# Patient Record
Sex: Female | Born: 1955 | Race: White | Hispanic: No | Marital: Married | Smoking: Former smoker
Health system: Southern US, Community
[De-identification: ages and names within clinical notes are randomized; demographics above are authoritative.]

## PROBLEM LIST (undated history)

## (undated) DIAGNOSIS — J189 Pneumonia, unspecified organism: Secondary | ICD-10-CM

## (undated) DIAGNOSIS — Z9221 Personal history of antineoplastic chemotherapy: Secondary | ICD-10-CM

## (undated) DIAGNOSIS — M1990 Unspecified osteoarthritis, unspecified site: Secondary | ICD-10-CM

## (undated) DIAGNOSIS — Z923 Personal history of irradiation: Secondary | ICD-10-CM

## (undated) DIAGNOSIS — J00 Acute nasopharyngitis [common cold]: Secondary | ICD-10-CM

## (undated) DIAGNOSIS — Z87442 Personal history of urinary calculi: Secondary | ICD-10-CM

## (undated) DIAGNOSIS — C801 Malignant (primary) neoplasm, unspecified: Secondary | ICD-10-CM

## (undated) DIAGNOSIS — D649 Anemia, unspecified: Secondary | ICD-10-CM

## (undated) DIAGNOSIS — E039 Hypothyroidism, unspecified: Secondary | ICD-10-CM

## (undated) DIAGNOSIS — E079 Disorder of thyroid, unspecified: Secondary | ICD-10-CM

## (undated) DIAGNOSIS — K219 Gastro-esophageal reflux disease without esophagitis: Secondary | ICD-10-CM

## (undated) DIAGNOSIS — R51 Headache: Secondary | ICD-10-CM

## (undated) DIAGNOSIS — I10 Essential (primary) hypertension: Secondary | ICD-10-CM

## (undated) DIAGNOSIS — M810 Age-related osteoporosis without current pathological fracture: Secondary | ICD-10-CM

## (undated) DIAGNOSIS — F329 Major depressive disorder, single episode, unspecified: Secondary | ICD-10-CM

## (undated) DIAGNOSIS — N189 Chronic kidney disease, unspecified: Secondary | ICD-10-CM

## (undated) DIAGNOSIS — C50919 Malignant neoplasm of unspecified site of unspecified female breast: Secondary | ICD-10-CM

## (undated) DIAGNOSIS — F32A Depression, unspecified: Secondary | ICD-10-CM

## (undated) DIAGNOSIS — E785 Hyperlipidemia, unspecified: Secondary | ICD-10-CM

## (undated) HISTORY — DX: Malignant (primary) neoplasm, unspecified: C80.1

## (undated) HISTORY — PX: TONSILLECTOMY: SUR1361

## (undated) HISTORY — DX: Disorder of thyroid, unspecified: E07.9

## (undated) HISTORY — DX: Essential (primary) hypertension: I10

## (undated) HISTORY — DX: Age-related osteoporosis without current pathological fracture: M81.0

## (undated) HISTORY — DX: Hyperlipidemia, unspecified: E78.5

## (undated) HISTORY — DX: Anemia, unspecified: D64.9

## (undated) HISTORY — DX: Personal history of irradiation: Z92.3

---

## 2000-01-15 ENCOUNTER — Encounter: Admission: RE | Admit: 2000-01-15 | Discharge: 2000-01-15 | Payer: Self-pay | Admitting: *Deleted

## 2000-01-15 ENCOUNTER — Encounter: Payer: Self-pay | Admitting: *Deleted

## 2003-09-14 ENCOUNTER — Encounter (INDEPENDENT_AMBULATORY_CARE_PROVIDER_SITE_OTHER): Payer: Self-pay | Admitting: Specialist

## 2003-09-14 ENCOUNTER — Ambulatory Visit (HOSPITAL_COMMUNITY): Admission: RE | Admit: 2003-09-14 | Discharge: 2003-09-14 | Payer: Self-pay | Admitting: *Deleted

## 2005-10-10 ENCOUNTER — Ambulatory Visit (HOSPITAL_COMMUNITY): Admission: RE | Admit: 2005-10-10 | Discharge: 2005-10-10 | Payer: Self-pay | Admitting: *Deleted

## 2005-10-10 ENCOUNTER — Encounter (INDEPENDENT_AMBULATORY_CARE_PROVIDER_SITE_OTHER): Payer: Self-pay | Admitting: Specialist

## 2006-03-17 HISTORY — PX: OTHER SURGICAL HISTORY: SHX169

## 2007-05-06 ENCOUNTER — Encounter: Payer: Self-pay | Admitting: Family Medicine

## 2007-06-15 ENCOUNTER — Ambulatory Visit: Payer: Self-pay | Admitting: Family Medicine

## 2007-06-15 ENCOUNTER — Telehealth: Payer: Self-pay | Admitting: Family Medicine

## 2007-06-15 DIAGNOSIS — K219 Gastro-esophageal reflux disease without esophagitis: Secondary | ICD-10-CM | POA: Insufficient documentation

## 2007-06-15 DIAGNOSIS — N200 Calculus of kidney: Secondary | ICD-10-CM | POA: Insufficient documentation

## 2007-06-15 DIAGNOSIS — M79609 Pain in unspecified limb: Secondary | ICD-10-CM | POA: Insufficient documentation

## 2007-06-15 DIAGNOSIS — Q828 Other specified congenital malformations of skin: Secondary | ICD-10-CM | POA: Insufficient documentation

## 2007-06-15 DIAGNOSIS — J309 Allergic rhinitis, unspecified: Secondary | ICD-10-CM | POA: Insufficient documentation

## 2007-06-15 DIAGNOSIS — B351 Tinea unguium: Secondary | ICD-10-CM | POA: Insufficient documentation

## 2007-06-15 DIAGNOSIS — J45909 Unspecified asthma, uncomplicated: Secondary | ICD-10-CM | POA: Insufficient documentation

## 2007-06-15 DIAGNOSIS — K279 Peptic ulcer, site unspecified, unspecified as acute or chronic, without hemorrhage or perforation: Secondary | ICD-10-CM | POA: Insufficient documentation

## 2007-06-15 DIAGNOSIS — I10 Essential (primary) hypertension: Secondary | ICD-10-CM | POA: Insufficient documentation

## 2007-06-15 DIAGNOSIS — M1990 Unspecified osteoarthritis, unspecified site: Secondary | ICD-10-CM | POA: Insufficient documentation

## 2007-06-29 ENCOUNTER — Ambulatory Visit: Payer: Self-pay | Admitting: Family Medicine

## 2007-07-02 ENCOUNTER — Telehealth: Payer: Self-pay | Admitting: Family Medicine

## 2007-07-12 ENCOUNTER — Ambulatory Visit: Payer: Self-pay | Admitting: Family Medicine

## 2007-07-12 LAB — CONVERTED CEMR LAB
BUN: 15 mg/dL (ref 6–23)
Chloride: 109 meq/L (ref 96–112)
GFR calc non Af Amer: 70 mL/min
Potassium: 4.9 meq/L (ref 3.5–5.1)

## 2007-08-16 ENCOUNTER — Ambulatory Visit: Payer: Self-pay | Admitting: Family Medicine

## 2007-08-16 DIAGNOSIS — R5383 Other fatigue: Secondary | ICD-10-CM

## 2007-08-16 DIAGNOSIS — R5381 Other malaise: Secondary | ICD-10-CM | POA: Insufficient documentation

## 2007-08-18 LAB — CONVERTED CEMR LAB
Alkaline Phosphatase: 64 units/L (ref 39–117)
Basophils Absolute: 0 10*3/uL (ref 0.0–0.1)
Basophils Relative: 0.3 % (ref 0.0–1.0)
Bilirubin, Direct: 0.1 mg/dL (ref 0.0–0.3)
Calcium: 9.7 mg/dL (ref 8.4–10.5)
FSH: 70.4 milliintl units/mL
Folate: 14.8 ng/mL
GFR calc Af Amer: 84 mL/min
GFR calc non Af Amer: 69 mL/min
HDL: 41.1 mg/dL (ref >39.0)
LH: 40.3 milliintl units/mL
Lymphocytes Relative: 19.6 % (ref 12.0–46.0)
MCHC: 35.7 g/dL (ref 30.0–36.0)
Neutrophils Relative %: 68.9 % (ref 43.0–77.0)
Platelets: 224 10*3/uL (ref 150–400)
Potassium: 4.5 meq/L (ref 3.5–5.1)
RBC: 4.72 M/uL (ref 3.87–5.11)
RDW: 12.8 % (ref 11.5–14.6)
Sed Rate: 12 mm/hr (ref 0–22)
Sodium: 141 meq/L (ref 135–145)
Total Bilirubin: 1 mg/dL (ref 0.3–1.2)
Triglycerides: 238 mg/dL (ref 0–149)
VLDL: 48 mg/dL — ABNORMAL HIGH (ref 0–40)
Vitamin B-12: 277 pg/mL (ref 211–911)

## 2007-08-23 ENCOUNTER — Ambulatory Visit: Payer: Self-pay | Admitting: Family Medicine

## 2007-08-26 DIAGNOSIS — R946 Abnormal results of thyroid function studies: Secondary | ICD-10-CM | POA: Insufficient documentation

## 2007-08-26 LAB — CONVERTED CEMR LAB
Free T4: 0.7 ng/dL (ref 0.6–1.6)
T3, Free: 3.3 pg/mL (ref 2.3–4.2)

## 2007-09-10 ENCOUNTER — Encounter: Payer: Self-pay | Admitting: Family Medicine

## 2007-09-15 ENCOUNTER — Encounter: Admission: RE | Admit: 2007-09-15 | Discharge: 2007-09-15 | Payer: Self-pay | Admitting: Endocrinology

## 2007-09-20 ENCOUNTER — Telehealth: Payer: Self-pay | Admitting: Family Medicine

## 2008-04-06 ENCOUNTER — Ambulatory Visit: Payer: Self-pay | Admitting: Family Medicine

## 2008-04-06 ENCOUNTER — Telehealth: Payer: Self-pay | Admitting: Family Medicine

## 2008-04-06 DIAGNOSIS — N63 Unspecified lump in unspecified breast: Secondary | ICD-10-CM | POA: Insufficient documentation

## 2008-04-10 ENCOUNTER — Encounter: Admission: RE | Admit: 2008-04-10 | Discharge: 2008-04-10 | Payer: Self-pay | Admitting: Family Medicine

## 2008-04-11 ENCOUNTER — Encounter (INDEPENDENT_AMBULATORY_CARE_PROVIDER_SITE_OTHER): Payer: Self-pay | Admitting: *Deleted

## 2008-05-02 ENCOUNTER — Ambulatory Visit: Payer: Self-pay | Admitting: Family Medicine

## 2008-05-02 DIAGNOSIS — R0789 Other chest pain: Secondary | ICD-10-CM | POA: Insufficient documentation

## 2008-05-11 ENCOUNTER — Encounter (INDEPENDENT_AMBULATORY_CARE_PROVIDER_SITE_OTHER): Payer: Self-pay | Admitting: *Deleted

## 2008-05-19 ENCOUNTER — Encounter: Payer: Self-pay | Admitting: Family Medicine

## 2008-07-05 ENCOUNTER — Ambulatory Visit: Payer: Self-pay | Admitting: Family Medicine

## 2008-07-06 ENCOUNTER — Telehealth: Payer: Self-pay | Admitting: Family Medicine

## 2008-07-07 ENCOUNTER — Ambulatory Visit: Payer: Self-pay | Admitting: Family Medicine

## 2008-07-17 ENCOUNTER — Telehealth: Payer: Self-pay | Admitting: Family Medicine

## 2008-07-31 ENCOUNTER — Ambulatory Visit: Payer: Self-pay | Admitting: Family Medicine

## 2008-08-22 ENCOUNTER — Telehealth: Payer: Self-pay | Admitting: Family Medicine

## 2008-08-25 ENCOUNTER — Telehealth: Payer: Self-pay | Admitting: Family Medicine

## 2008-09-05 ENCOUNTER — Telehealth: Payer: Self-pay | Admitting: Family Medicine

## 2008-09-06 ENCOUNTER — Ambulatory Visit: Payer: Self-pay | Admitting: Family Medicine

## 2008-09-25 ENCOUNTER — Telehealth: Payer: Self-pay | Admitting: Family Medicine

## 2008-10-09 ENCOUNTER — Telehealth: Payer: Self-pay | Admitting: Family Medicine

## 2008-11-06 ENCOUNTER — Telehealth: Payer: Self-pay | Admitting: Family Medicine

## 2008-11-13 ENCOUNTER — Telehealth: Payer: Self-pay | Admitting: Family Medicine

## 2009-01-08 ENCOUNTER — Telehealth: Payer: Self-pay | Admitting: Family Medicine

## 2009-01-09 ENCOUNTER — Ambulatory Visit: Payer: Self-pay | Admitting: Family Medicine

## 2009-02-12 ENCOUNTER — Telehealth: Payer: Self-pay | Admitting: Family Medicine

## 2009-02-22 ENCOUNTER — Telehealth: Payer: Self-pay | Admitting: Family Medicine

## 2009-06-25 ENCOUNTER — Encounter: Admission: RE | Admit: 2009-06-25 | Discharge: 2009-06-25 | Payer: Self-pay | Admitting: Family Medicine

## 2009-09-24 ENCOUNTER — Ambulatory Visit: Payer: Self-pay | Admitting: Family Medicine

## 2009-09-24 DIAGNOSIS — M1710 Unilateral primary osteoarthritis, unspecified knee: Secondary | ICD-10-CM

## 2009-09-24 DIAGNOSIS — IMO0002 Reserved for concepts with insufficient information to code with codable children: Secondary | ICD-10-CM | POA: Insufficient documentation

## 2009-10-19 ENCOUNTER — Telehealth: Payer: Self-pay | Admitting: Family Medicine

## 2009-11-09 ENCOUNTER — Telehealth: Payer: Self-pay | Admitting: Family Medicine

## 2010-01-10 ENCOUNTER — Ambulatory Visit: Payer: Self-pay | Admitting: Family Medicine

## 2010-01-14 ENCOUNTER — Ambulatory Visit: Payer: Self-pay | Admitting: Family Medicine

## 2010-01-17 ENCOUNTER — Ambulatory Visit: Payer: Self-pay | Admitting: Family Medicine

## 2010-01-21 ENCOUNTER — Ambulatory Visit: Payer: Self-pay | Admitting: Family Medicine

## 2010-01-24 ENCOUNTER — Ambulatory Visit: Payer: Self-pay | Admitting: Family Medicine

## 2010-01-28 ENCOUNTER — Ambulatory Visit: Payer: Self-pay | Admitting: Family Medicine

## 2010-04-07 ENCOUNTER — Encounter: Payer: Self-pay | Admitting: Family Medicine

## 2010-04-08 ENCOUNTER — Encounter: Payer: Self-pay | Admitting: Endocrinology

## 2010-04-15 ENCOUNTER — Telehealth: Payer: Self-pay | Admitting: Family Medicine

## 2010-04-15 ENCOUNTER — Encounter: Payer: Self-pay | Admitting: Family Medicine

## 2010-04-16 NOTE — Miscellaneous (Signed)
Summary: Waiver of Liability for Injection  Waiver of Liability for Injection   Imported By: Maryln Gottron 01/16/2010 15:59:45  _____________________________________________________________________  External Attachment:    Type:   Image     Comment:   External Document

## 2010-04-16 NOTE — Assessment & Plan Note (Signed)
Summary: SYNVISC INJECTION CYD   Vital Signs:  Patient profile:   55 year old female Height:      62.5 inches Weight:      208.0 pounds BMI:     37.57 Temp:     98.6 degrees F oral Pulse rate:   80 / minute Pulse rhythm:   regular BP sitting:   110 / 78  (left arm) Cuff size:   large  Vitals Entered By: Benny Lennert CMA Duncan Dull) (January 24, 2010 9:07 AM)  History of Present Illness: Chief complaint synvisc Injection  Left knee    Allergies: 1)  ! Codeine   Impression & Recommendations:  Problem # 1:  OSTEOARTHRITIS, KNEE, LEFT (ICD-715.96)  Knee Injection: Synvisc-3, LEFT Patient verbally consented to procedure. Risks, benefits, and alternatives explained. Sterilely prepped with betadine. Ethyl cholride used for anesthesia, then 5 cc of Lidocaine 1% used for anesthesia in the anterolateral position. Reprepped with Betadine.  Anterolateral approach without difficulty, injected with Synvisc-3, 2 mL (16 mg).  No complications with procedure and tolerated well.   Her updated medication list for this problem includes:    Tramadol Hcl 200 Mg Xr24h-tab (Tramadol hcl) .Marland Kitchen... Take one tablet every 6 hours as needed    Aspir-low 81 Mg Tbec (Aspirin) ..... One tablet daily  Orders: Joint Aspirate / Injection, Large (16109) Synvisc-Three (16 units) (U0454)  Complete Medication List: 1)  Lisinopril 20 Mg Tabs (Lisinopril) .... Take 1 tablet by mouth every 12 hours 2)  Glucosamine-chondroitin Caps (Glucosamine-chondroit-vit c-mn) .... Takes 2 tablets by mouth daily 3)  Synthroid 100 Mcg Tabs (Levothyroxine sodium) .... One tablet daily 4)  Pravastatin Sodium 80 Mg Tabs (Pravastatin sodium) .... Take 1 tablet by mouth once a day 5)  Cymbalta 60 Mg Cpep (Duloxetine hcl) .... Take 2 tablet by mouth once a day 6)  Voltaren 1 % Gel (Diclofenac sodium) .... Apply to knee 4 times daily as needed 7)  Tramadol Hcl 200 Mg Xr24h-tab (Tramadol hcl) .... Take one tablet every 6 hours as  needed 8)  Aspir-low 81 Mg Tbec (Aspirin) .... One tablet daily 9)  Vesicare 5 Mg Tabs (Solifenacin succinate) .... Take 1 tablet by mouth once a day 10)  Muscle Relaxer Pt Not Know Name    Orders Added: 1)  Joint Aspirate / Injection, Large [20610] 2)  Synvisc-Three (16 units) [J7325]    Current Allergies (reviewed today): ! CODEINE

## 2010-04-16 NOTE — Miscellaneous (Signed)
Summary: Waiver of Liability for Injection  Waiver of Liability for Injection   Imported By: Maryln Gottron 01/21/2010 14:13:07  _____________________________________________________________________  External Attachment:    Type:   Image     Comment:   External Document

## 2010-04-16 NOTE — Assessment & Plan Note (Signed)
Summary: SHOT/JRR   Vital Signs:  Patient profile:   55 year old female Height:      62.5 inches Weight:      208.0 pounds BMI:     37.57 Temp:     98.6 degrees F oral BP sitting:   110 / 80  (left arm) Cuff size:   large  Vitals Entered By: Benny Lennert CMA Duncan Dull) (January 28, 2010 3:45 PM)  History of Present Illness: Chief complaint synvisc injection  Allergies: 1)  ! Codeine   Impression & Recommendations:  Problem # 1:  OSTEOARTHRITIS, KNEE, RIGHT (ICD-715.96) Knee Injection: Synvisc-3< RIGHT Patient verbally consented to procedure. Risks, benefits, and alternatives explained. Sterilely prepped with betadine. Ethyl cholride used for anesthesia, then 5 cc of Lidocaine 1% used for anesthesia in the anterolateral position. Reprepped with Betadine.  Anterolateral approach without difficulty, injected with Synvisc-3, 2 mL. No complications with procedure and tolerated well.   Her updated medication list for this problem includes:    Tramadol Hcl 200 Mg Xr24h-tab (Tramadol hcl) .Marland Kitchen... Take one tablet every 6 hours as needed    Aspir-low 81 Mg Tbec (Aspirin) ..... One tablet daily  Complete Medication List: 1)  Lisinopril 20 Mg Tabs (Lisinopril) .... Take 1 tablet by mouth every 12 hours 2)  Glucosamine-chondroitin Caps (Glucosamine-chondroit-vit c-mn) .... Takes 2 tablets by mouth daily 3)  Synthroid 100 Mcg Tabs (Levothyroxine sodium) .... One tablet daily 4)  Pravastatin Sodium 80 Mg Tabs (Pravastatin sodium) .... Take 1 tablet by mouth once a day 5)  Cymbalta 60 Mg Cpep (Duloxetine hcl) .... Take 2 tablet by mouth once a day 6)  Voltaren 1 % Gel (Diclofenac sodium) .... Apply to knee 4 times daily as needed 7)  Tramadol Hcl 200 Mg Xr24h-tab (Tramadol hcl) .... Take one tablet every 6 hours as needed 8)  Aspir-low 81 Mg Tbec (Aspirin) .... One tablet daily 9)  Vesicare 5 Mg Tabs (Solifenacin succinate) .... Take 1 tablet by mouth once a day 10)  Muscle Relaxer Pt Not  Know Name   Other Orders: Joint Aspirate / Injection, Large (20610) Synvisc-Three (16 units) (E4540)   Orders Added: 1)  Joint Aspirate / Injection, Large [20610] 2)  Synvisc-Three (16 units) [J7325]    Current Allergies (reviewed today): ! CODEINE

## 2010-04-16 NOTE — Assessment & Plan Note (Signed)
Summary: CORTIZON INJECTION/DLO   Vital Signs:  Patient profile:   55 year old female Height:      62.5 inches Weight:      217.0 pounds BMI:     39.20 Temp:     98.8 degrees F oral Pulse rate:   80 / minute Pulse rhythm:   regular BP sitting:   110 / 72  (left arm) Cuff size:   large  Vitals Entered By: Benny Lennert CMA Duncan Dull) (September 24, 2009 3:29 PM)  History of Present Illness: Chief complaint cortison injection in both knees  next is very friendly and 55 year old female with advanced arthritis in both knees with the left knee worse than the right..  She actually saw rheumatology about 6 weeks ago, at that point he did do feel that her knees,, and now have him to review, just elevated at least the right knee will have severe degenerative joint changes.  At this point she does have significant left-sided knee pain it is flared up, and some additional right-sided knee pain.  B knee inj  REVIEW OF SYSTEMS  GEN: No systemic complaints, no fevers, chills, sweats, or other acute illnesses MSK: Detailed in the HPI GI: tolerating PO intake without difficulty Neuro: No numbness, parasthesias, or tingling associated. Otherwise the pertinent positives of the ROS are noted above.     Allergies: 1)  ! Codeine  Physical Exam  Additional Exam:  GEN: Well-developed,well-nourished,in no acute distress; alert,appropriate and cooperative throughout examination HEENT: Normocephalic and atraumatic without obvious abnormalities. No apparent alopecia or balding. Ears, externally no deformities PULM: Breathing comfortably in no respiratory distress EXT: No clubbing, cyanosis, or edema PSYCH: Normally interactive. Cooperative during the interview. Pleasant. Friendly and conversant. Not anxious or depressed appearing. Normal, full affect.   bilaterally exam: Full extension bilaterally, left knee limited on  flexion to approximately 105 and on the right to approximately 115. Stable MCL and  LCL and examination. Negative Lockman examination. Negative anterior and posterior drawer test. The Murray's test is negative. Flexion pinched test is positive on both sides.   Both sides there is actually well preserved patellar mobility with some pain on patellar facet compression.   Impression & Recommendations:  Problem # 1:  OSTEOARTHRITIS, KNEE, LEFT (ICD-715.96) Assessment Deteriorated  left greater than right deteriorated osteoarthritic  changes in  both knees.  Patient is on acceptable  nonoperative  interventions including Mobic and Ultram ER  considerable pain currently. This is providing some functional limitation.  Patient would be a good candidate for hyaluronic acid injections, which could be considered in the future as well.  No knee inj in 1 year - reasonable to inject her knees. She understands that all of these are temporary solutions, but not at TKA level.  Knee Injection, L Patient verbally consented to procedure. Risks, benefits, and alternatives explained. Sterilely prepped with betadine. Ethyl cholride used for anesthesia. 9 cc 0.5% Marcaine mixed with 1 cc of Kenalog 40 mg injected using the anterolateral approach without difficulty. No complications with procedure and tolerated well. Patient had decreased pain post-injection.   Her updated medication list for this problem includes:    Meloxicam 7.5 Mg Tabs (Meloxicam) .Marland Kitchen... 1 tab by mouth two times a day    Tramadol Hcl 200 Mg Xr24h-tab (Tramadol hcl) .Marland Kitchen... Take one tablet daily    Aspir-low 81 Mg Tbec (Aspirin) ..... One tablet daily  Orders: Joint Aspirate / Injection, Large (20610) Kenalog 10mg  (4units) (J3301) Joint Aspirate / Injection, Large (20610) Kenalog  10mg  (4units) (J3301)  Problem # 2:  OSTEOARTHRITIS, KNEE, RIGHT (ICD-715.96) Assessment: Deteriorated Knee Injection, R Patient verbally consented to procedure. Risks, benefits, and alternatives explained. Sterilely prepped with betadine. Ethyl  cholride used for anesthesia. 9 cc 0.5% Marcaine mixed with 1 cc of Kenalog 40 mg injected using the anterolateral approach without difficulty. No complications with procedure and tolerated well. Patient had decreased pain post-injection.   Her updated medication list for this problem includes:    Meloxicam 7.5 Mg Tabs (Meloxicam) .Marland Kitchen... 1 tab by mouth two times a day    Tramadol Hcl 200 Mg Xr24h-tab (Tramadol hcl) .Marland Kitchen... Take one tablet daily    Aspir-low 81 Mg Tbec (Aspirin) ..... One tablet daily  Complete Medication List: 1)  Lisinopril 20 Mg Tabs (Lisinopril) .... Take 1 tablet by mouth every 12 hours 2)  Glucosamine-chondroitin Caps (Glucosamine-chondroit-vit c-mn) .... Takes 2 tablets by mouth daily 3)  Meloxicam 7.5 Mg Tabs (Meloxicam) .Marland Kitchen.. 1 tab by mouth two times a day 4)  Synthroid 100 Mcg Tabs (Levothyroxine sodium) .... One tablet daily 5)  Pravastatin Sodium 80 Mg Tabs (Pravastatin sodium) .... Take 1 tablet by mouth once a day 6)  Cymbalta 60 Mg Cpep (Duloxetine hcl) .... Take 2 tablet by mouth once a day 7)  Voltaren 1 % Gel (Diclofenac sodium) .... Apply to knee 4 times daily as needed 8)  Tramadol Hcl 200 Mg Xr24h-tab (Tramadol hcl) .... Take one tablet daily 9)  Aspir-low 81 Mg Tbec (Aspirin) .... One tablet daily  Current Allergies (reviewed today): ! CODEINE

## 2010-04-16 NOTE — Progress Notes (Signed)
Summary: refill request for zolpidem  Phone Note Refill Request Message from:  Fax from Pharmacy  Refills Requested: Medication #1:  zolpidem tartrate 10 mg   Last Refilled: 08/22/2008 Faxed request from [facility] [address], this is no longer on med list. [phone].  Initial call taken by: Lowella Petties CMA,  October 19, 2009 9:54 AM  Follow-up for Phone Call        OVer due for CPX.Marland Kitchenneeds to be seen  for CPXprior to refills.   LAbs prior Dx 401.1 CMET, lipids, TSH Follow-up by: Kerby Nora MD,  October 19, 2009 9:58 AM  Additional Follow-up for Phone Call Additional follow up Details #1::        Patient and pharmacy advised patient needs appt before furthur refills.Consuello Masse CMA   Additional Follow-up by: Benny Lennert CMA Duncan Dull),  October 19, 2009 10:58 AM

## 2010-04-16 NOTE — Miscellaneous (Signed)
Summary: Waiver of Liability Form,Synvisc Injection  Waiver of Liability Form,Synvisc Injection   Imported By: Beau Fanny 01/28/2010 16:38:00  _____________________________________________________________________  External Attachment:    Type:   Image     Comment:   External Document

## 2010-04-16 NOTE — Assessment & Plan Note (Signed)
Summary: SYNVISC INJECTION  CYD   Vital Signs:  Patient profile:   55 year old female Height:      62.5 inches Weight:      217.0 pounds BMI:     39.20 Temp:     98.9 degrees F oral Pulse rate:   80 / minute Pulse rhythm:   regular BP sitting:   110 / 80  (left arm) Cuff size:   large  Vitals Entered By: Benny Lennert CMA Duncan Dull) (January 14, 2010 9:09 AM)  History of Present Illness: Chief complaint synvisc Injection  Allergies: 1)  ! Codeine   Impression & Recommendations:  Problem # 1:  OSTEOARTHRITIS, KNEE, RIGHT (ICD-715.96) Knee Injection: Synvisc-3, RIGHT KNEE Patient verbally consented to procedure. Risks, benefits, and alternatives explained. Sterilely prepped with betadine. Ethyl cholride used for anesthesia, then 5 cc of Lidocaine 1% used for anesthesia in the anterolateral position. Reprepped with Betadine.  Anterolateral approach without difficulty, injected with Synvisc-3, 2 mL. No complications with procedure and tolerated well.   Her updated medication list for this problem includes:    Tramadol Hcl 200 Mg Xr24h-tab (Tramadol hcl) .Marland Kitchen... Take one tablet every 6 hours as needed    Aspir-low 81 Mg Tbec (Aspirin) ..... One tablet daily  Orders: Joint Aspirate / Injection, Large (16109) Synvisc-Three (16 units) (U0454)  Complete Medication List: 1)  Lisinopril 20 Mg Tabs (Lisinopril) .... Take 1 tablet by mouth every 12 hours 2)  Glucosamine-chondroitin Caps (Glucosamine-chondroit-vit c-mn) .... Takes 2 tablets by mouth daily 3)  Synthroid 100 Mcg Tabs (Levothyroxine sodium) .... One tablet daily 4)  Pravastatin Sodium 80 Mg Tabs (Pravastatin sodium) .... Take 1 tablet by mouth once a day 5)  Cymbalta 60 Mg Cpep (Duloxetine hcl) .... Take 2 tablet by mouth once a day 6)  Voltaren 1 % Gel (Diclofenac sodium) .... Apply to knee 4 times daily as needed 7)  Tramadol Hcl 200 Mg Xr24h-tab (Tramadol hcl) .... Take one tablet every 6 hours as needed 8)  Aspir-low 81  Mg Tbec (Aspirin) .... One tablet daily 9)  Vesicare 5 Mg Tabs (Solifenacin succinate) .... Take 1 tablet by mouth once a day 10)  Muscle Relaxer Pt Not Know Name   Patient Instructions: 1)  f/u thursday with me.   Orders Added: 1)  Joint Aspirate / Injection, Large [20610] 2)  Synvisc-Three (16 units) [J7325]    Current Allergies (reviewed today): ! CODEINE

## 2010-04-16 NOTE — Miscellaneous (Signed)
Summary: Waiver of Liability Form  Waiver of Liability Form   Imported By: Beau Fanny 01/10/2010 09:36:43  _____________________________________________________________________  External Attachment:    Type:   Image     Comment:   External Document

## 2010-04-16 NOTE — Progress Notes (Signed)
Summary: wants silicone injections  Phone Note Call from Patient Call back at Work Phone [phone]   Caller: Patient Call For: Dr. Patsy Lager Summary of Call: Pt states she is ready to procede with silicone injections in both knees and she is asking that we call her insurance company to see if this is covered.  She has [facility], phone is [phone], ID # [id number]. Initial call taken by: Lowella Petties CMA,  November 09, 2009 8:37 AM  Follow-up for Phone Call        Aram Beecham, can we check her coverage for Synvisc-3 injection series. (Bilateral 3 shot Synvisc series, [employer] should cover, but we need to check before ordering 2000 worth of medicine) Follow-up by: Hannah Beat MD,  November 09, 2009 8:46 AM

## 2010-04-16 NOTE — Assessment & Plan Note (Signed)
Summary: SYNVISC INJECTION CYD   Vital Signs:  Patient profile:   55 year old female Height:      62.5 inches Weight:      212.0 pounds BMI:     38.30 Temp:     98.8 degrees F oral Pulse rate:   80 / minute Pulse rhythm:   regular BP sitting:   110 / 80  (left arm) Cuff size:   large  Vitals Entered By: Benny Lennert CMA Duncan Dull) (January 21, 2010 9:26 AM)  History of Present Illness: Chief complaint synvisc Injection  Right knee:   Allergies: 1)  ! Codeine   Impression & Recommendations:  Problem # 1:  OSTEOARTHRITIS, KNEE, RIGHT (ICD-715.96) Knee Injection: Synvisc-3, RIGHT Patient verbally consented to procedure. Risks, benefits, and alternatives explained. Sterilely prepped with betadine. Ethyl cholride used for anesthesia, then 5 cc of Lidocaine 1% used for anesthesia in the anterolateral position. Reprepped with Betadine.  Anterolateral approach without difficulty, injected with Synvisc-3, 2 mL. No complications with procedure and tolerated well.   Her updated medication list for this problem includes:    Tramadol Hcl 200 Mg Xr24h-tab (Tramadol hcl) .Marland Kitchen... Take one tablet every 6 hours as needed    Aspir-low 81 Mg Tbec (Aspirin) ..... One tablet daily  Complete Medication List: 1)  Lisinopril 20 Mg Tabs (Lisinopril) .... Take 1 tablet by mouth every 12 hours 2)  Glucosamine-chondroitin Caps (Glucosamine-chondroit-vit c-mn) .... Takes 2 tablets by mouth daily 3)  Synthroid 100 Mcg Tabs (Levothyroxine sodium) .... One tablet daily 4)  Pravastatin Sodium 80 Mg Tabs (Pravastatin sodium) .... Take 1 tablet by mouth once a day 5)  Cymbalta 60 Mg Cpep (Duloxetine hcl) .... Take 2 tablet by mouth once a day 6)  Voltaren 1 % Gel (Diclofenac sodium) .... Apply to knee 4 times daily as needed 7)  Tramadol Hcl 200 Mg Xr24h-tab (Tramadol hcl) .... Take one tablet every 6 hours as needed 8)  Aspir-low 81 Mg Tbec (Aspirin) .... One tablet daily 9)  Vesicare 5 Mg Tabs (Solifenacin  succinate) .... Take 1 tablet by mouth once a day 10)  Muscle Relaxer Pt Not Know Name   Other Orders: Joint Aspirate / Injection, Large (20610) Synvisc-Three (16 units) (Z6109)   Orders Added: 1)  Joint Aspirate / Injection, Large [20610] 2)  Synvisc-Three (16 units) [J7325]    Current Allergies (reviewed today): ! CODEINE

## 2010-04-16 NOTE — Assessment & Plan Note (Signed)
Summary: INJECTION OF 1 KNEE CYD   Vital Signs:  Patient profile:   55 year old female Height:      62.5 inches Weight:      216.50 pounds BMI:     39.11 Temp:     98.5 degrees F oral Pulse rate:   80 / minute Pulse rhythm:   regular BP sitting:   122 / 76  (left arm) Cuff size:   large  Vitals Entered By: Lewanda Rife LPN (January 10, 2010 9:21 AM) CC: knee injection   Allergies: 1)  ! Codeine   Impression & Recommendations:  Problem # 1:  OSTEOARTHRITIS, KNEE, LEFT (ICD-715.96)  Knee Injection: Synvisc-3, L Patient verbally consented to procedure. Risks, benefits, and alternatives explained. Sterilely prepped with betadine. Ethyl cholride used for anesthesia, then 5 cc of Lidocaine 1% used for anesthesia in the anterolateral position. Reprepped with Betadine.  Anterolateral approach without difficulty, injected with Synvisc-3, 2 mL. No complications with procedure and tolerated well.   The following medications were removed from the medication list:    Meloxicam 7.5 Mg Tabs (Meloxicam) .Marland Kitchen... 1 tab by mouth two times a day Her updated medication list for this problem includes:    Tramadol Hcl 200 Mg Xr24h-tab (Tramadol hcl) .Marland Kitchen... Take one tablet every 6 hours as needed    Aspir-low 81 Mg Tbec (Aspirin) ..... One tablet daily  Orders: Joint Aspirate / Injection, Large (79892) Synvisc-Three (16 units) (J1941)  Complete Medication List: 1)  Lisinopril 20 Mg Tabs (Lisinopril) .... Take 1 tablet by mouth every 12 hours 2)  Glucosamine-chondroitin Caps (Glucosamine-chondroit-vit c-mn) .... Takes 2 tablets by mouth daily 3)  Synthroid 100 Mcg Tabs (Levothyroxine sodium) .... One tablet daily 4)  Pravastatin Sodium 80 Mg Tabs (Pravastatin sodium) .... Take 1 tablet by mouth once a day 5)  Cymbalta 60 Mg Cpep (Duloxetine hcl) .... Take 2 tablet by mouth once a day 6)  Voltaren 1 % Gel (Diclofenac sodium) .... Apply to knee 4 times daily as needed 7)  Tramadol Hcl 200 Mg Xr24h-tab  (Tramadol hcl) .... Take one tablet every 6 hours as needed 8)  Aspir-low 81 Mg Tbec (Aspirin) .... One tablet daily 9)  Vesicare 5 Mg Tabs (Solifenacin succinate) .... Take 1 tablet by mouth once a day 10)  Muscle Relaxer Pt Not Know Name    Orders Added: 1)  Joint Aspirate / Injection, Large [20610] 2)  Synvisc-Three (16 units) [J7325]   Immunization History:  Influenza Immunization History:    Influenza:  historical (12/08/2009)   Immunization History:  Influenza Immunization History:    Influenza:  Historical (12/08/2009)  Current Allergies (reviewed today): ! CODEINE

## 2010-04-16 NOTE — Assessment & Plan Note (Signed)
Summary: ROA FOR FOLLOW-UP/JRR   Vital Signs:  Patient profile:   55 year old female Height:      62.5 inches Weight:      214.0 pounds BMI:     38.66 Temp:     98.6 degrees F oral Pulse rate:   80 / minute Pulse rhythm:   regular BP sitting:   110 / 78  (left arm) Cuff size:   large  Vitals Entered By: Benny Lennert CMA Duncan Dull) (January 17, 2010 9:38 AM)  History of Present Illness: Chief complaint follow up injection in left  knee  Allergies: 1)  ! Codeine   Impression & Recommendations:  Problem # 1:  OSTEOARTHRITIS, KNEE, LEFT (ICD-715.96)  Knee Injection: Synvisc-3, LEFT Patient verbally consented to procedure. Risks, benefits, and alternatives explained. Sterilely prepped with betadine. Ethyl cholride used for anesthesia, then 5 cc of Lidocaine 1% used for anesthesia in the anterolateral position. Reprepped with Betadine.  Anterolateral approach without difficulty, injected with Synvisc-3, 2 mL. No complications with procedure and tolerated well.   Her updated medication list for this problem includes:    Tramadol Hcl 200 Mg Xr24h-tab (Tramadol hcl) .Marland Kitchen... Take one tablet every 6 hours as needed    Aspir-low 81 Mg Tbec (Aspirin) ..... One tablet daily  Orders: Joint Aspirate / Injection, Large (04540) Synvisc-Three (16 units) (J8119)  Complete Medication List: 1)  Lisinopril 20 Mg Tabs (Lisinopril) .... Take 1 tablet by mouth every 12 hours 2)  Glucosamine-chondroitin Caps (Glucosamine-chondroit-vit c-mn) .... Takes 2 tablets by mouth daily 3)  Synthroid 100 Mcg Tabs (Levothyroxine sodium) .... One tablet daily 4)  Pravastatin Sodium 80 Mg Tabs (Pravastatin sodium) .... Take 1 tablet by mouth once a day 5)  Cymbalta 60 Mg Cpep (Duloxetine hcl) .... Take 2 tablet by mouth once a day 6)  Voltaren 1 % Gel (Diclofenac sodium) .... Apply to knee 4 times daily as needed 7)  Tramadol Hcl 200 Mg Xr24h-tab (Tramadol hcl) .... Take one tablet every 6 hours as needed 8)   Aspir-low 81 Mg Tbec (Aspirin) .... One tablet daily 9)  Vesicare 5 Mg Tabs (Solifenacin succinate) .... Take 1 tablet by mouth once a day 10)  Muscle Relaxer Pt Not Know Name    Orders Added: 1)  Joint Aspirate / Injection, Large [20610] 2)  Synvisc-Three (16 units) [J7325]    Current Allergies (reviewed today): ! CODEINE

## 2010-04-19 NOTE — Miscellaneous (Signed)
Summary: Waiver of Liability for Injection  Waiver of Liability for Injection   Imported By: Maryln Gottron 01/31/2010 12:15:37  _____________________________________________________________________  External Attachment:    Type:   Image     Comment:   External Document

## 2010-04-24 NOTE — Progress Notes (Signed)
Summary: [facility]  Phone Note Other Incoming   Details for Reason: [facility] Summary of Call: Notify pt she is overdue for CPX and fasting labs prior if not done elsewhere. Please schedule. Initial call taken by: Kerby Nora MD,  April 15, 2010 10:22 AM  Follow-up for Phone Call        Left message on machine for patient to call and schedule this appt at her earliest convience.Consuello Masse CMA   Follow-up by: Benny Lennert CMA Duncan Dull),  April 15, 2010 10:31 AM

## 2010-06-03 ENCOUNTER — Ambulatory Visit (INDEPENDENT_AMBULATORY_CARE_PROVIDER_SITE_OTHER): Payer: 59 | Admitting: Family Medicine

## 2010-06-03 ENCOUNTER — Encounter: Payer: Self-pay | Admitting: Family Medicine

## 2010-06-03 DIAGNOSIS — R21 Rash and other nonspecific skin eruption: Secondary | ICD-10-CM | POA: Insufficient documentation

## 2010-06-13 NOTE — Assessment & Plan Note (Signed)
Summary: ?SHINGLES/CLE [facility]   Vital Signs:  Patient profile:   55 year old female Weight:      218 pounds Temp:     98.3 degrees F oral Pulse rate:   76 / minute Pulse rhythm:   regular BP sitting:   124 / 78  (left arm) Cuff size:   large  Vitals Entered By: Selena Batten Dance CMA Duncan Dull) (June 03, 2010 9:09 AM) CC: ? Shingles   History of Present Illness: CC: rash  monday of last week, started having R sided headache.  Pain achey, then and stabbing.  no vomiting, mild queasiness.  No vision changes.  No fevers/chills.  + photophobia.  tylenol didn't help HA.  no joint pains outside norm or spots inside mouth.    Wednesday morning had rash forehead.  over next few days started having rash come up over forehead and R scalp.  pain burning  Never had anything like this before.  no one else around her had similar rash.  no recent trip outside of country. +h/o chicken pox. +h/o vertigo.  not taking vesicare or cymbalta.  did recently start phentermine 2 wks ago per Dr. Juleen China (stopped this medicine last few days to see if causing, no improvement in rash)  Current Medications (verified): 1)  Lisinopril 20 Mg  Tabs (Lisinopril) .... Take 1 Tablet By Mouth Every 12 Hours 2)  Glucosamine-Chondroitin   Caps (Glucosamine-Chondroit-Vit C-Mn) .... Takes 2 Tablets By Mouth Daily 3)  Synthroid 100 Mcg Tabs (Levothyroxine Sodium) .... One Tablet Daily 4)  Pravastatin Sodium 80 Mg Tabs (Pravastatin Sodium) .... Take 1 Tablet By Mouth Once A Day 5)  Voltaren 1 %  Gel (Diclofenac Sodium) .... Apply To Knee 4 Times Daily As Needed 6)  Tramadol Hcl 200 Mg Xr24h-Tab (Tramadol Hcl) .... Take One Nightly For Oa 7)  Aspir-Low 81 Mg Tbec (Aspirin) .... One Tablet Daily 8)  Flexeril 5 Mg Tabs (Cyclobenzaprine Hcl) .Marland Kitchen.. 1 By Mouth Three Times A Day As Needed 9)  Phentermine Hcl 30 Mg Caps (Phentermine Hcl) .... Per Dr. Juleen China  Allergies: 1)  ! Codeine  Past History:  Past Medical History: Last updated:  06/15/2007 Osteoarthritis Hypertension GERD Asthma, mild intermittant Allergic rhinitis Peptic ulcer disease  Family History: Last updated: 06/15/2007 father: kidney stones, MI age 33 mother: lupus only child PGM: mouth cancer, CAD MGM: Connective tissue do MGF: CAD  Social History: Last updated: 06/15/2007 Occupation: clean houses  Married 2 kids Never Smoked Alcohol use-no Drug use-no Regular exercise-yes, walks 45 min daily Diet: loves carbs, chicken, tuna, loves diet soda  Review of Systems       per HPI  Physical Exam  General:  Well-developed,well-nourished,in no acute distress; alert,appropriate and cooperative throughout examination Head:  NCAT Eyes:  No corneal or conjunctival inflammation noted. EOMI. Perrla.  cornea clear.  limited funduscopic exam, no exudate, papilledema. Ears:  TMs clear bilaterally Nose:  nares clear bilaterally Mouth:  MMM, no pharyngeal erythema/exudates Neck:  no LAD Lungs:  Normal respiratory effort, chest expands symmetrically. Lungs are clear to auscultation, no crackles or wheezes. Heart:  Normal rate and regular rhythm. S1 and S2 normal without gallop, murmur, click, rub or other extra sounds. Skin:  R forehead with inflammatory papules with surrounding erythema, tender to palpation.  One over R brow, one R temple, one R hairline, a few R upper parietal scalp no vesicles.   Impression & Recommendations:  Problem # 1:  SKIN RASH (ICD-782.1) R forehead, prodrome of HA. ?  atypical shingles (not vesicular) treat as such with acyclovir, esp as along V1 distribution.  discussed ophtho referral, pt opts to wait.  will let us know immediately if any vision changes for urgent ophtho referral.  Complete Medication List: 1)  Lisinopril 20 Mg Tabs (Lisinopril) .... Take 1 tablet by mouth every 12 hours 2)  Glucosamine-chondroitin Caps (Glucosamine-chondroit-vit c-mn) .... Takes 2 tablets by mouth daily 3)  Synthroid 100 Mcg Tabs  (Levothyroxine sodium) .... One tablet daily 4)  Pravastatin Sodium 80 Mg Tabs (Pravastatin sodium) .... Take 1 tablet by mouth once a day 5)  Voltaren 1 % Gel (Diclofenac sodium) .... Apply to knee 4 times daily as needed 6)  Tramadol Hcl 200 Mg Xr24h-tab (Tramadol hcl) .... Take one nightly for oa 7)  Aspir-low 81 Mg Tbec (Aspirin) .... One tablet daily 8)  Flexeril 5 Mg Tabs (Cyclobenzaprine hcl) .Marland Kitchen.. 1 by mouth three times a day as needed 9)  Phentermine Hcl 30 Mg Caps (Phentermine hcl) .... Per dr. Juleen China 10)  Acyclovir 800 Mg Tabs (Acyclovir) .... Take one by mouth 5 times a day for 7 days  Patient Instructions: 1)  We think this may be shingles.  2)  treat with anti viral. 3)  let us know if any worsening rash or pain.  If any vision changes, you need to let us know to get you in to see eye doctor right away. Prescriptions: ACYCLOVIR 800 MG TABS (ACYCLOVIR) take one by mouth 5 times a day for 7 days  #35 x 0   Entered and Authorized by:   Eustaquio Boyden  MD   Signed by:   Eustaquio Boyden  MD on 06/03/2010   Method used:   Electronically to        [facility]  [address] (retail)       [address]       [place], [place]  [address]       Ph: [phone] or [phone]       Fax: [phone]   RxID:   [id number]    Orders Added: 1)  Est. Patient Level III [32355]    Current Allergies (reviewed today): ! CODEINE

## 2010-08-02 NOTE — Op Note (Signed)
NAME:  Sydney Ford, Sydney Ford                         ACCOUNT NO.:  [id number]   MEDICAL RECORD NO.:  [id number]                   PATIENT TYPE:  AMB   LOCATION:  SDC                                  [facility]ILITY:  [facility]   PHYSICIAN:  [place] B. Earlene Plater, M.D.               DATE OF BIRTH:  10/14/1955   DATE OF PROCEDURE:  09/14/2003  DATE OF DISCHARGE:                                 OPERATIVE REPORT   PREOPERATIVE DIAGNOSES:  Abnormal uterine bleeding, prolapse submucosal  fibroid.   POSTOPERATIVE DIAGNOSES:  Abnormal uterine bleeding, prolapse submucosal  fibroid.   PROCEDURE:  Exam under anesthesia, removal of submucosal fibroid,  hysteroscopy D&C with polyp removal.   SURGEON:  [place] B. Earlene Plater, M.D.   ANESTHESIA:  LMA   FINDINGS:  A 5 cm prolapsed submucous fibroid, shaggy endometrium on  hysteroscopy and 2 or 3 areas that appear consistent with endometrial  polyps.   FLUID DEFICIT:  25 mL sorbitol.   ESTIMATED BLOOD LOSS:  50 mL.   COMPLICATIONS:  None.   INDICATIONS FOR PROCEDURE:  The patient presented from Dr. Stephannie Peters  office with a 2 or 3 month history of heavy vaginal bleeding with associated  fist size clots and was noted to be anemic in the 7 range on recent  hemoglobin.  Was seen in the office yesterday and on pelvic exam was noted  to have a prolapsed submucosal fibroid. Given her severe anemia and the  obvious clinical findings, she presents for operative removal and further  diagnosis and treatment.   The patient was advised of the risks of surgery including infection,  bleeding, uterine perforation, damage to surrounding organs.   DESCRIPTION OF PROCEDURE:  The patient was taken to the operating room and  LMA general anesthesia obtained.  She was prepped and draped in a standard  fashion, bladder emptied with a red rubber catheter.  Examination under  anesthesia showed findings as outlined above. A weighted speculum inserted  and the fibroid grasped with a tooth  tenaculum.  It was fairly mobile with a  broad stalk approximately 2 cm in width at the stalk.  Prolene endoloops  were passed around the fibroid and cinched down for hemostasis. The fibroid  was injected with diluted Pitressin.  Despite good retraction, the base  could not be well visualized to safely resect the specimen in one piece. I  therefore morcellated the fibroid with a scalpel down to the base.  The  sutures came off along with the base.  It was inspected and was hemostatic.   The cervix was probed with a sound and the uterus was found to be anteverted  and normal size.  A paracervical block was placed with 20 mL of 1%  Nesacaine.  The cervix was gently dilated to a #21 to allow passage of the  diagnostic hysteroscope. This was inserted after being flushed with sorbitol  and the cavity inspected  with good uterine distention obtained.   The endometrium overall appeared shaggy and there were 2 or 3 areas that  looked like polyps.  These were removed with the Randall stone forceps. The  endometrium was then gently curetted and green texture noted throughout.  There was no active bleeding within the endometrium. The site of the fibroid  was again inspected and was hemostatic.   As there was no other active bleeding and no other pathology identified, the  procedure was terminated.   The patient tolerated the procedure well and there were no complications.  She was taken to the recovery room awake, alert in stable condition. All  counts were correct per the operating room staff.                                               Gerri Spore B. Earlene Plater, M.D.    WBD/MEDQ  D:  09/14/2003  T:  09/15/2003  Job:  [id number]   cc:   Talmadge Coventry, M.D.  [address]  [place]  [place] [address]  Fax: [phone]

## 2010-08-02 NOTE — Op Note (Signed)
NAMENOELIA, Sydney Ford               ACCOUNT NO.:  [id number]   MEDICAL RECORD NO.:  [id number]          PATIENT TYPE:  AMB   LOCATION:  SDC                           [facility]   PHYSICIAN:  [place] B. Earlene Plater, M.D.  DATE OF BIRTH:  10/04/1955   DATE OF PROCEDURE:  10/10/2005  DATE OF DISCHARGE:                                 OPERATIVE REPORT   PREOPERATIVE DIAGNOSIS:  Menorrhagia.   POSTOPERATIVE DIAGNOSIS:  Menorrhagia.   PROCEDURE:  Hysteroscopy and dilatation and curettage, NovaSure endometrial  ablation.   SURGEON:  Dr. Marina Gravel.   ASSISTANT:  None.   ANESTHESIA:  LMA general and 20 mL of 1% Nesacaine paracervical block.   SPECIMENS:  Endometrial curettings submitted to Pathology.   BLOOD LOSS:  Minimal.   FLUID DEFICIT:  85 mL lactated Ringer's.   COMPLICATIONS:  None.   INDICATIONS:  The patient with a history of heavy menstrual bleeding  requiring continuous Aygestin therapy.  History of previous prolapsed myoma.  Recent sonohystogram in the office showed no focal mass and preop  endometrial biopsy was benign.  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  Options discussed including hysterectomy versus NovaSure or Mirena  IUD.  The patient ultimately decided to proceed with NovaSure to minimize  recovery time and minimize invasiveness but proceed with a definitive  procedure.  The patient was advised of the risks of surgery including  infection, bleeding, uterine perforation, injury to tissue and surrounding  organs, fluid overload and approximately 15% chance of unsatisfactory  results.   PROCEDURE:  The patient was taken to the operating room and LMA general  anesthesia obtained.  She was prepped and draped in the standard fashion.  Bladder emptied with in and out cath.  Uterus was sounded to 10 cm.  The  cervical length was estimated at 4.5 cm and cervix dilated to #15.  The  diagnostic hysteroscope was inserted after being flushed with Ringer's.  Good uterine distention obtained.  Cavity showed no focal mass but an  overall proliferative appearance.  The endometrium was gently curetted and  specimen submitted to Pathology.   The cervix was dilated up to a #23 and the NovaSure device inserted to the  fundus and the Array deployed in standard fashion.  The cavity width was 5  cm.   The CO2 test was performed and passed.  The endometrium was ablated for 1  minute and 50 seconds.   The Array was retracted, device removed and hysteroscope re-inserted and  cavity inspected.  Good coverage obtained.  No active bleeding.   The instruments were removed, cervix hemostatic. The patient tolerated the  procedure without complications.  She was taken to recovery room awake,  alert in stable condition.      Gerri Spore B. Earlene Plater, M.D.  Electronically Signed     WBD/MEDQ  D:  10/10/2005  T:  10/10/2005  Job:  [id number]

## 2010-12-02 ENCOUNTER — Other Ambulatory Visit: Payer: Self-pay | Admitting: *Deleted

## 2010-12-02 MED ORDER — DICLOFENAC SODIUM 1 % TD GEL: 1.0000 "application " | Freq: Four times a day (QID) | TRANSDERMAL | Status: DC

## 2012-03-21 DIAGNOSIS — J00 Acute nasopharyngitis [common cold]: Secondary | ICD-10-CM

## 2012-03-21 HISTORY — DX: Acute nasopharyngitis (common cold): J00

## 2013-01-26 ENCOUNTER — Other Ambulatory Visit: Payer: Self-pay

## 2013-01-26 DIAGNOSIS — Z1231 Encounter for screening mammogram for malignant neoplasm of breast: Secondary | ICD-10-CM

## 2013-02-08 ENCOUNTER — Ambulatory Visit
Admission: RE | Admit: 2013-02-08 | Discharge: 2013-02-08 | Disposition: A | Discharge status: Home / Self Care | Payer: BC Managed Care – PPO | Source: Ambulatory Visit

## 2013-02-08 DIAGNOSIS — Z1231 Encounter for screening mammogram for malignant neoplasm of breast: Secondary | ICD-10-CM

## 2013-02-11 ENCOUNTER — Other Ambulatory Visit: Payer: Self-pay | Admitting: Endocrinology

## 2013-02-11 DIAGNOSIS — R928 Other abnormal and inconclusive findings on diagnostic imaging of breast: Secondary | ICD-10-CM

## 2013-02-28 ENCOUNTER — Other Ambulatory Visit: Payer: Self-pay | Admitting: Endocrinology

## 2013-02-28 ENCOUNTER — Ambulatory Visit
Admission: RE | Admit: 2013-02-28 | Discharge: 2013-02-28 | Disposition: A | Discharge status: Home / Self Care | Payer: BC Managed Care – PPO | Source: Ambulatory Visit | Attending: Endocrinology | Admitting: Endocrinology

## 2013-02-28 DIAGNOSIS — R928 Other abnormal and inconclusive findings on diagnostic imaging of breast: Secondary | ICD-10-CM

## 2013-02-28 DIAGNOSIS — N6310 Unspecified lump in the right breast, unspecified quadrant: Secondary | ICD-10-CM

## 2013-02-28 DIAGNOSIS — C50919 Malignant neoplasm of unspecified site of unspecified female breast: Secondary | ICD-10-CM

## 2013-02-28 HISTORY — DX: Malignant neoplasm of unspecified site of unspecified female breast: C50.919

## 2013-03-01 ENCOUNTER — Other Ambulatory Visit: Payer: Self-pay | Admitting: Endocrinology

## 2013-03-01 DIAGNOSIS — C50919 Malignant neoplasm of unspecified site of unspecified female breast: Secondary | ICD-10-CM

## 2013-03-07 ENCOUNTER — Telehealth (INDEPENDENT_AMBULATORY_CARE_PROVIDER_SITE_OTHER): Payer: Self-pay

## 2013-03-07 ENCOUNTER — Telehealth: Payer: Self-pay | Admitting: *Deleted

## 2013-03-07 NOTE — Telephone Encounter (Signed)
Called and spoke with patient.  Contact information given.  No questions or concerns at this time.

## 2013-03-07 NOTE — Telephone Encounter (Signed)
Returned pt's call. The pt wanted to know if Dr Dwain Sarna would still see her before she get's her breast MRI b/c the pt is really sick with a cold now. The pt doesn't know if she is going to be able to sit still for . In the mri machine so she is going to prob. Get the mri r/s to another date if ok with Dr Dwain Sarna. I checked with Dr Dwain Sarna and he said he would still see pt without the MRI being done first. The pt understands.

## 2013-03-08 ENCOUNTER — Ambulatory Visit
Admission: RE | Admit: 2013-03-08 | Discharge: 2013-03-08 | Disposition: A | Discharge status: Home / Self Care | Payer: BC Managed Care – PPO | Source: Ambulatory Visit | Attending: Endocrinology | Admitting: Endocrinology

## 2013-03-08 DIAGNOSIS — C50919 Malignant neoplasm of unspecified site of unspecified female breast: Secondary | ICD-10-CM

## 2013-03-08 MED ORDER — GADOBENATE DIMEGLUMINE 529 MG/ML IV SOLN
20.0000 mL | Freq: Once | INTRAVENOUS | Status: AC | PRN
  Administered 2013-03-08: 20 mL via INTRAVENOUS

## 2013-03-14 ENCOUNTER — Other Ambulatory Visit: Payer: Self-pay | Admitting: *Deleted

## 2013-03-14 ENCOUNTER — Ambulatory Visit (INDEPENDENT_AMBULATORY_CARE_PROVIDER_SITE_OTHER): Payer: BC Managed Care – PPO | Admitting: General Surgery

## 2013-03-14 ENCOUNTER — Encounter (INDEPENDENT_AMBULATORY_CARE_PROVIDER_SITE_OTHER): Payer: Self-pay | Admitting: General Surgery

## 2013-03-14 ENCOUNTER — Encounter: Payer: Self-pay | Admitting: *Deleted

## 2013-03-14 VITALS — BP 134/82 | HR 70 | Temp 97.8°F | Resp 16 | Ht 62.0 in | Wt 231.4 lb

## 2013-03-14 DIAGNOSIS — C50411 Malignant neoplasm of upper-outer quadrant of right female breast: Secondary | ICD-10-CM

## 2013-03-14 DIAGNOSIS — C50419 Malignant neoplasm of upper-outer quadrant of unspecified female breast: Secondary | ICD-10-CM

## 2013-03-14 DIAGNOSIS — Z171 Estrogen receptor negative status [ER-]: Secondary | ICD-10-CM | POA: Insufficient documentation

## 2013-03-14 NOTE — Progress Notes (Signed)
Patient ID: Sydney Ford, female   DOB: 03/24/1955, 57 y.o.   MRN: [id number]  Chief Complaint  Patient presents with  . Other    breast cancer    HPI Sydney Ford is a 57 y.o. female.  Referred by Dr Elizabeth Eagle HPI This is a 57-year-old female who noticed a right breast mass as well as some tugging several months ago. She then eventually underwent a right breast mammogram that showed a 2.2 x 2.4 x 1.4 cm mass. The left side is negative. Her nodes are negative by ultrasound. She has no real prior history of any breast complaints except for some bleeding from her right nipple in 1984 that was deemed to be negative. She has no family history of breast ovarian cancer. She then underwent an MRI which showed a 3x2 x 1.7 irregular enhancing mass in the upper outer right breast consistent with a biopsy-proven cancer. The left breast is normal. There are no abnormal appearing lymph nodes. She is a diffusely enlarged thyroid gland that is consistent with her diagnosis of Hashimoto's. She underwent a biopsy of the right sided breast lesion which shows a triple negative breast cancer with a proliferation marker of 99%. 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 She comes in today to discuss her options. Past Medical History  Diagnosis Date  . Anemia   . Cancer   . Hypertension   . Hyperlipidemia   . Thyroid disease   . Osteoporosis     Past Surgical History  Procedure Laterality Date  . Cesarean section  1987  . Tonsillectomy      Family History  Problem Relation Age of Onset  . Heart failure Mother   . Heart attack Father     Social History History  Substance Use Topics  . Smoking status: Former Smoker    Quit date: 03/17/1985  . Smokeless tobacco: Not on file  . Alcohol Use: Not on file    Allergies  Allergen Reactions  . Codeine     REACTION: Nausea    Current Outpatient Prescriptions  Medication Sig Dispense Refill  . ARMOUR THYROID 60 MG tablet       .  atorvastatin (LIPITOR) 20 MG tablet       . chlorpheniramine-HYDROcodone (TUSSIONEX) 10-8 MG/5ML LQCR       . diclofenac sodium (VOLTAREN) 1 % GEL Apply 1 application topically 4 (four) times daily.  500 g  11  . lisinopril (PRINIVIL,ZESTRIL) 20 MG tablet       . LORazepam (ATIVAN) 0.5 MG tablet       . meloxicam (MOBIC) 15 MG tablet       . sertraline (ZOLOFT) 50 MG tablet       . traMADol (ULTRAM) 50 MG tablet        No current facility-administered medications for this visit.    Review of Systems Review of Systems  Constitutional: Negative for fever, chills and unexpected weight change.  HENT: Positive for voice change. Negative for congestion, hearing loss, sore throat and trouble swallowing.   Eyes: Negative for visual disturbance.  Respiratory: Positive for cough. Negative for wheezing.   Cardiovascular: Negative for chest pain, palpitations and leg swelling.  Gastrointestinal: Negative for nausea, vomiting, abdominal pain, diarrhea, constipation, blood in stool, abdominal distention and anal bleeding.  Genitourinary: Negative for hematuria, vaginal bleeding and difficulty urinating.  Musculoskeletal: Positive for arthralgias.  Skin: Negative for rash and wound.  Neurological: Negative for seizures, syncope and headaches.  Hematological:   Negative for adenopathy. Does not bruise/bleed easily.  Psychiatric/Behavioral: Negative for confusion.    Blood pressure 134/82, pulse 70, temperature 97.8 F (36.6 C), resp. rate 16, height 5' 2" (1.575 m), weight 231 lb 6.4 oz (104.962 kg).  Physical Exam Physical Exam  Vitals reviewed. Constitutional: She appears well-developed and well-nourished.  Cardiovascular: Normal rate, regular rhythm and normal heart sounds.   Pulmonary/Chest: Effort normal and breath sounds normal. She has no wheezes. She has no rales. Right breast exhibits mass. Right breast exhibits no inverted nipple, no nipple discharge, no skin change and no tenderness.  Left breast exhibits no inverted nipple, no mass, no nipple discharge, no skin change and no tenderness.    Lymphadenopathy:    She has no cervical adenopathy.    She has no axillary adenopathy.       Right: No supraclavicular adenopathy present.       Left: No supraclavicular adenopathy present.    Data Reviewed BILATERAL BREAST MRI WITH AND WITHOUT CONTRAST  LABS: BUN and creatinine were obtained on site at [facility] at  [address]  Results: BUN 15 mg/dL, Creatinine 0.9 mg/dL.  TECHNIQUE:  Multiplanar, multisequence MR images of both breasts were obtained  prior to and following the intravenous administration of 20ml of  MultiHance.  THREE-DIMENSIONAL MR IMAGE RENDERING ON INDEPENDENT WORKSTATION:  Three-dimensional MR images were rendered by post-processing of the  original MR data on an independent workstation. The  three-dimensional MR images were interpreted, and findings are  reported in the following complete MRI report for this study. Three  dimensional images were evaluated at the independent DynaCad  workstation  COMPARISON: Previous exams  FINDINGS:  Breast composition: c: Heterogeneous fibroglandular tissue  Background parenchymal enhancement: Mild  Right breast: A 3 x 2 x 1.7 cm irregular enhancing mass is  identified in the upper-outer right breast, consistent with the  biopsy-proven invasive mammary carcinoma. The mass is associated  with mixed kinetics, which are predominantly washout and plateau.  Susceptibility artifact is identified within the mass, consistent  with the biopsy site marker. No additional areas of abnormal  enhancement.  Left breast: No mass or abnormal enhancement.  Lymph nodes: No abnormal appearing lymph nodes.  Ancillary findings: A diffusely enlarged thyroid gland is  identified. This was noted on a previous thyroid ultrasound in 2009.  Additionally, there is a T2 hyperintense lesion in close  approximation to the  thyroid isthmus. This measures 1.6 x 1.4 x 0.6  cm.  IMPRESSION:  1. A 3 x 2 x 1.7 cm irregularly enhancing mass in the upper-outer  right breast, consistent with the biopsy-proven invasive mammary  carcinoma.  2. T2 hyperintense lesion in close approximation to the thyroid  isthmus. This measures up to 1.6 cm. The thyroid is diffusely  enlarged, as seen on a prior thyroid ultrasound.  RECOMMENDATION:  Treatment planning for biopsy-proven right breast carcinoma. Thyroid  ultrasound is recommended for T2 hyperintense lesion in close  approximation to the thyroid isthmus.   Assessment    Clinical stage II right breast cancer, triple negative     Plan    I think that the best plan for her would be primary systemic chemotherapy we discussed the indications for that would be decreased size and make this more amenable to a lumpectomy, assess response of her tumor in situ, and maybe even better therapy for her triple negative for tumor to begin with. I told her that we would decide what surgery we   would need to do at the completion of her chemotherapy which would entail another MRI. We discussed the staging and pathophysiology of breast cancer. We discussed all of the different options for treatment for breast cancer including surgery, chemotherapy, radiation therapy, Herceptin, and antiestrogen therapy. She is not a candidate for last two.  I provided pathology copy.  We discussed a sentinel lymph node biopsy as she does not appear to having lymph node involvement right now. We discussed the performance of that with injection of radioactive tracer and blue dye.  We discussed that there is probably about a 20% chance of having a positive node with a sentinel lymph node biopsy. I think right now a mastectomy might be better option although could probably due a lumpectomy with decent result.  I think this could be made better with primary chemotherapy with no disadvantage in terms of cancer  treatment. She will see Dr Magrinat tomorrow.          Judyann Casasola 03/14/2013, 3:12 PM    

## 2013-03-14 NOTE — Addendum Note (Signed)
Addended byIgnacia Marvel on: 03/14/2013 03:45 PM   Modules accepted: Orders

## 2013-03-14 NOTE — Progress Notes (Signed)
Sydney Ford enter labs.  I completed chart and placed in Dr. Darrall Dears box.

## 2013-03-14 NOTE — Progress Notes (Signed)
Received request from [facility] to add pt to see Dr. Darnelle Catalan tomorrow 12/30 at 9/9:30.  Unable to make the appt for Dr. Darnelle Catalan so I emailed Tiffany M. To enter the appt.  Unable to mail before appt letter - Dr. Dwain Sarna to give pt the information.  Unable to mail the Intake form - placed a note in EPIC for one to be given at time of check in.  Made chart.

## 2013-03-15 ENCOUNTER — Telehealth: Payer: Self-pay | Admitting: Oncology

## 2013-03-15 ENCOUNTER — Encounter: Payer: Self-pay | Admitting: *Deleted

## 2013-03-15 ENCOUNTER — Telehealth: Payer: Self-pay | Admitting: *Deleted

## 2013-03-15 ENCOUNTER — Ambulatory Visit: Payer: BC Managed Care – PPO

## 2013-03-15 ENCOUNTER — Ambulatory Visit (HOSPITAL_BASED_OUTPATIENT_CLINIC_OR_DEPARTMENT_OTHER): Payer: BC Managed Care – PPO | Admitting: Oncology

## 2013-03-15 ENCOUNTER — Other Ambulatory Visit (HOSPITAL_BASED_OUTPATIENT_CLINIC_OR_DEPARTMENT_OTHER): Payer: BC Managed Care – PPO

## 2013-03-15 ENCOUNTER — Encounter (INDEPENDENT_AMBULATORY_CARE_PROVIDER_SITE_OTHER): Payer: Self-pay

## 2013-03-15 VITALS — BP 154/94 | HR 83 | Temp 98.9°F | Resp 18 | Ht 62.0 in | Wt 229.3 lb

## 2013-03-15 DIAGNOSIS — Z171 Estrogen receptor negative status [ER-]: Secondary | ICD-10-CM

## 2013-03-15 DIAGNOSIS — C50411 Malignant neoplasm of upper-outer quadrant of right female breast: Secondary | ICD-10-CM

## 2013-03-15 DIAGNOSIS — C50419 Malignant neoplasm of upper-outer quadrant of unspecified female breast: Secondary | ICD-10-CM

## 2013-03-15 LAB — CBC WITH DIFFERENTIAL/PLATELET
BASO%: 0.7 % (ref 0.0–2.0)
HGB: 15.6 g/dL (ref 11.6–15.9)
LYMPH%: 22.7 % (ref 14.0–49.7)
MCHC: 34.7 g/dL (ref 31.5–36.0)
MCV: 88.2 fL (ref 79.5–101.0)
MONO#: 0.5 10*3/uL (ref 0.1–0.9)
MONO%: 7.8 % (ref 0.0–14.0)
NEUT#: 4.2 10*3/uL (ref 1.5–6.5)
NEUT%: 66.3 % (ref 38.4–76.8)
Platelets: 205 10*3/uL (ref 145–400)
RBC: 5.09 10*6/uL (ref 3.70–5.45)
RDW: 13.2 % (ref 11.2–14.5)
WBC: 6.3 10*3/uL (ref 3.9–10.3)

## 2013-03-15 LAB — COMPREHENSIVE METABOLIC PANEL (CC13)
ALT: 29 U/L (ref 0–55)
AST: 19 U/L (ref 5–34)
Albumin: 4 g/dL (ref 3.5–5.0)
Alkaline Phosphatase: 85 U/L (ref 40–150)
Anion Gap: 14 mEq/L — ABNORMAL HIGH (ref 3–11)
CO2: 24 mEq/L (ref 22–29)
Potassium: 4.3 mEq/L (ref 3.5–5.1)
Sodium: 141 mEq/L (ref 136–145)
Total Bilirubin: 0.86 mg/dL (ref 0.20–1.20)
Total Protein: 7.4 g/dL (ref 6.4–8.3)

## 2013-03-15 MED ORDER — PROCHLORPERAZINE MALEATE 10 MG PO TABS: 10.0000 mg | ORAL_TABLET | Freq: Four times a day (QID) | ORAL | Status: DC | PRN

## 2013-03-15 MED ORDER — DEXAMETHASONE 4 MG PO TABS: ORAL_TABLET | ORAL | Status: DC

## 2013-03-15 MED ORDER — LIDOCAINE-PRILOCAINE 2.5-2.5 % EX CREA: 1.0000 "application " | TOPICAL_CREAM | CUTANEOUS | Status: DC | PRN

## 2013-03-15 MED ORDER — ONDANSETRON HCL 8 MG PO TABS: 8.0000 mg | ORAL_TABLET | Freq: Two times a day (BID) | ORAL | Status: DC | PRN

## 2013-03-15 MED ORDER — LORAZEPAM 0.5 MG PO TABS: 0.5000 mg | ORAL_TABLET | Freq: Four times a day (QID) | ORAL | Status: DC | PRN

## 2013-03-15 NOTE — Telephone Encounter (Signed)
, °

## 2013-03-15 NOTE — Telephone Encounter (Signed)
Per staff message and POF I have scheduled appts.  JMW  

## 2013-03-15 NOTE — Progress Notes (Signed)
[name]  [phone] [phone]     ID: Sydney Ford OB: March 09, 1956  MR#: [id number]  JYN#:[id number]  PCP: Michiel Sites, MD GYN:   SU: Emelia Loron OTHER MD: Cain Saupe  CHIEF COMPLAINT: "I have breast cancer"  HISTORY OF PRESENT ILLNESS: Sydney Ford (pronounced "line 'em") herself noted a change in her right breast sometime October 2014 but "my breasts are like bean bags" and she initially ignored it. After while she began to feel a pole when she lay on her right side at night so she scheduled herself for screening mammography at the breast Center 02/08/2013 (prior mammogram had been April 2011), and this did show a possible mass in the right breast. Unilateral right diagnostic mammography and ultrasonography 02/28/2013 confirmed an irregular mass in the right breast upper outer quadrant measuring 2.4 cm. This was palpable at the 11:00 position. Ultrasound showed an irregular hypoechoic mass measuring 1.5 cm with no abnormal adenopathy in the right axilla.  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  The patient met with Dr. Dwain Sarna 03/14/2013 and he recommended primary systemic chemotherapy to decrease the size of the tumor and increase the chance of lumpectomy. The patient's subsequent history is as detailed below  INTERVAL HISTORY: Pen was evaluated in the breast clinic 03/15/2013 accompanied by her husband Sydney Ford.  REVIEW OF SYSTEMS: Aside from the new right breast lump, her review of systems is significant for mild hoarseness and a recent upper respiratory infection. She tells me she usually sleeps on 1-1/2 pillows. She has chronic low back pain which doesn't keep her from working 5 days a week. She also has "arthritis  everywhere" including her hands and wrists. She has a history of depression, but denies anxiety, phobia as, or suicidal thoughts. She is moderate GERD symptoms. She tells me she has asthma which he controls "with no metastases". 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 She denies chest pain or pressure. There have been no palpitations or murmurs. Abdomen no unusual headaches, visual changes, dizziness, gait imbalance, or nausea. A detailed review of systems today was otherwise noncontributory  PAST MEDICAL HISTORY: Past Medical History  Diagnosis Date  . Anemia   . Cancer   . Hypertension   . Hyperlipidemia   . Thyroid disease   . Osteoporosis     PAST SURGICAL HISTORY: Past Surgical History  Procedure Laterality Date  . Cesarean section  1987  . Tonsillectomy      FAMILY HISTORY Family History  Problem Relation Age of Onset  . Heart failure Mother   . Heart attack Father    the patient's father died at the age of 40 from a myocardial infarction. The patient's mother died at the age of 6 with congestive heart failure. She also had a history of lupus. The patient had no brothers or sisters. There is no history of breast or ovarian cancer in the family to her knowledge.  GYNECOLOGIC HISTORY:  Menarche age 87, first live birth age 48. She is GX P2. She underwent endometrial ablation in 2009 and has not had a period since that time. She never took hormone replacement.  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 She does not work for a company. Her husband Sydney Ford "Sydney Ford" Warner Mccreedy is semiretired. He works for a company that [employer] data on various goods. Son Sydney Ford is in [place] and works in Holiday representative. Son Sydney Ford lives  at home and is disabled with a diagnosis of paranoid schizophrenia. The patient has no grandchildren. She is not a church attender    ADVANCED DIRECTIVES: Not in place   HEALTH MAINTENANCE: History  Substance Use Topics  . Smoking status: Former Smoker    Quit date: 03/17/1985  . Smokeless tobacco: Not on file  . Alcohol Use: Not on file     Colonoscopy: Never  PAP: Remote  Bone density: November 2014, showed osteoporosis according to the patient's report  Lipid panel:  Allergies  Allergen Reactions  . Codeine     REACTION: Nausea    Current Outpatient Prescriptions  Medication Sig Dispense Refill  . ARMOUR THYROID 60 MG tablet       . atorvastatin (LIPITOR) 20 MG tablet       . lisinopril (PRINIVIL,ZESTRIL) 20 MG tablet       . meloxicam (MOBIC) 15 MG tablet       . sertraline (ZOLOFT) 50 MG tablet       . dexamethasone (DECADRON) 4 MG tablet Take 2 tablets by mouth once a day on the day after chemotherapy and then take 2 tablets two times a day for 2 days. Take with food.  30 tablet  1  . diclofenac sodium (VOLTAREN) 1 % GEL Apply 1 application topically 4 (four) times daily.  500 g  11  . LORazepam (ATIVAN) 0.5 MG tablet       . LORazepam (ATIVAN) 0.5 MG tablet Take 1 tablet (0.5 mg total) by mouth every 6 (six) hours as needed (Nausea or vomiting).  30 tablet  0  . ondansetron (ZOFRAN) 8 MG tablet Take 1 tablet (8 mg total) by mouth 2 (two) times daily as needed. Take two times a day as needed for nausea or vomiting starting on the third day after chemotherapy.  30 tablet  1  . prochlorperazine (COMPAZINE) 10 MG tablet Take 1 tablet (10 mg total) by mouth every 6 (six) hours as needed (Nausea or vomiting).  30 tablet  1   No current facility-administered medications for this visit.    OBJECTIVE: Middle-aged white woman in no acute distress Filed Vitals:   03/15/13 0930  BP: 154/94  Pulse: 83  Temp: 98.9 F (37.2 C)  Resp: 18     Body mass index is 41.93 kg/(m^2).     ECOG FS:0 - Asymptomatic  Ocular: Sclerae unicteric, pupils equal, round and reactive to light Ear-nose-throat: Oropharynx clear, no thrush or other lesions Lymphatic: No cervical or supraclavicular adenopathy Lungs no rales or rhonchi, good excursion bilaterally Heart regular rate and rhythm, no murmur appreciated Abd soft, nontender, positive bowel sounds MSK no focal spinal tenderness, no joint edema Neuro: non-focal, well-oriented, appropriate affect Breasts: The right breast is status post recent biopsy and there is a minimal ecchymosis in the upper outer quadrant. The mass in the right upper quadrant is palpable, movable, and nontender. The size is difficult to estimate by palpation, about 2 cm. There are no skin or nipple changes of concern. The right axilla is entirely benign. The left breast is unremarkable   LAB  RESULTS:  CMP     Component Value Date/Time   NA 141 03/15/2013 0909   NA 141 08/16/2007 0833   K 4.3 03/15/2013 0909   K 4.5 08/16/2007 0833   CL 105 08/16/2007 0833   CO2 24 03/15/2013 0909   CO2 27 08/16/2007 0833   GLUCOSE 102 03/15/2013 0909   GLUCOSE 105* 08/16/2007 0833   BUN 13.4 03/15/2013 0909   BUN 14 08/16/2007 0833   CREATININE 0.9 03/15/2013 0909   CREATININE 0.9 08/16/2007 0833   CALCIUM 9.8 03/15/2013 0909   CALCIUM 9.7 08/16/2007 0833   PROT 7.4 03/15/2013 0909   PROT 7.5 08/16/2007 0833   ALBUMIN 4.0 03/15/2013 0909   ALBUMIN 4.1 08/16/2007 0833   AST 19 03/15/2013 0909   AST 25 08/16/2007 0833   ALT 29 03/15/2013 0909   ALT 30 08/16/2007 0833   ALKPHOS 85 03/15/2013 0909   ALKPHOS 64 08/16/2007 0833   BILITOT 0.86 03/15/2013 0909   BILITOT 1.0 08/16/2007 0833   GFRNONAA 69 08/16/2007 0833   GFRAA 84 08/16/2007 0833    I No results found for this basename: SPEP,  UPEP,   kappa and lambda light chains    Lab Results  Component Value Date   WBC 6.3 03/15/2013   NEUTROABS 4.2 03/15/2013   HGB 15.6 03/15/2013   HCT 44.9 03/15/2013   MCV 88.2 03/15/2013    PLT 205 03/15/2013      Chemistry      Component Value Date/Time   NA 141 03/15/2013 0909   NA 141 08/16/2007 0833   K 4.3 03/15/2013 0909   K 4.5 08/16/2007 0833   CL 105 08/16/2007 0833   CO2 24 03/15/2013 0909   CO2 27 08/16/2007 0833   BUN 13.4 03/15/2013 0909   BUN 14 08/16/2007 0833   CREATININE 0.9 03/15/2013 0909   CREATININE 0.9 08/16/2007 0833      Component Value Date/Time   CALCIUM 9.8 03/15/2013 0909   CALCIUM 9.7 08/16/2007 0833   ALKPHOS 85 03/15/2013 0909   ALKPHOS 64 08/16/2007 0833   AST 19 03/15/2013 0909   AST 25 08/16/2007 0833   ALT 29 03/15/2013 0909   ALT 30 08/16/2007 0833   BILITOT 0.86 03/15/2013 0909   BILITOT 1.0 08/16/2007 0833       No results found for this basename: LABCA2    No components found with this basename: LABCA125    No results found for this basename: INR,  in the last 168 hours  Urinalysis No results found for this basename: colorurine,  appearanceur,  labspec,  phurine,  glucoseu,  hgbur,  bilirubinur,  ketonesur,  proteinur,  urobilinogen,  nitrite,  leukocytesur    STUDIES: US Breast Right  02/28/2013   CLINICAL DATA:  Patient returns for evaluation of a possible mass in the right upper outer quadrant noted on recent screening study dated 02/08/2013.  EXAM: DIGITAL DIAGNOSTIC  RIGHT MAMMOGRAM  ULTRASOUND RIGHT BREAST  COMPARISON:  06/25/2009, 04/10/2008  ACR Breast Density Category b: There are scattered areas of fibroglandular density.  FINDINGS: Additional views confirm the presence of an irregular mass in the right mid upper outer quadrant. This measures approximately 2.2 x 2.4 x 1.4 cm mammographically.  On physical exam I palpate a 1.5 cm mass at 11 o'clock 5 cm from the right nipple.  Ultrasound is performed, showing an irregular hypoechoic mass at 11 o'clock 5 cm from the right nipple measuring 1.5 cm. No abnormal lymph nodes are noted  in the right axilla.  The appearance is highly suspicious for invasive mammary carcinoma. Biopsy is  suggested. Ultrasound-guided core needle biopsy was discussed with the patient and she agreed with this plan.  IMPRESSION: Highly suspicious irregular mass at 11 o'clock 5 cm from the right nipple.  RECOMMENDATION: Ultrasound-guided core needle biopsy is suggested. This will be performed and reported separately.  I have discussed the findings and recommendations with the patient. Results were also provided in writing at the conclusion of the visit. If applicable, a reminder letter will be sent to the patient regarding the next appointment.  Report was telephoned to [facility], Dr. Marylen Ponto nurse.  BI-RADS CATEGORY  5: Highly suggestive of malignancy - appropriate action should be taken.   Electronically Signed   By: Cain Saupe M.D.   On: 02/28/2013 11:52   Mr Breast Bilateral W Wo Contrast  03/08/2013   CLINICAL DATA:  Recent diagnosis of invasive mammary carcinoma following ultrasound-guided biopsy of a right breast mass at 11 o'clock.  EXAM: BILATERAL BREAST MRI WITH AND WITHOUT CONTRAST  LABS:  BUN and creatinine were obtained on site at [facility] at  [address]  Results:  BUN 15 mg/dL,  Creatinine 0.9 mg/dL.  TECHNIQUE: Multiplanar, multisequence MR images of both breasts were obtained prior to and following the intravenous administration of 20ml of MultiHance.  THREE-DIMENSIONAL MR IMAGE RENDERING ON INDEPENDENT WORKSTATION:  Three-dimensional MR images were rendered by post-processing of the original MR data on an independent workstation. The three-dimensional MR images were interpreted, and findings are reported in the following complete MRI report for this study. Three dimensional images were evaluated at the independent DynaCad workstation  COMPARISON:  Previous exams  FINDINGS: Breast composition: c:  Heterogeneous fibroglandular tissue  Background parenchymal enhancement: Mild  Right breast: A 3 x 2 x 1.7 cm irregular enhancing mass is identified in the upper-outer right breast,  consistent with the biopsy-proven invasive mammary carcinoma. The mass is associated with mixed kinetics, which are predominantly washout and plateau. Susceptibility artifact is identified within the mass, consistent with the biopsy site marker. No additional areas of abnormal enhancement.  Left breast: No mass or abnormal enhancement.  Lymph nodes: No abnormal appearing lymph nodes.  Ancillary findings: A diffusely enlarged thyroid gland is identified. This was noted on a previous thyroid ultrasound in 2009. Additionally, there is a T2 hyperintense lesion in close approximation to the thyroid isthmus. This measures 1.6 x 1.4 x 0.6 cm.  IMPRESSION: 1. A 3 x 2 x 1.7 cm irregularly enhancing mass in the upper-outer right breast, consistent with the biopsy-proven invasive mammary carcinoma. 2. T2 hyperintense lesion in close approximation to the thyroid isthmus. This measures up to 1.6 cm. The thyroid is diffusely enlarged, as seen on a prior thyroid ultrasound.  RECOMMENDATION: Treatment planning for biopsy-proven right breast carcinoma. Thyroid ultrasound is recommended for T2 hyperintense lesion in close approximation to the thyroid isthmus.  BI-RADS CATEGORY  6: Known biopsy-proven malignancy - appropriate action should be taken.   Electronically Signed   By: Jerene Dilling M.D.   On: 03/08/2013 15:29   Mm Digital Diagnostic Unilat R  02/28/2013   CLINICAL DATA:  Ultrasound-guided core needle biopsy of a mass at 11 o'clock 5 cm from the right nipple with clip placement.  EXAM: POST-BIOPSY CLIP PLACEMENT RIGHT DIAGNOSTIC MAMMOGRAM  COMPARISON:  Previous exams.  FINDINGS: Films are performed following ultrasound guided biopsy of a mass at 11 o'clock 5 cm from the right nipple. The  ribbon clip is appropriately positioned within the mass.  IMPRESSION: Appropriate clip placement following ultrasound-guided core needle biopsy of a mass at 11 o'clock 5 cm from the right nipple.  Final Assessment: Post Procedure  Mammograms for Marker Placement   Electronically Signed   By: Cain Saupe M.D.   On: 02/28/2013 11:56   Mm Digital Diagnostic Unilat R  02/28/2013   CLINICAL DATA:  Patient returns for evaluation of a possible mass in the right upper outer quadrant noted on recent screening study dated 02/08/2013.  EXAM: DIGITAL DIAGNOSTIC  RIGHT MAMMOGRAM  ULTRASOUND RIGHT BREAST  COMPARISON:  06/25/2009, 04/10/2008  ACR Breast Density Category b: There are scattered areas of fibroglandular density.  FINDINGS: Additional views confirm the presence of an irregular mass in the right mid upper outer quadrant. This measures approximately 2.2 x 2.4 x 1.4 cm mammographically.  On physical exam I palpate a 1.5 cm mass at 11 o'clock 5 cm from the right nipple.  Ultrasound is performed, showing an irregular hypoechoic mass at 11 o'clock 5 cm from the right nipple measuring 1.5 cm. No abnormal lymph nodes are noted in the right axilla.  The appearance is highly suspicious for invasive mammary carcinoma. Biopsy is suggested. Ultrasound-guided core needle biopsy was discussed with the patient and she agreed with this plan.  IMPRESSION: Highly suspicious irregular mass at 11 o'clock 5 cm from the right nipple.  RECOMMENDATION: Ultrasound-guided core needle biopsy is suggested. This will be performed and reported separately.  I have discussed the findings and recommendations with the patient. Results were also provided in writing at the conclusion of the visit. If applicable, a reminder letter will be sent to the patient regarding the next appointment.  Report was telephoned to [facility], Dr. Marylen Ponto nurse.  BI-RADS CATEGORY  5: Highly suggestive of malignancy - appropriate action should be taken.   Electronically Signed   By: Cain Saupe M.D.   On: 02/28/2013 11:52   [place] Rt Breast Bx W Loc Dev 1st Lesion Img Bx Spec US Guide  03/01/2013   ADDENDUM REPORT: 03/01/2013 13:51  ADDENDUM: Histologic evaluation demonstrates invasive  mammary carcinoma. E-cadherin stains are pending. This is concordant with the imaging findings. Results were discussed with the patient by telephone at her request. Surgical consultation has been scheduled with Dr. Dwain Sarna for 03/14/2013 at 2 p.m. Breast MRI will be scheduled as soon as possible. The patient reports no complications from the procedure. Her questions were answered. Informational materials were offered.   Electronically Signed   By: Cain Saupe M.D.   On: 03/01/2013 13:51   03/01/2013   CLINICAL DATA:  Suspicious irregular mass at 11 o'clock 5 cm from the right nipple  EXAM: ULTRASOUND GUIDED RIGHT BREAST CORE NEEDLE BIOPSY WITH VACUUM ASSIST  COMPARISON:  Previous exams.  PROCEDURE: I met with the patient and we discussed the procedure of ultrasound-guided biopsy, including benefits and alternatives. We discussed the high likelihood of a successful procedure. We discussed the risks of the procedure including infection, bleeding, tissue injury, clip migration, and inadequate sampling. Informed written consent was given. The usual time-out protocol was performed immediately prior to the procedure.  Using sterile technique, 2% Lidocaine as local anesthetic, and direct ultrasound visualization, a 12 gauge vacuum-assisteddevice was used to perform biopsy of the mass at 11 o'clock 5 cm from the right nipple using a caudocranial approach. At the conclusion of the procedure, a ribbon tissue marker clip was deployed into the biopsy cavity. Follow-up 2-view mammogram was  performed and dictated separately.  IMPRESSION: Ultrasound-guided biopsy of a mass at 11 o'clock 5 cm from the right nipple. No apparent complications.  Electronically Signed: By: Cain Saupe M.D. On: 02/28/2013 11:55    ASSESSMENT: 57 y.o. 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  (1) dose  dense doxorubicin and cyclophosphamide to start 03/31/2012, repeated every 2 weeks x4 with Neulasta support; to be followed by weekly carboplatin and paclitaxel x12  PLAN: We spent the better part of today's hour-long appointment discussing the biology of breast cancer in general, and the specifics of the patient's tumor in particular. [name] understands that there is no survival difference comparing mastectomy to lumpectomy plus radiation. She would by far prefer to keep her breast if at all possible, and therefore she is very motivated to start with neoadjuvant chemotherapy. Because the plan is going to be for lumpectomy, I do not feel we need to do sentinel lymph node sampling at the time of port placement. That of course would be a consideration if we had a strong feeling we were heading towards mastectomy.  The plan for [name] will be standard doxorubicin and cyclophosphamide in dose dense fashion x4 followed by carboplatin and paclitaxel weekly x12. The entire treatment will take 20 weeks. She will need a port and an echocardiogram before starting. She will also come to chemotherapy school. Hopefully we can get all that organized for the next week or so and our target starting date is January 15.  Today we did discuss the possible toxicities, side effects and complications of chemotherapy. I also placed her antinausea medicines for her to pick up from her pharmacy. [name] is hoping to be able to continue to work through her treatment and I think if she gets treated on a Thursday she probably will be able to resume work on a Monday.   [name] has a good understanding of the overall plan, and agrees with it. She knows the goal of treatment in her case is cure. She will call with any problems or questions that she may have before her next visit here.  Lowella Dell, MD   03/15/2013 11:09 AM

## 2013-03-15 NOTE — Progress Notes (Signed)
Faxed Care Plan to Leigh at [facility] & to PCP.  Took Care Plan to Med Rec to scan. 

## 2013-03-17 HISTORY — PX: BREAST LUMPECTOMY: SHX2

## 2013-03-18 ENCOUNTER — Other Ambulatory Visit: Payer: Self-pay | Admitting: *Deleted

## 2013-03-18 ENCOUNTER — Other Ambulatory Visit (INDEPENDENT_AMBULATORY_CARE_PROVIDER_SITE_OTHER): Payer: Self-pay | Admitting: General Surgery

## 2013-03-18 NOTE — Telephone Encounter (Signed)
This RN called zofran and ativan to pt's pharmacy.

## 2013-03-21 ENCOUNTER — Encounter (HOSPITAL_COMMUNITY): Payer: Self-pay | Admitting: *Deleted

## 2013-03-21 NOTE — Progress Notes (Signed)
SAME DAY SURGERY PLANNED - PREOP INSTRUCTIONS AND CHLORHEXIDINE INSTRUCTIONS / PRECAUTIONS DISCUSSED WITH PT BY PHONE.

## 2013-03-22 ENCOUNTER — Other Ambulatory Visit: Payer: BC Managed Care – PPO

## 2013-03-22 ENCOUNTER — Encounter: Payer: Self-pay | Admitting: *Deleted

## 2013-03-22 ENCOUNTER — Ambulatory Visit (HOSPITAL_COMMUNITY)
Admission: RE | Admit: 2013-03-22 | Discharge: 2013-03-22 | Disposition: A | Discharge status: Home / Self Care | Payer: BC Managed Care – PPO | Source: Ambulatory Visit | Attending: Oncology | Admitting: Oncology

## 2013-03-22 ENCOUNTER — Encounter (HOSPITAL_COMMUNITY): Payer: Self-pay

## 2013-03-22 DIAGNOSIS — Z01818 Encounter for other preprocedural examination: Secondary | ICD-10-CM | POA: Insufficient documentation

## 2013-03-22 DIAGNOSIS — E669 Obesity, unspecified: Secondary | ICD-10-CM | POA: Insufficient documentation

## 2013-03-22 DIAGNOSIS — C50919 Malignant neoplasm of unspecified site of unspecified female breast: Secondary | ICD-10-CM | POA: Insufficient documentation

## 2013-03-22 DIAGNOSIS — C50411 Malignant neoplasm of upper-outer quadrant of right female breast: Secondary | ICD-10-CM

## 2013-03-22 DIAGNOSIS — E785 Hyperlipidemia, unspecified: Secondary | ICD-10-CM | POA: Insufficient documentation

## 2013-03-22 DIAGNOSIS — I517 Cardiomegaly: Secondary | ICD-10-CM | POA: Insufficient documentation

## 2013-03-22 DIAGNOSIS — Z8249 Family history of ischemic heart disease and other diseases of the circulatory system: Secondary | ICD-10-CM | POA: Insufficient documentation

## 2013-03-22 DIAGNOSIS — I10 Essential (primary) hypertension: Secondary | ICD-10-CM | POA: Insufficient documentation

## 2013-03-22 MED ORDER — PERFLUTREN LIPID MICROSPHERE
1.0000 mL | INTRAVENOUS | Status: AC | PRN
  Administered 2013-03-22: 3 mL via INTRAVENOUS
  Filled 2013-03-22: qty 10

## 2013-03-22 MED ORDER — SODIUM CHLORIDE 0.9 % IV SOLN
INTRAVENOUS | Status: DC
  Administered 2013-03-22: 250 mL via INTRAVENOUS
  Filled 2013-03-22: qty 1000

## 2013-03-22 NOTE — Progress Notes (Addendum)
  Echocardiogram 2D Echocardiogram with Definity has been performed.  Diamond Nickel 03/22/2013, 9:48 AM

## 2013-03-23 ENCOUNTER — Ambulatory Visit (HOSPITAL_COMMUNITY): Payer: BC Managed Care – PPO

## 2013-03-23 ENCOUNTER — Encounter (HOSPITAL_COMMUNITY): Payer: Self-pay | Admitting: *Deleted

## 2013-03-23 ENCOUNTER — Ambulatory Visit (HOSPITAL_COMMUNITY)
Admission: RE | Admit: 2013-03-23 | Discharge: 2013-03-23 | Disposition: A | Discharge status: Home / Self Care | Payer: BC Managed Care – PPO | Source: Ambulatory Visit | Attending: General Surgery | Admitting: General Surgery

## 2013-03-23 ENCOUNTER — Other Ambulatory Visit (INDEPENDENT_AMBULATORY_CARE_PROVIDER_SITE_OTHER): Payer: Self-pay | Admitting: General Surgery

## 2013-03-23 ENCOUNTER — Encounter (HOSPITAL_COMMUNITY): Payer: BC Managed Care – PPO | Admitting: Anesthesiology

## 2013-03-23 ENCOUNTER — Ambulatory Visit (HOSPITAL_COMMUNITY): Payer: BC Managed Care – PPO | Admitting: Anesthesiology

## 2013-03-23 ENCOUNTER — Other Ambulatory Visit: Payer: Self-pay

## 2013-03-23 ENCOUNTER — Encounter (HOSPITAL_COMMUNITY)
Admission: RE | Disposition: A | Discharge status: Home / Self Care | Payer: Self-pay | Source: Ambulatory Visit | Attending: General Surgery

## 2013-03-23 DIAGNOSIS — M810 Age-related osteoporosis without current pathological fracture: Secondary | ICD-10-CM | POA: Insufficient documentation

## 2013-03-23 DIAGNOSIS — I10 Essential (primary) hypertension: Secondary | ICD-10-CM | POA: Insufficient documentation

## 2013-03-23 DIAGNOSIS — Z87891 Personal history of nicotine dependence: Secondary | ICD-10-CM | POA: Insufficient documentation

## 2013-03-23 DIAGNOSIS — C50919 Malignant neoplasm of unspecified site of unspecified female breast: Secondary | ICD-10-CM | POA: Insufficient documentation

## 2013-03-23 DIAGNOSIS — E785 Hyperlipidemia, unspecified: Secondary | ICD-10-CM | POA: Insufficient documentation

## 2013-03-23 DIAGNOSIS — E063 Autoimmune thyroiditis: Secondary | ICD-10-CM | POA: Insufficient documentation

## 2013-03-23 DIAGNOSIS — Z79899 Other long term (current) drug therapy: Secondary | ICD-10-CM | POA: Insufficient documentation

## 2013-03-23 HISTORY — DX: Pneumonia, unspecified organism: J18.9

## 2013-03-23 HISTORY — DX: Depression, unspecified: F32.A

## 2013-03-23 HISTORY — PX: PORTACATH PLACEMENT: SHX2246

## 2013-03-23 HISTORY — DX: Acute nasopharyngitis (common cold): J00

## 2013-03-23 HISTORY — DX: Personal history of urinary calculi: Z87.442

## 2013-03-23 HISTORY — DX: Unspecified osteoarthritis, unspecified site: M19.90

## 2013-03-23 HISTORY — DX: Major depressive disorder, single episode, unspecified: F32.9

## 2013-03-23 HISTORY — DX: Hypothyroidism, unspecified: E03.9

## 2013-03-23 HISTORY — DX: Headache: R51

## 2013-03-23 HISTORY — DX: Gastro-esophageal reflux disease without esophagitis: K21.9

## 2013-03-23 SURGERY — INSERTION, TUNNELED CENTRAL VENOUS DEVICE, WITH PORT
Anesthesia: General | Site: Chest

## 2013-03-23 MED ORDER — HYDROCODONE-ACETAMINOPHEN 10-325 MG PO TABS: 1.0000 | ORAL_TABLET | Freq: Four times a day (QID) | ORAL | Status: DC | PRN

## 2013-03-23 MED ORDER — MIDAZOLAM HCL 2 MG/2ML IJ SOLN
INTRAMUSCULAR | Status: AC
  Filled 2013-03-23: qty 2

## 2013-03-23 MED ORDER — BUPIVACAINE-EPINEPHRINE 0.25% -1:200000 IJ SOLN
INTRAMUSCULAR | Status: AC
  Filled 2013-03-23: qty 1

## 2013-03-23 MED ORDER — DEXAMETHASONE SODIUM PHOSPHATE 10 MG/ML IJ SOLN
INTRAMUSCULAR | Status: DC | PRN
  Administered 2013-03-23: 10 mg via INTRAVENOUS

## 2013-03-23 MED ORDER — SODIUM CHLORIDE 0.9 % IR SOLN
Status: DC | PRN
  Administered 2013-03-23: 1000 mL

## 2013-03-23 MED ORDER — CEFAZOLIN SODIUM-DEXTROSE 2-3 GM-% IV SOLR
INTRAVENOUS | Status: AC
  Filled 2013-03-23: qty 50

## 2013-03-23 MED ORDER — MIDAZOLAM HCL 5 MG/5ML IJ SOLN
INTRAMUSCULAR | Status: DC | PRN
  Administered 2013-03-23: 2 mg via INTRAVENOUS

## 2013-03-23 MED ORDER — HEPARIN SOD (PORK) LOCK FLUSH 100 UNIT/ML IV SOLN
INTRAVENOUS | Status: DC | PRN
  Administered 2013-03-23: 500 [IU]

## 2013-03-23 MED ORDER — CEFAZOLIN SODIUM-DEXTROSE 2-3 GM-% IV SOLR
2.0000 g | INTRAVENOUS | Status: AC
  Administered 2013-03-23: 2 g via INTRAVENOUS

## 2013-03-23 MED ORDER — BUPIVACAINE HCL (PF) 0.25 % IJ SOLN
INTRAMUSCULAR | Status: DC | PRN
  Administered 2013-03-23: 11 mL

## 2013-03-23 MED ORDER — BUPIVACAINE HCL (PF) 0.25 % IJ SOLN
INTRAMUSCULAR | Status: AC
  Filled 2013-03-23: qty 30

## 2013-03-23 MED ORDER — PROPOFOL 10 MG/ML IV BOLUS
INTRAVENOUS | Status: AC
  Filled 2013-03-23: qty 20

## 2013-03-23 MED ORDER — LIDOCAINE HCL (CARDIAC) 20 MG/ML IV SOLN
INTRAVENOUS | Status: AC
  Filled 2013-03-23: qty 5

## 2013-03-23 MED ORDER — FENTANYL CITRATE 0.05 MG/ML IJ SOLN
INTRAMUSCULAR | Status: DC | PRN
  Administered 2013-03-23: 25 ug via INTRAVENOUS
  Administered 2013-03-23: 50 ug via INTRAVENOUS
  Administered 2013-03-23: 25 ug via INTRAVENOUS

## 2013-03-23 MED ORDER — ONDANSETRON HCL 4 MG/2ML IJ SOLN
INTRAMUSCULAR | Status: AC
  Filled 2013-03-23: qty 2

## 2013-03-23 MED ORDER — PROPOFOL 10 MG/ML IV BOLUS
INTRAVENOUS | Status: DC | PRN
  Administered 2013-03-23: 180 mg via INTRAVENOUS

## 2013-03-23 MED ORDER — HEPARIN SOD (PORK) LOCK FLUSH 100 UNIT/ML IV SOLN
INTRAVENOUS | Status: AC
  Filled 2013-03-23: qty 5

## 2013-03-23 MED ORDER — LACTATED RINGERS IV SOLN
INTRAVENOUS | Status: DC | PRN
  Administered 2013-03-23: 13:00:00 via INTRAVENOUS

## 2013-03-23 MED ORDER — PROMETHAZINE HCL 25 MG/ML IJ SOLN: 6.2500 mg | INTRAMUSCULAR | Status: DC | PRN

## 2013-03-23 MED ORDER — FENTANYL CITRATE 0.05 MG/ML IJ SOLN
INTRAMUSCULAR | Status: AC
  Filled 2013-03-23: qty 2

## 2013-03-23 MED ORDER — FENTANYL CITRATE 0.05 MG/ML IJ SOLN: 25.0000 ug | INTRAMUSCULAR | Status: DC | PRN

## 2013-03-23 MED ORDER — SODIUM CHLORIDE 0.9 % IR SOLN
Freq: Once | Status: AC
  Administered 2013-03-23: 14:00:00
  Filled 2013-03-23: qty 1.2

## 2013-03-23 MED ORDER — DEXAMETHASONE SODIUM PHOSPHATE 10 MG/ML IJ SOLN
INTRAMUSCULAR | Status: AC
  Filled 2013-03-23: qty 1

## 2013-03-23 MED ORDER — HYDROCODONE-ACETAMINOPHEN 5-325 MG PO TABS: 1.0000 | ORAL_TABLET | Freq: Four times a day (QID) | ORAL | Status: DC | PRN

## 2013-03-23 MED ORDER — LACTATED RINGERS IV SOLN: INTRAVENOUS | Status: DC

## 2013-03-23 MED ORDER — LIDOCAINE HCL (CARDIAC) 20 MG/ML IV SOLN
INTRAVENOUS | Status: DC | PRN
  Administered 2013-03-23: 50 mg via INTRAVENOUS

## 2013-03-23 MED ORDER — ONDANSETRON HCL 4 MG/2ML IJ SOLN
INTRAMUSCULAR | Status: DC | PRN
  Administered 2013-03-23: 4 mg via INTRAVENOUS

## 2013-03-23 SURGICAL SUPPLY — 46 items
ADH SKN CLS APL DERMABOND .7 (GAUZE/BANDAGES/DRESSINGS) ×1
APL SKNCLS STERI-STRIP NONHPOA (GAUZE/BANDAGES/DRESSINGS)
BAG DECANTER FOR FLEXI CONT (MISCELLANEOUS) ×2 IMPLANT
BENZOIN TINCTURE PRP APPL 2/3 (GAUZE/BANDAGES/DRESSINGS) IMPLANT
BLADE HEX COATED 2.75 (ELECTRODE) ×2 IMPLANT
BLADE SURG 15 STRL LF DISP TIS (BLADE) ×1 IMPLANT
BLADE SURG 15 STRL SS (BLADE) ×2
BLADE SURG SZ11 CARB STEEL (BLADE) ×2 IMPLANT
DECANTER SPIKE VIAL GLASS SM (MISCELLANEOUS) ×1 IMPLANT
DERMABOND ADVANCED (GAUZE/BANDAGES/DRESSINGS) ×1
DERMABOND ADVANCED .7 DNX12 (GAUZE/BANDAGES/DRESSINGS) IMPLANT
DRAPE C-ARM 42X120 X-RAY (DRAPES) ×2 IMPLANT
DRAPE LAPAROSCOPIC ABDOMINAL (DRAPES) ×2 IMPLANT
DRSG TEGADERM 2-3/8X2-3/4 SM (GAUZE/BANDAGES/DRESSINGS) IMPLANT
DRSG TEGADERM 4X4.75 (GAUZE/BANDAGES/DRESSINGS) IMPLANT
ELECT REM PT RETURN 9FT ADLT (ELECTROSURGICAL) ×2
ELECTRODE REM PT RTRN 9FT ADLT (ELECTROSURGICAL) ×1 IMPLANT
GAUZE SPONGE 4X4 16PLY XRAY LF (GAUZE/BANDAGES/DRESSINGS) ×2 IMPLANT
GLOVE BIO SURGEON STRL SZ7 (GLOVE) ×6 IMPLANT
GLOVE BIOGEL PI IND STRL 7.0 (GLOVE) ×1 IMPLANT
GLOVE BIOGEL PI IND STRL 7.5 (GLOVE) ×3 IMPLANT
GLOVE BIOGEL PI INDICATOR 7.0 (GLOVE) ×1
GLOVE BIOGEL PI INDICATOR 7.5 (GLOVE) ×3
GOWN STRL REUS W/ TWL XL LVL3 (GOWN DISPOSABLE) ×1 IMPLANT
GOWN STRL REUS W/TWL LRG LVL3 (GOWN DISPOSABLE) ×4 IMPLANT
GOWN STRL REUS W/TWL XL LVL3 (GOWN DISPOSABLE) ×4 IMPLANT
KIT BASIN OR (CUSTOM PROCEDURE TRAY) ×2 IMPLANT
KIT PORT POWER 8FR ISP CVUE (Catheter) ×1 IMPLANT
KIT PORT POWER ISP 8FR (Catheter) IMPLANT
KIT POWER CATH 8FR (Catheter) IMPLANT
NDL HYPO 25X1 1.5 SAFETY (NEEDLE) ×1 IMPLANT
NEEDLE HYPO 25X1 1.5 SAFETY (NEEDLE) ×2 IMPLANT
NS IRRIG 1000ML POUR BTL (IV SOLUTION) ×2 IMPLANT
PACK BASIC VI WITH GOWN DISP (CUSTOM PROCEDURE TRAY) ×2 IMPLANT
PENCIL BUTTON HOLSTER BLD 10FT (ELECTRODE) ×2 IMPLANT
SPONGE GAUZE 4X4 12PLY (GAUZE/BANDAGES/DRESSINGS) IMPLANT
STRIP CLOSURE SKIN 1/2X4 (GAUZE/BANDAGES/DRESSINGS) ×1 IMPLANT
SUT MNCRL AB 4-0 PS2 18 (SUTURE) ×2 IMPLANT
SUT PROLENE 2 0 SH DA (SUTURE) ×3 IMPLANT
SUT SILK 2 0 (SUTURE)
SUT SILK 2-0 30XBRD TIE 12 (SUTURE) IMPLANT
SUT VIC AB 3-0 SH 27 (SUTURE) ×2
SUT VIC AB 3-0 SH 27XBRD (SUTURE) ×1 IMPLANT
SYR CONTROL 10ML LL (SYRINGE) ×2 IMPLANT
SYRINGE 10CC LL (SYRINGE) ×2 IMPLANT
TOWEL OR 17X26 10 PK STRL BLUE (TOWEL DISPOSABLE) ×2 IMPLANT

## 2013-03-23 NOTE — Progress Notes (Signed)
X-ray results noted 

## 2013-03-23 NOTE — Transfer of Care (Signed)
Immediate Anesthesia Transfer of Care Note  Patient: Sydney Ford  Procedure(s) Performed: Procedure(s): INSERTION PORT-A-CATH (N/A)  Patient Location: PACU  Anesthesia Type:General  Level of Consciousness: awake, alert  and oriented  Airway & Oxygen Therapy: Patient Spontanous Breathing and Patient connected to face mask oxygen  Post-op Assessment: Report given to PACU RN and Post -op Vital signs reviewed and stable  Post vital signs: Reviewed and stable  Complications: No apparent anesthesia complications

## 2013-03-23 NOTE — Discharge Instructions (Signed)
    PORT-A-CATH: POST OP INSTRUCTIONS  Always review your discharge instruction sheet given to you by the facility where your surgery was performed.   1. A prescription for pain medication may be given to you upon discharge. Take your pain medication as prescribed, if needed. If narcotic pain medicine is not needed, then you make take acetaminophen (Tylenol) or ibuprofen (Advil) as needed.  2. Take your usually prescribed medications unless otherwise directed. 3. If you need a refill on your pain medication, please contact our office. All narcotic pain medicine now requires a paper prescription.  Phoned in and fax refills are no longer allowed by law.  Prescriptions will not be filled after 5 pm or on weekends.  4. You should follow a light diet for the remainder of the day after your procedure. 5. Most patients will experience some mild swelling and/or bruising in the area of the incision. It may take several days to resolve. 6. It is common to experience some constipation if taking pain medication after surgery. Increasing fluid intake and taking a stool softener (such as Colace) will usually help or prevent this problem from occurring. A mild laxative (Milk of Magnesia or Miralax) should be taken according to package directions if there are no bowel movements after 48 hours.  7. Unless discharge instructions indicate otherwise, you may remove your bandages 48 hours after surgery, and you may shower at that time. You may have steri-strips (small white skin tapes) in place directly over the incision.  These strips should be left on the skin for 7-10 days.  If your surgeon used Dermabond (skin glue) on the incision, you may shower in 24 hours.  The glue will flake off over the next 2-3 weeks.  8. If your port is left accessed at the end of surgery (needle left in port), the dressing cannot get wet and should only by changed by a healthcare professional. When the port is no longer accessed (when the  needle has been removed), follow step 7.   9. ACTIVITIES:  Limit activity involving your arms for the next 72 hours. Do no strenuous exercise or activity for 1 week. You may drive when you are no longer taking prescription pain medication, you can comfortably wear a seatbelt, and you can maneuver your car. 10.You may need to see your doctor in the office for a follow-up appointment.  Please       check with your doctor.  11.When you receive a new Port-a-Cath, you will get a product guide and        ID card.  Please keep them in case you need them.  WHEN TO CALL YOUR DOCTOR ([phone]): 1. Fever over 101.0 2. Chills 3. Continued bleeding from incision 4. Increased redness and tenderness at the site 5. Shortness of breath, difficulty breathing   The clinic staff is available to answer your questions during regular business hours. Please don't hesitate to call and ask to speak to one of the nurses or medical assistants for clinical concerns. If you have a medical emergency, go to the nearest emergency room or call 911.  A surgeon from [facility] is always on call at the hospital.     For further information, please visit [email or web address]      

## 2013-03-23 NOTE — H&P (View-Only) (Signed)
Patient ID: Sydney Ford, female   DOB: Dec 07, 1955, 58 y.o.   MRN: [id number]  Chief Complaint  Patient presents with  . Other    breast cancer    HPI Sydney Ford is a 58 y.o. female.  Referred by Dr Ulyess Blossom HPI This is a 58 year old female who noticed a right breast mass as well as some tugging several months ago. She then eventually underwent a right breast mammogram that showed a 2.2 x 2.4 x 1.4 cm mass. The left side is negative. Her nodes are negative by ultrasound. She has no real prior history of any breast complaints except for some bleeding from her right nipple in 1984 that was deemed to be negative. She has no family history of breast ovarian cancer. She then underwent an MRI which showed a 3x2 x 1.7 irregular enhancing mass in the upper outer right breast consistent with a biopsy-proven cancer. The left breast is normal. There are no abnormal appearing lymph nodes. She is a diffusely enlarged thyroid gland that is consistent with her diagnosis of Hashimoto's. She underwent a biopsy of the right sided breast lesion which shows a triple negative breast cancer with a proliferation marker of 99%. 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 She comes in today to discuss her options. Past Medical History  Diagnosis Date  . Anemia   . Cancer   . Hypertension   . Hyperlipidemia   . Thyroid disease   . Osteoporosis     Past Surgical History  Procedure Laterality Date  . Cesarean section  1987  . Tonsillectomy      Family History  Problem Relation Age of Onset  . Heart failure Mother   . Heart attack Father     Social History History  Substance Use Topics  . Smoking status: Former Smoker    Quit date: 03/17/1985  . Smokeless tobacco: Not on file  . Alcohol Use: Not on file    Allergies  Allergen Reactions  . Codeine     REACTION: Nausea    Current Outpatient Prescriptions  Medication Sig Dispense Refill  . ARMOUR THYROID 60 MG tablet       .  atorvastatin (LIPITOR) 20 MG tablet       . chlorpheniramine-HYDROcodone (TUSSIONEX) 10-8 MG/5ML LQCR       . diclofenac sodium (VOLTAREN) 1 % GEL Apply 1 application topically 4 (four) times daily.  500 g  11  . lisinopril (PRINIVIL,ZESTRIL) 20 MG tablet       . LORazepam (ATIVAN) 0.5 MG tablet       . meloxicam (MOBIC) 15 MG tablet       . sertraline (ZOLOFT) 50 MG tablet       . traMADol (ULTRAM) 50 MG tablet        No current facility-administered medications for this visit.    Review of Systems Review of Systems  Constitutional: Negative for fever, chills and unexpected weight change.  HENT: Positive for voice change. Negative for congestion, hearing loss, sore throat and trouble swallowing.   Eyes: Negative for visual disturbance.  Respiratory: Positive for cough. Negative for wheezing.   Cardiovascular: Negative for chest pain, palpitations and leg swelling.  Gastrointestinal: Negative for nausea, vomiting, abdominal pain, diarrhea, constipation, blood in stool, abdominal distention and anal bleeding.  Genitourinary: Negative for hematuria, vaginal bleeding and difficulty urinating.  Musculoskeletal: Positive for arthralgias.  Skin: Negative for rash and wound.  Neurological: Negative for seizures, syncope and headaches.  Hematological:  Negative for adenopathy. Does not bruise/bleed easily.  Psychiatric/Behavioral: Negative for confusion.    Blood pressure 134/82, pulse 70, temperature 97.8 F (36.6 C), resp. rate 16, height 5\' 2"  (1.575 m), weight 231 lb 6.4 oz (104.962 kg).  Physical Exam Physical Exam  Vitals reviewed. Constitutional: She appears well-developed and well-nourished.  Cardiovascular: Normal rate, regular rhythm and normal heart sounds.   Pulmonary/Chest: Effort normal and breath sounds normal. She has no wheezes. She has no rales. Right breast exhibits mass. Right breast exhibits no inverted nipple, no nipple discharge, no skin change and no tenderness.  Left breast exhibits no inverted nipple, no mass, no nipple discharge, no skin change and no tenderness.    Lymphadenopathy:    She has no cervical adenopathy.    She has no axillary adenopathy.       Right: No supraclavicular adenopathy present.       Left: No supraclavicular adenopathy present.    Data Reviewed BILATERAL BREAST MRI WITH AND WITHOUT CONTRAST  LABS: BUN and creatinine were obtained on site at [facility] at  [address]  Results: BUN 15 mg/dL, Creatinine 0.9 mg/dL.  TECHNIQUE:  Multiplanar, multisequence MR images of both breasts were obtained  prior to and following the intravenous administration of 5ml of  MultiHance.  THREE-DIMENSIONAL MR IMAGE RENDERING ON INDEPENDENT WORKSTATION:  Three-dimensional MR images were rendered by post-processing of the  original MR data on an independent workstation. The  three-dimensional MR images were interpreted, and findings are  reported in the following complete MRI report for this study. Three  dimensional images were evaluated at the independent [employer]  workstation  COMPARISON: Previous exams  FINDINGS:  Breast composition: c: Heterogeneous fibroglandular tissue  Background parenchymal enhancement: Mild  Right breast: A 3 x 2 x 1.7 cm irregular enhancing mass is  identified in the upper-outer right breast, consistent with the  biopsy-proven invasive mammary carcinoma. The mass is associated  with mixed kinetics, which are predominantly washout and plateau.  Susceptibility artifact is identified within the mass, consistent  with the biopsy site marker. No additional areas of abnormal  enhancement.  Left breast: No mass or abnormal enhancement.  Lymph nodes: No abnormal appearing lymph nodes.  Ancillary findings: A diffusely enlarged thyroid gland is  identified. This was noted on a previous thyroid ultrasound in 2009.  Additionally, there is a T2 hyperintense lesion in close  approximation to the  thyroid isthmus. This measures 1.6 x 1.4 x 0.6  cm.  IMPRESSION:  1. A 3 x 2 x 1.7 cm irregularly enhancing mass in the upper-outer  right breast, consistent with the biopsy-proven invasive mammary  carcinoma.  2. T2 hyperintense lesion in close approximation to the thyroid  isthmus. This measures up to 1.6 cm. The thyroid is diffusely  enlarged, as seen on a prior thyroid ultrasound.  RECOMMENDATION:  Treatment planning for biopsy-proven right breast carcinoma. Thyroid  ultrasound is recommended for T2 hyperintense lesion in close  approximation to the thyroid isthmus.   Assessment    Clinical stage II right breast cancer, triple negative     Plan    I think that the best plan for her would be primary systemic chemotherapy we discussed the indications for that would be decreased size and make this more amenable to a lumpectomy, assess response of her tumor in situ, and maybe even better therapy for her triple negative for tumor to begin with. I told her that we would decide what surgery we  would need to do at the completion of her chemotherapy which would entail another MRI. We discussed the staging and pathophysiology of breast cancer. We discussed all of the different options for treatment for breast cancer including surgery, chemotherapy, radiation therapy, Herceptin, and antiestrogen therapy. She is not a candidate for last two.  I provided pathology copy.  We discussed a sentinel lymph node biopsy as she does not appear to having lymph node involvement right now. We discussed the performance of that with injection of radioactive tracer and blue dye.  We discussed that there is probably about a 20% chance of having a positive node with a sentinel lymph node biopsy. I think right now a mastectomy might be better option although could probably due a lumpectomy with decent result.  I think this could be made better with primary chemotherapy with no disadvantage in terms of cancer  treatment. She will see Dr Jana Hakim tomorrow.          Lenoir Facchini 03/14/2013, 3:12 PM

## 2013-03-23 NOTE — Anesthesia Postprocedure Evaluation (Signed)
  Anesthesia Post-op Note  Patient: Sydney Ford  Procedure(s) Performed: Procedure(s) (LRB): INSERTION PORT-A-CATH (N/A)  Patient Location: PACU  Anesthesia Type: General  Level of Consciousness: awake and alert   Airway and Oxygen Therapy: Patient Spontanous Breathing  Post-op Pain: mild  Post-op Assessment: Post-op Vital signs reviewed, Patient's Cardiovascular Status Stable, Respiratory Function Stable, Patent Airway and No signs of Nausea or vomiting  Last Vitals:  Filed Vitals:   03/23/13 1515  BP:   Pulse: 73  Temp:   Resp: 16    Post-op Vital Signs: stable   Complications: No apparent anesthesia complications

## 2013-03-23 NOTE — Progress Notes (Signed)
Portable Chest X-ray done.

## 2013-03-23 NOTE — Interval H&P Note (Signed)
History and Physical Interval Note:  03/23/2013 1:31 PM  Sydney Ford  has presented today for surgery, with the diagnosis of breast cancer  The various methods of treatment have been discussed with the patient and family. After consideration of risks, benefits and other options for treatment, the patient has consented to  Procedure(s): INSERTION PORT-A-CATH (N/A) as a surgical intervention .  The patient's history has been reviewed, patient examined, no change in status, stable for surgery.  I have reviewed the patient's chart and labs.  Questions were answered to the patient's satisfaction.     Neil Brickell

## 2013-03-23 NOTE — Anesthesia Preprocedure Evaluation (Signed)
Anesthesia Evaluation  Patient identified by MRN, date of birth, ID band Patient awake    Reviewed: Allergy & Precautions, H&P , NPO status , Patient's Chart, lab work & pertinent test results  Airway Mallampati: II TM Distance: >3 FB Neck ROM: Full    Dental no notable dental hx.    Pulmonary asthma , former smoker,  breath sounds clear to auscultation  Pulmonary exam normal       Cardiovascular hypertension, Pt. on medications Rhythm:Regular Rate:Normal     Neuro/Psych negative neurological ROS  negative psych ROS   GI/Hepatic Neg liver ROS, PUD,   Endo/Other  Hypothyroidism Morbid obesity  Renal/GU negative Renal ROS  negative genitourinary   Musculoskeletal negative musculoskeletal ROS (+)   Abdominal   Peds negative pediatric ROS (+)  Hematology negative hematology ROS (+)   Anesthesia Other Findings   Reproductive/Obstetrics negative OB ROS                           Anesthesia Physical Anesthesia Plan  ASA: II  Anesthesia Plan: General   Post-op Pain Management:    Induction: Intravenous  Airway Management Planned: LMA  Additional Equipment:   Intra-op Plan:   Post-operative Plan:   Informed Consent: I have reviewed the patients History and Physical, chart, labs and discussed the procedure including the risks, benefits and alternatives for the proposed anesthesia with the patient or authorized representative who has indicated his/her understanding and acceptance.   Dental advisory given  Plan Discussed with: CRNA and Surgeon  Anesthesia Plan Comments:         Anesthesia Quick Evaluation

## 2013-03-23 NOTE — Op Note (Signed)
Preoperative diagnosis: Clinical stage II right breast cancer Postoperative diagnosis: Same as above Procedure: Left subclavian power port insertion Surgeon: Dr. Serita Grammes Anesthesia: Gen. With LMA Estimated blood loss: Minimal Specimens: None Drains: None Complications: None Sponge and needle count correct at completion Disposition to recovery stable  Indications: This is a 58 year old female who has recent diagnosed with a clinical stage II right breast cancer. In attempt to perform breast conservation therapy we have discussed beginning with chemotherapy to try to shrink this. She has been seen by medical oncology we are all agreed on her treatment. We have discussed port placement to begin her chemotherapy.  Procedure: After informed consent was obtained the patient was taken to the operating room. She was given cefazolin. Sequential compression devices were on her legs. She was placed under general anesthesia without complication. Her arms were tucked and appropriately padded. Her chest was then prepped and draped in the standard sterile surgical fashion. A surgical timeout was then performed.  I infiltrated Marcaine throughout the left chest wall. I then accessed the subclavian vein on the first pass. A wire was placed. This was confirmed to be in position by fluoroscopy. I then made an incision below this and made a pocket. I then tunneled the line between the 2 sites. I then inserted the dilator assembly. I passed this under direct vision with fluoroscopy. Once this was in position I removed the wire. I then placed the line. The peel-away sheath was then removed. I then pulled the line back to be in the distal cava. I then attached the line to the port. The port was placed in the pocket and sutured into position with 3 2-0 Prolene sutures. The port flushed easily and aspirated blood. I placed separate inside the port. I then closed with 3-0 Vicryl, 4 Monocryl, and Dermabond. This was  easily palpable. Final fluoroscopy showed it to be in good position with no kinks in the line. She was then extubated and transferred to recovery stable.

## 2013-03-24 ENCOUNTER — Encounter (HOSPITAL_COMMUNITY): Payer: Self-pay | Admitting: General Surgery

## 2013-03-28 ENCOUNTER — Telehealth: Payer: Self-pay | Admitting: *Deleted

## 2013-03-30 ENCOUNTER — Other Ambulatory Visit: Payer: Self-pay | Admitting: Physician Assistant

## 2013-03-30 DIAGNOSIS — C50411 Malignant neoplasm of upper-outer quadrant of right female breast: Secondary | ICD-10-CM

## 2013-03-31 ENCOUNTER — Telehealth: Payer: Self-pay | Admitting: *Deleted

## 2013-03-31 ENCOUNTER — Other Ambulatory Visit: Payer: Self-pay | Admitting: Oncology

## 2013-03-31 ENCOUNTER — Ambulatory Visit (HOSPITAL_BASED_OUTPATIENT_CLINIC_OR_DEPARTMENT_OTHER): Payer: BC Managed Care – PPO | Admitting: Physician Assistant

## 2013-03-31 ENCOUNTER — Encounter: Payer: Self-pay | Admitting: Physician Assistant

## 2013-03-31 ENCOUNTER — Other Ambulatory Visit: Payer: BC Managed Care – PPO | Admitting: *Deleted

## 2013-03-31 ENCOUNTER — Ambulatory Visit (HOSPITAL_BASED_OUTPATIENT_CLINIC_OR_DEPARTMENT_OTHER): Payer: BC Managed Care – PPO

## 2013-03-31 ENCOUNTER — Other Ambulatory Visit (HOSPITAL_BASED_OUTPATIENT_CLINIC_OR_DEPARTMENT_OTHER): Payer: BC Managed Care – PPO

## 2013-03-31 ENCOUNTER — Encounter: Payer: Self-pay | Admitting: Oncology

## 2013-03-31 VITALS — BP 139/86 | HR 74 | Temp 97.8°F | Resp 20 | Ht 62.0 in | Wt 225.5 lb

## 2013-03-31 DIAGNOSIS — M810 Age-related osteoporosis without current pathological fracture: Secondary | ICD-10-CM

## 2013-03-31 DIAGNOSIS — C50419 Malignant neoplasm of upper-outer quadrant of unspecified female breast: Secondary | ICD-10-CM

## 2013-03-31 DIAGNOSIS — K219 Gastro-esophageal reflux disease without esophagitis: Secondary | ICD-10-CM

## 2013-03-31 DIAGNOSIS — C50411 Malignant neoplasm of upper-outer quadrant of right female breast: Secondary | ICD-10-CM

## 2013-03-31 DIAGNOSIS — Z5111 Encounter for antineoplastic chemotherapy: Secondary | ICD-10-CM

## 2013-03-31 DIAGNOSIS — Z171 Estrogen receptor negative status [ER-]: Secondary | ICD-10-CM

## 2013-03-31 DIAGNOSIS — F411 Generalized anxiety disorder: Secondary | ICD-10-CM

## 2013-03-31 DIAGNOSIS — F419 Anxiety disorder, unspecified: Secondary | ICD-10-CM

## 2013-03-31 DIAGNOSIS — R110 Nausea: Secondary | ICD-10-CM

## 2013-03-31 LAB — COMPREHENSIVE METABOLIC PANEL (CC13)
ALBUMIN: 4.1 g/dL (ref 3.5–5.0)
ALK PHOS: 85 U/L (ref 40–150)
ALT: 23 U/L (ref 0–55)
AST: 16 U/L (ref 5–34)
Anion Gap: 12 mEq/L — ABNORMAL HIGH (ref 3–11)
BUN: 21.8 mg/dL (ref 7.0–26.0)
CALCIUM: 10 mg/dL (ref 8.4–10.4)
CHLORIDE: 106 meq/L (ref 98–109)
CO2: 23 mEq/L (ref 22–29)
Creatinine: 1 mg/dL (ref 0.6–1.1)
Glucose: 105 mg/dl (ref 70–140)
POTASSIUM: 4.4 meq/L (ref 3.5–5.1)
Sodium: 140 mEq/L (ref 136–145)
Total Bilirubin: 0.79 mg/dL (ref 0.20–1.20)
Total Protein: 7.5 g/dL (ref 6.4–8.3)

## 2013-03-31 LAB — CBC WITH DIFFERENTIAL/PLATELET
BASO%: 0.7 % (ref 0.0–2.0)
Basophils Absolute: 0 10*3/uL (ref 0.0–0.1)
EOS%: 2.5 % (ref 0.0–7.0)
Eosinophils Absolute: 0.2 10*3/uL (ref 0.0–0.5)
HCT: 45.4 % (ref 34.8–46.6)
HGB: 15.7 g/dL (ref 11.6–15.9)
LYMPH%: 20.7 % (ref 14.0–49.7)
MCH: 30.5 pg (ref 25.1–34.0)
MCHC: 34.6 g/dL (ref 31.5–36.0)
MCV: 88.1 fL (ref 79.5–101.0)
MONO#: 0.7 10*3/uL (ref 0.1–0.9)
MONO%: 9.7 % (ref 0.0–14.0)
NEUT#: 4.7 10*3/uL (ref 1.5–6.5)
NEUT%: 66.4 % (ref 38.4–76.8)
Platelets: 215 10*3/uL (ref 145–400)
RBC: 5.16 10*6/uL (ref 3.70–5.45)
RDW: 13.2 % (ref 11.2–14.5)
WBC: 7 10*3/uL (ref 3.9–10.3)
lymph#: 1.5 10*3/uL (ref 0.9–3.3)

## 2013-03-31 MED ORDER — PALONOSETRON HCL INJECTION 0.25 MG/5ML
0.2500 mg | Freq: Once | INTRAVENOUS | Status: AC
  Administered 2013-03-31: 0.25 mg via INTRAVENOUS

## 2013-03-31 MED ORDER — SODIUM CHLORIDE 0.9 % IV SOLN
150.0000 mg | Freq: Once | INTRAVENOUS | Status: AC
  Administered 2013-03-31: 150 mg via INTRAVENOUS
  Filled 2013-03-31: qty 5

## 2013-03-31 MED ORDER — DEXAMETHASONE SODIUM PHOSPHATE 20 MG/5ML IJ SOLN
12.0000 mg | Freq: Once | INTRAMUSCULAR | Status: AC
  Administered 2013-03-31: 12 mg via INTRAVENOUS

## 2013-03-31 MED ORDER — HEPARIN SOD (PORK) LOCK FLUSH 100 UNIT/ML IV SOLN
500.0000 [IU] | Freq: Once | INTRAVENOUS | Status: AC | PRN
  Administered 2013-03-31: 500 [IU]
  Filled 2013-03-31: qty 5

## 2013-03-31 MED ORDER — SODIUM CHLORIDE 0.9 % IJ SOLN
10.0000 mL | INTRAMUSCULAR | Status: DC | PRN
  Administered 2013-03-31: 10 mL
  Filled 2013-03-31: qty 10

## 2013-03-31 MED ORDER — ACETAMINOPHEN 325 MG PO TABS
650.0000 mg | ORAL_TABLET | Freq: Once | ORAL | Status: AC
  Administered 2013-03-31: 650 mg via ORAL

## 2013-03-31 MED ORDER — PALONOSETRON HCL INJECTION 0.25 MG/5ML
INTRAVENOUS | Status: AC
Start: 2013-03-31 — End: 2013-03-31
  Filled 2013-03-31: qty 5

## 2013-03-31 MED ORDER — ACETAMINOPHEN 325 MG PO TABS
ORAL_TABLET | ORAL | Status: AC
  Filled 2013-03-31: qty 2

## 2013-03-31 MED ORDER — SODIUM CHLORIDE 0.9 % IV SOLN
Freq: Once | INTRAVENOUS | Status: AC
  Administered 2013-03-31: 12:00:00 via INTRAVENOUS

## 2013-03-31 MED ORDER — OMEPRAZOLE 40 MG PO CPDR: 40.0000 mg | DELAYED_RELEASE_CAPSULE | Freq: Every day | ORAL | Status: DC

## 2013-03-31 MED ORDER — DOXORUBICIN HCL CHEMO IV INJECTION 2 MG/ML
60.0000 mg/m2 | Freq: Once | INTRAVENOUS | Status: AC
  Administered 2013-03-31: 128 mg via INTRAVENOUS
  Filled 2013-03-31: qty 64

## 2013-03-31 MED ORDER — LORAZEPAM 2 MG/ML IJ SOLN
INTRAMUSCULAR | Status: AC
  Filled 2013-03-31: qty 1

## 2013-03-31 MED ORDER — DEXAMETHASONE SODIUM PHOSPHATE 20 MG/5ML IJ SOLN
INTRAMUSCULAR | Status: AC
  Filled 2013-03-31: qty 5

## 2013-03-31 MED ORDER — SODIUM CHLORIDE 0.9 % IV SOLN
600.0000 mg/m2 | Freq: Once | INTRAVENOUS | Status: AC
  Administered 2013-03-31: 1280 mg via INTRAVENOUS
  Filled 2013-03-31: qty 64

## 2013-03-31 MED ORDER — LORAZEPAM 2 MG/ML IJ SOLN
1.0000 mg | Freq: Once | INTRAMUSCULAR | Status: AC
  Administered 2013-03-31: 1 mg via INTRAVENOUS

## 2013-03-31 NOTE — Telephone Encounter (Signed)
appts made and printed. Pt is aware that tx will be added. i emailed MW to add the tx...td 

## 2013-03-31 NOTE — Progress Notes (Signed)
Sydney Ford  [phone] Fax:(336) [phone]     ID: MIRANDA FRESE OB: 1956/03/15  MR#: [id number]  XAJ#:[id number]  PCP: Dwan Bolt, MD GYN:   SU: Sydney Bookbinder, MD OTHER MD: Sydney Blossom, MD  CHIEF COMPLAINT:  Right breast cancer/neoadjuvant chemotherapy   HISTORY OF PRESENT ILLNESS: Sydney Ford (pronounced "line 'em") herself noted a change in her right breast sometime October 2014 but "my breasts are like bean bags" and she initially ignored it. After while she began to feel a pole when she lay on her right side at night so she scheduled herself for screening mammography at [facility] 02/08/2013 (prior mammogram had been April 2011), and this did show a possible mass in the right breast. Unilateral right diagnostic mammography and ultrasonography 02/28/2013 confirmed an irregular mass in the right breast upper outer quadrant measuring 2.4 cm. This was palpable at the 11:00 position. Ultrasound showed an irregular hypoechoic mass measuring 1.5 cm with no abnormal adenopathy in the right axilla.  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  The patient met with Sydney Ford 03/14/2013 and he recommended primary systemic chemotherapy to decrease the size of the tumor and increase the chance of lumpectomy.   INTERVAL HISTORY: Sydney Ford returns today accompanied by her husband Sydney Ford for followup of her locally advanced right breast carcinoma. She is due to initiate neoadjuvant chemotherapy today, day 1 cycle 1 of 4 planned dose dense cycles of doxorubicin/cyclophosphamide. She will be receiving Neulasta on day 2 for granulocyte support.  At baseline, Sydney Ford tells me she always has "a little bit of nausea", but does not vomit. She's currently not on  anything for acid reduction. She does feel a little anxious, especially about the chemotherapy.  She has all of her antinausea medications on hand at home, but is a little unsure about how to utilize them appropriately.  REVIEW OF SYSTEMS: Physically, Sydney Ford has had no recent illnesses and denies any fevers or chills. She denies any recent skin changes and has had no abnormal bleeding. She has regular bowel movements, and denies any recent change in bowel or bladder habits. She does have shortness of breath with exertion, but has had no recent cough, phlegm production, or orthopnea. She's had no chest pain or palpitations. She's had no recent headaches and denies any dizziness or change in vision. She has some chronic arthritis which causes joint pain, and she also has chronic lower back pain, both of which are stable. She denies any peripheral swelling.   A detailed review of systems today was otherwise noncontributory  PAST MEDICAL HISTORY: Past Medical History  Diagnosis Date  . Hypertension   . Hyperlipidemia   . Thyroid disease   . Osteoporosis   . Hypothyroidism     hashimoto's  . Depression   . Pneumonia     2014  . Cold (disease) 03/21/12    couple of weeks ago - pt states much better now  . History of kidney stones     "passed 9 stones"  . GERD (gastroesophageal reflux disease)     no meds except occas tums  . Headache(784.0)     occas migraine  . Cancer     new dx of right breast cancer - planning chemo first  . Arthritis     oa; lower back  pain - hx of injury yrs ago-occas flare ups  . Anemia     2005 - related to heavy menses with the thyroid probl    PAST SURGICAL HISTORY: Past Surgical History  Procedure Laterality Date  . Cesarean section  1987  . Tonsillectomy    . Uterine ablation  2008    no menstrual periods since procedure  . Portacath placement N/A 03/23/2013    Procedure: INSERTION PORT-A-CATH;  Surgeon: Sydney Bookbinder, MD;  Location: [facility];  Service:  General;  Laterality: N/A;    FAMILY HISTORY Family History  Problem Relation Age of Onset  . Heart failure Mother   . Heart attack Father    the patient's father died at the age of 14 from a myocardial infarction. The patient's mother died at the age of 41 with congestive heart failure. She also had a history of lupus. The patient had no brothers or sisters. There is no history of breast or ovarian cancer in the family to her knowledge.  GYNECOLOGIC HISTORY:  Menarche age 30, first live birth age 78. She is GX P2. She underwent endometrial ablation in 2009 and has not had a period since that time. She never took hormone replacement. She did use birth control pills remotely with no complications  SOCIAL HISTORY: (Updated January 2015) Sydney Ford works at [employer] for a variety of long-term clients. She does not work for a company. Her husband Sydney Ford "Sydney Ford" Sydney Ford is semiretired. He works for a company that [employer] data on [place]. Son Sydney Ford is in [place] and works in Architect. Son Sydney Ford lives at home and is disabled with a diagnosis of paranoid schizophrenia. The patient has no grandchildren. She is not a church attender    ADVANCED DIRECTIVES: Not in place   HEALTH MAINTENANCE: (Updated January 2015) History  Substance Use Topics  . Smoking status: Former Smoker    Quit date: 03/17/1985  . Smokeless tobacco: Never Used  . Alcohol Use: No     Colonoscopy: Never  PAP: Remote  Bone density: November 2014, showed osteoporosis   according to the patient's report  Lipid panel:  Not on file, Dr. Wilson Singer   Allergies  Allergen Reactions  . Codeine Itching and Nausea Only    REACTION: Nausea and itching  . Other     Close contact with Some recycled plastics cause whelps    Current Outpatient Prescriptions  Medication Sig Dispense Refill  . atorvastatin (LIPITOR) 20 MG tablet Take 20 mg by mouth every evening.       . Cholecalciferol (VITAMIN D) 2000 UNITS CAPS  Take 6,000 Units by mouth every morning.      . lidocaine-prilocaine (EMLA) cream Apply 1 application topically as needed.  30 g  0  . lisinopril (PRINIVIL,ZESTRIL) 20 MG tablet Take 20 mg by mouth every morning.       Marland Kitchen LORazepam (ATIVAN) 0.5 MG tablet Take 0.5 mg by mouth every 8 (eight) hours as needed for anxiety (or nausea).       . meloxicam (MOBIC) 15 MG tablet Take 15 mg by mouth every morning.       . sertraline (ZOLOFT) 50 MG tablet Take 50 mg by mouth at bedtime.       Marland Kitchen thyroid (ARMOUR) 90 MG tablet Take 90 mg by mouth every morning.      . traMADol (ULTRAM) 50 MG tablet Take 50 mg by mouth 3 (three) times daily as needed for moderate pain or severe pain.       Marland Kitchen  dexamethasone (DECADRON) 4 MG tablet Take 2 tablets by mouth once a day on the day after chemotherapy and then take 2 tablets two times a day for 2 days. Take with food.  30 tablet  1  . omeprazole (PRILOSEC) 40 MG capsule Take 1 capsule (40 mg total) by mouth daily.  30 capsule  4  . ondansetron (ZOFRAN) 8 MG tablet Take 1 tablet (8 mg total) by mouth 2 (two) times daily as needed. Take two times a day as needed for nausea or vomiting starting on the third day after chemotherapy.  30 tablet  1  . prochlorperazine (COMPAZINE) 10 MG tablet Take 1 tablet (10 mg total) by mouth every 6 (six) hours as needed (Nausea or vomiting).  30 tablet  1   No current facility-administered medications for this visit.   Facility-Administered Medications Ordered in Other Visits  Medication Dose Route Frequency Provider Last Rate Last Dose  . sodium chloride 0.9 % injection 10 mL  10 mL Intracatheter PRN Numa Schroeter Milda Smart, PA-C        OBJECTIVE: Middle-aged white woman who appears anxious but is in no acute distress Filed Vitals:   03/31/13 0943  BP: 139/86  Pulse: 74  Temp: 97.8 F (36.6 C)  Resp: 20     Body mass index is 41.23 kg/(m^2).    ECOG FS:0 - Asymptomatic Filed Weights   03/31/13 0943  Weight: 225 lb 8 oz (102.286 kg)    Physical Exam: HEENT:  Sclerae anicteric.  Oropharynx clear and moist. Neck is supple. Trachea midline. No thyromegaly. NODES:  No cervical or supraclavicular lymphadenopathy palpated.  BREAST EXAM:  Deferred. No axillary lymphadenopathy on either the right or the left. LUNGS:  Clear to auscultation bilaterally with good excursion.  No wheezes or rhonchi HEART:  Regular rate and rhythm. No murmur  ABDOMEN:  Soft, obese, nontender.  Positive bowel sounds.  MSK:  No focal spinal tenderness to palpation. Good range of motion bilaterally in the upper extremities. EXTREMITIES:  No peripheral edema.   SKIN:  Benign with no notable skin lesions or rashes. Good skin turgor NEURO:  Nonfocal. Well oriented.  Anxious affect.     LAB RESULTS:  Lab Results  Component Value Date   WBC 7.0 03/31/2013   NEUTROABS 4.7 03/31/2013   HGB 15.7 03/31/2013   HCT 45.4 03/31/2013   MCV 88.1 03/31/2013   PLT 215 03/31/2013      Chemistry      Component Value Date/Time   NA 140 03/31/2013 0918   NA 141 08/16/2007 0833   K 4.4 03/31/2013 0918   K 4.5 08/16/2007 0833   CL 105 08/16/2007 0833   CO2 23 03/31/2013 0918   CO2 27 08/16/2007 0833   BUN 21.8 03/31/2013 0918   BUN 14 08/16/2007 0833   CREATININE 1.0 03/31/2013 0918   CREATININE 0.9 08/16/2007 0833      Component Value Date/Time   CALCIUM 10.0 03/31/2013 0918   CALCIUM 9.7 08/16/2007 0833   ALKPHOS 85 03/31/2013 0918   ALKPHOS 64 08/16/2007 0833   AST 16 03/31/2013 0918   AST 25 08/16/2007 0833   ALT 23 03/31/2013 0918   ALT 30 08/16/2007 0833   BILITOT 0.79 03/31/2013 0918   BILITOT 1.0 08/16/2007 0833       STUDIES:   Mr Breast Bilateral W Wo Contrast  03/08/2013   CLINICAL DATA:  Recent diagnosis of invasive mammary carcinoma following ultrasound-guided biopsy of a right breast mass at 11  o'clock.  EXAM: BILATERAL BREAST MRI WITH AND WITHOUT CONTRAST  LABS:  BUN and creatinine were obtained on site at [facility] at  [address]  Results:   BUN 15 mg/dL,  Creatinine 0.9 mg/dL.  TECHNIQUE: Multiplanar, multisequence MR images of both breasts were obtained prior to and following the intravenous administration of 59m of MultiHance.  THREE-DIMENSIONAL MR IMAGE RENDERING ON INDEPENDENT WORKSTATION:  Three-dimensional MR images were rendered by post-processing of the original MR data on an independent workstation. The three-dimensional MR images were interpreted, and findings are reported in the following complete MRI report for this study. Three dimensional images were evaluated at the independent DynaCad workstation  COMPARISON:  Previous exams  FINDINGS: Breast composition: c:  Heterogeneous fibroglandular tissue  Background parenchymal enhancement: Mild  Right breast: A 3 x 2 x 1.7 cm irregular enhancing mass is identified in the upper-outer right breast, consistent with the biopsy-proven invasive mammary carcinoma. The mass is associated with mixed kinetics, which are predominantly washout and plateau. Susceptibility artifact is identified within the mass, consistent with the biopsy site marker. No additional areas of abnormal enhancement.  Left breast: No mass or abnormal enhancement.  Lymph nodes: No abnormal appearing lymph nodes.  Ancillary findings: A diffusely enlarged thyroid gland is identified. This was noted on a previous thyroid ultrasound in 2009. Additionally, there is a T2 hyperintense lesion in close approximation to the thyroid isthmus. This measures 1.6 x 1.4 x 0.6 cm.  IMPRESSION: 1. A 3 x 2 x 1.7 cm irregularly enhancing mass in the upper-outer right breast, consistent with the biopsy-proven invasive mammary carcinoma. 2. T2 hyperintense lesion in close approximation to the thyroid isthmus. This measures up to 1.6 cm. The thyroid is diffusely enlarged, as seen on a prior thyroid ultrasound.  RECOMMENDATION: Treatment planning for biopsy-proven right breast carcinoma. Thyroid ultrasound is recommended for T2 hyperintense lesion in  close approximation to the thyroid isthmus.  BI-RADS CATEGORY  6: Known biopsy-proven malignancy - appropriate action should be taken.   Electronically Signed   By: MDonavan BurnetM.D.   On: 03/08/2013 15:29     ASSESSMENT: 58y.o. 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  (2)  being treated in the neoadjuvant setting, the plan being to complete 4 dose dense cycles of doxorubicin/cyclophosphamide, first dose on 03/31/2013. She will be given Neulasta on day 2 for granulocyte support. This regimen will be followed by 12 weekly doses of carboplatin/paclitaxel prior to definitive surgery.   PLAN: Over half of our 40 minute appointment today was spent answering pounds questions and addressing her concerns, reviewing her antinausea regimen, reviewing her treatment plan, and coordinating care.   We reviewed all of her antinausea medications which will include: Dexamethasone (8 mg by mouth twice a day x3 days after each chemotherapy); prochlorperazine (10 mg by mouth with meals and bedtime x3 days after chemotherapy, then one tablet every 6 hours as needed for nausea); and ondansetron (8 mg by mouth twice daily only if needed). She is already on lorazepam and will continue taking that as needed, especially at night to help her sleep. We also discussed adding some IV lorazepam to her premeds which I think will help with both the nausea and the anxiety. We discussed taking Claritin, 10 mg, for 5-7 days following the Neulasta injection to decrease bone pain. She also  has her EMLA cream and understands how to utilize it appropriately.   With her mild chronic nausea, I think some omeprazole might be helpful, especially since we are also starting her on oral steroids. I will prescribed omeprazole, 40 mg daily, and that has been sent to her pharmacy today.  All of the  above was reviewed in detail with the patient and her husband today. She was given all information in writing and voiced her understanding and agreement with our plan. She will return tomorrow for her Neulasta injection on day 2, and I will see her again next week for assessment of chemotoxicity on January 21. She will call prior that appointment with any changes or problems.  She does understand that the goal of treatment is cure.   Zaria Taha, PA-C   03/31/2013 12:52 PM

## 2013-03-31 NOTE — Patient Instructions (Signed)
[facility] Discharge Instructions for Patients Receiving Chemotherapy  Today you received the following chemotherapy agents Adriamycin/Cytoxan.   To help prevent nausea and vomiting after your treatment, we encourage you to take your nausea medication as directed.    If you develop nausea and vomiting that is not controlled by your nausea medication, call the clinic.   BELOW ARE SYMPTOMS THAT SHOULD BE REPORTED IMMEDIATELY:  *FEVER GREATER THAN 100.5 F  *CHILLS WITH OR WITHOUT FEVER  NAUSEA AND VOMITING THAT IS NOT CONTROLLED WITH YOUR NAUSEA MEDICATION  *UNUSUAL SHORTNESS OF BREATH  *UNUSUAL BRUISING OR BLEEDING  TENDERNESS IN MOUTH AND THROAT WITH OR WITHOUT PRESENCE OF ULCERS  *URINARY PROBLEMS  *BOWEL PROBLEMS  UNUSUAL RASH Items with * indicate a potential emergency and should be followed up as soon as possible.  Feel free to call the clinic you have any questions or concerns. The clinic phone number is [phone].    

## 2013-03-31 NOTE — Progress Notes (Signed)
Wife/hubby to bring me back bank statement for possible 600.00 and 400.00 grant. He inquired about financial asst. I gave application, but can't apply now-- no balance. Also signed up for Neulasta.

## 2013-03-31 NOTE — Telephone Encounter (Signed)
Per staff message and POF I have scheduled appts.  JMW  

## 2013-03-31 NOTE — Progress Notes (Signed)
Patient began complaining of siunus burning and severe headache across forehead and behind eyes approx 5 minutes before the ending of her Cytoxan. Based on the dosing, it is likely this is what the onset of headache is from. Spoke with pharmacist Vista Lawman, he will note this on pharmacy chart and suggest 1 hour infusion, even though we infused over 45 minutes as precaution today. Upon removing todays PAC IV access, noted that end of incision lines from port placement last week seem to be slowing closing, and not as healed as normally expected. Scrubbed area with additional chlorhexascrub and applied skin prep to skin then applied reinforcing steri-strips to incision lines. Instructed patient not to apply any pressure to this area for the next few days and allow the strips to raise and come off on their own. If she notes any drainage, redness, soreness or anything unusual, she is to call [facility] and have Dr Donne Hazel see her. Patient and spouse verbalized understanding.

## 2013-04-01 ENCOUNTER — Telehealth: Payer: Self-pay | Admitting: *Deleted

## 2013-04-01 ENCOUNTER — Ambulatory Visit (HOSPITAL_BASED_OUTPATIENT_CLINIC_OR_DEPARTMENT_OTHER): Payer: BC Managed Care – PPO

## 2013-04-01 VITALS — BP 142/77 | HR 80 | Temp 98.2°F

## 2013-04-01 DIAGNOSIS — C50411 Malignant neoplasm of upper-outer quadrant of right female breast: Secondary | ICD-10-CM

## 2013-04-01 DIAGNOSIS — Z5189 Encounter for other specified aftercare: Secondary | ICD-10-CM

## 2013-04-01 DIAGNOSIS — C50419 Malignant neoplasm of upper-outer quadrant of unspecified female breast: Secondary | ICD-10-CM

## 2013-04-01 MED ORDER — PEGFILGRASTIM INJECTION 6 MG/0.6ML
6.0000 mg | Freq: Once | SUBCUTANEOUS | Status: AC
  Administered 2013-04-01: 6 mg via SUBCUTANEOUS
  Filled 2013-04-01: qty 0.6

## 2013-04-01 NOTE — Telephone Encounter (Signed)
Sydney Ford here for Neulasta injection following 1st AC chemotherapy.  States that she is having a severe headache and nausea.   No vomiting or diarrhea.  Is trying to eat and drink but not getting much in.  Encouraged to drink more fluids,  She has only drank about 24 ounce at this point.  Reviewed use of antiemetics and what to do if she has any problems.  All questions answered   Knows to call if she has any problems.

## 2013-04-01 NOTE — Telephone Encounter (Signed)
Pt called late pm stating ongoing headache since chemo yesterday unrelieved with " list of medications that AB/PA gave me instructions to do ".  Pt states in addition to regiman given by AB/PA she has taken an zofran " thinking it might help ".  This RN discussed above headache may be secondary to the zofran given yesterday as well as additional dose she took today.  Pt is not nauseated .  This RN informed pt benefit of benadryl for headache which pt states " I took one of them last night and this morning "  With further discussion plan is for pt to take 2 benadryl now and if needed a tramadol.  Pt is safe in her home and understands major side effect is sleepiness but goal is for her to sleep and get headache alleviated.  Pt understands to call over the weekend if needed.

## 2013-04-01 NOTE — Patient Instructions (Signed)

## 2013-04-06 ENCOUNTER — Ambulatory Visit (HOSPITAL_BASED_OUTPATIENT_CLINIC_OR_DEPARTMENT_OTHER): Payer: BC Managed Care – PPO | Admitting: Physician Assistant

## 2013-04-06 ENCOUNTER — Other Ambulatory Visit (HOSPITAL_BASED_OUTPATIENT_CLINIC_OR_DEPARTMENT_OTHER): Payer: BC Managed Care – PPO

## 2013-04-06 ENCOUNTER — Telehealth: Payer: Self-pay | Admitting: Oncology

## 2013-04-06 ENCOUNTER — Encounter: Payer: Self-pay | Admitting: Physician Assistant

## 2013-04-06 VITALS — BP 142/74 | HR 73 | Temp 98.8°F | Resp 18 | Ht 62.0 in | Wt 227.8 lb

## 2013-04-06 DIAGNOSIS — C50411 Malignant neoplasm of upper-outer quadrant of right female breast: Secondary | ICD-10-CM

## 2013-04-06 DIAGNOSIS — C50419 Malignant neoplasm of upper-outer quadrant of unspecified female breast: Secondary | ICD-10-CM

## 2013-04-06 DIAGNOSIS — M545 Low back pain, unspecified: Secondary | ICD-10-CM

## 2013-04-06 DIAGNOSIS — F329 Major depressive disorder, single episode, unspecified: Secondary | ICD-10-CM

## 2013-04-06 DIAGNOSIS — M810 Age-related osteoporosis without current pathological fracture: Secondary | ICD-10-CM

## 2013-04-06 DIAGNOSIS — D702 Other drug-induced agranulocytosis: Secondary | ICD-10-CM

## 2013-04-06 DIAGNOSIS — R51 Headache: Secondary | ICD-10-CM

## 2013-04-06 DIAGNOSIS — F3289 Other specified depressive episodes: Secondary | ICD-10-CM

## 2013-04-06 DIAGNOSIS — F32A Depression, unspecified: Secondary | ICD-10-CM

## 2013-04-06 DIAGNOSIS — G8929 Other chronic pain: Secondary | ICD-10-CM

## 2013-04-06 DIAGNOSIS — Z171 Estrogen receptor negative status [ER-]: Secondary | ICD-10-CM

## 2013-04-06 LAB — CBC WITH DIFFERENTIAL/PLATELET
BASO%: 1.3 % (ref 0.0–2.0)
Basophils Absolute: 0 10*3/uL (ref 0.0–0.1)
EOS%: 6.6 % (ref 0.0–7.0)
Eosinophils Absolute: 0.1 10*3/uL (ref 0.0–0.5)
HCT: 40.7 % (ref 34.8–46.6)
HEMOGLOBIN: 13.9 g/dL (ref 11.6–15.9)
LYMPH%: 62.5 % — ABNORMAL HIGH (ref 14.0–49.7)
MCH: 29.8 pg (ref 25.1–34.0)
MCHC: 34.2 g/dL (ref 31.5–36.0)
MCV: 87.3 fL (ref 79.5–101.0)
MONO#: 0 10*3/uL — AB (ref 0.1–0.9)
MONO%: 1.3 % (ref 0.0–14.0)
NEUT%: 28.3 % — ABNORMAL LOW (ref 38.4–76.8)
NEUTROS ABS: 0.4 10*3/uL — AB (ref 1.5–6.5)
Platelets: 101 10*3/uL — ABNORMAL LOW (ref 145–400)
RBC: 4.66 10*6/uL (ref 3.70–5.45)
RDW: 12.7 % (ref 11.2–14.5)
WBC: 1.5 10*3/uL — AB (ref 3.9–10.3)
lymph#: 1 10*3/uL (ref 0.9–3.3)

## 2013-04-06 MED ORDER — CIPROFLOXACIN HCL 500 MG PO TABS: 500.0000 mg | ORAL_TABLET | Freq: Two times a day (BID) | ORAL | Status: DC

## 2013-04-06 MED ORDER — SERTRALINE HCL 100 MG PO TABS: 100.0000 mg | ORAL_TABLET | Freq: Every day | ORAL | Status: DC

## 2013-04-06 NOTE — Telephone Encounter (Signed)
, °

## 2013-04-06 NOTE — Progress Notes (Signed)
Sydney Ford  [phone] [phone]     ID: Sydney Ford OB: 09/26/1955  MR#: [id number]  JME#:[id number]  PCP: Sydney Bolt, MD GYN:   SU: Sydney Bookbinder, MD OTHER MD: Sydney Blossom, MD  CHIEF COMPLAINT:  Right breast cancer/neoadjuvant chemotherapy   HISTORY OF PRESENT ILLNESS: Sydney Ford (pronounced "line 'em") herself noted a change in her right breast sometime October 2014 but "my breasts are like bean bags" and she initially ignored it. After while she began to feel a pole when she lay on her right side at night so she scheduled herself for screening mammography at [facility] 02/08/2013 (prior mammogram had been April 2011), and this did show a possible mass in the right breast. Unilateral right diagnostic mammography and ultrasonography 02/28/2013 confirmed an irregular mass in the right breast upper outer quadrant measuring 2.4 cm. This was palpable at the 11:00 position. Ultrasound showed an irregular hypoechoic mass measuring 1.5 cm with no abnormal adenopathy in the right axilla.  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  The patient met with Dr. Donne Ford 03/14/2013 and he recommended primary systemic chemotherapy to decrease the size of the tumor and increase the chance of lumpectomy.   INTERVAL HISTORY: Sydney Ford returns alone today for followup of her locally advanced right breast carcinoma. She is currently day 7 cycle 1 of 4 planned dose dense cycles of doxorubicin/cyclophosphamide being given in the neoadjuvant setting.. She receives Neulasta on day 2 for granulocyte support.  Overall, Sydney Ford seems to have tolerated treatment well last week. Her biggest complaint was a headache which lasted approximately 3-4 days. She was  taking ondansetron, and it is likely that that contributed to the headache. She had no problems with nausea whatsoever. She may try dropping the ondansetron with cycle 2. Fortunately, at this point, the headache has resolved. She's had no dizziness or change in vision. She does have some "twitching" of the left eyelid which is new.  Otherwise, Sydney Ford's only other complaint today is a feeling of "disconnect". She tells me she "doesn't feel happy, doesn't feel sad, but doesn't feel anything". She does have a history of some depression and anxiety and is on a low dose of Zoloft, 50 mg daily. These feelings of apathy seem to have started last week with chemotherapy. She denies any suicidal ideations whatsoever.   REVIEW OF SYSTEMS: Sydney Ford has had no recent illnesses and denies any fevers or chills. She has had no rashes, abnormal bruising, or bleeding. She's had no mouth ulcers or oral sensitivity. She's eating and drinking well. She's had no change in bowel or bladder habits. She continues to have some shortness of breath with exertion which is stable. She's had no increased cough, orthopnea, peripheral swelling, chest pain, or palpitations. She has some chronic arthritis with associated joint pain, in addition to chronic lower back pain. Both of these seem to have worsened over the past week, especially since having the Neulasta injection. She's currently taking tramadol, one tablet 3 times daily, it is not sure that that is going to be adenopathy. She did just refill a prescription, however, from Dr. Wilson Ford.  She does not tolerate hydrocodone or oxycodone.   A detailed review of systems today was otherwise noncontributory  PAST MEDICAL HISTORY: Past Medical History  Diagnosis Date  . Hypertension   .  Hyperlipidemia   . Thyroid disease   . Osteoporosis   . Hypothyroidism     hashimoto's  . Depression   . Pneumonia     2014  . Cold (disease) 03/21/12    couple of weeks ago - pt states much better now  .  History of kidney stones     "passed 9 stones"  . GERD (gastroesophageal reflux disease)     no meds except occas tums  . Headache(784.0)     occas migraine  . Cancer     new dx of right breast cancer - planning chemo first  . Arthritis     oa; lower back pain - hx of injury yrs ago-occas flare ups  . Anemia     2005 - related to heavy menses with the thyroid probl    PAST SURGICAL HISTORY: Past Surgical History  Procedure Laterality Date  . Cesarean section  1987  . Tonsillectomy    . Uterine ablation  2008    no menstrual periods since procedure  . Portacath placement N/A 03/23/2013    Procedure: INSERTION PORT-A-CATH;  Surgeon: Sydney Bookbinder, MD;  Location: [facility];  Service: General;  Laterality: N/A;    FAMILY HISTORY Family History  Problem Relation Age of Onset  . Heart failure Mother   . Heart attack Father    the patient's father died at the age of 67 from a myocardial infarction. The patient's mother died at the age of 68 with congestive heart failure. She also had a history of lupus. The patient had no brothers or sisters. There is no history of breast or ovarian cancer in the family to her knowledge.  GYNECOLOGIC HISTORY:  Menarche age 57, first live birth age 82. She is GX P2. She underwent endometrial ablation in 2009 and has not had a period since that time. She never took hormone replacement. She did use birth control pills remotely with no complications  SOCIAL HISTORY: (Updated January 2015) Sydney Ford works at [employer] for a variety of long-term clients. She does not work for a company. Her husband Sydney Ford "Sydney Ford" Sydney Ford is semiretired. He works for a company that [employer] data on [place]. Son Sydney Ford is in [place] and works in Architect. Son Sydney Ford lives at home and is disabled with a diagnosis of paranoid schizophrenia. The patient has no grandchildren. She is not a church attender    ADVANCED DIRECTIVES: Not in place   HEALTH MAINTENANCE:  (Updated January 2015) History  Substance Use Topics  . Smoking status: Former Smoker    Quit date: 03/17/1985  . Smokeless tobacco: Never Used  . Alcohol Use: No     Colonoscopy: Never  PAP: Remote  Bone density: November 2014, showed osteoporosis   according to the patient's report  Lipid panel:  Not on file, Dr. Wilson Ford   Allergies  Allergen Reactions  . Codeine Itching and Nausea Only    REACTION: Nausea and itching  . Other     Close contact with Some recycled plastics cause whelps    Current Outpatient Prescriptions  Medication Sig Dispense Refill  . atorvastatin (LIPITOR) 20 MG tablet Take 20 mg by mouth every evening.       . Cholecalciferol (VITAMIN D) 2000 UNITS CAPS Take 6,000 Units by mouth every morning.      . lidocaine-prilocaine (EMLA) cream Apply 1 application topically as needed.  30 g  0  . lisinopril (PRINIVIL,ZESTRIL) 20 MG tablet Take 20 mg by mouth every  morning.       Marland Kitchen LORazepam (ATIVAN) 0.5 MG tablet Take 0.5 mg by mouth every 8 (eight) hours as needed for anxiety (or nausea).       . meloxicam (MOBIC) 15 MG tablet Take 15 mg by mouth every morning.       Marland Kitchen omeprazole (PRILOSEC) 40 MG capsule Take 1 capsule (40 mg total) by mouth daily.  30 capsule  4  . sertraline (ZOLOFT) 100 MG tablet Take 1 tablet (100 mg total) by mouth at bedtime.  30 tablet  1  . thyroid (ARMOUR) 90 MG tablet Take 90 mg by mouth every morning.      . traMADol (ULTRAM) 50 MG tablet Take 50 mg by mouth 3 (three) times daily as needed for moderate pain or severe pain.       . ciprofloxacin (CIPRO) 500 MG tablet Take 1 tablet (500 mg total) by mouth 2 (two) times daily.  14 tablet  3  . dexamethasone (DECADRON) 4 MG tablet Take 2 tablets by mouth once a day on the day after chemotherapy and then take 2 tablets two times a day for 2 days. Take with food.  30 tablet  1  . ondansetron (ZOFRAN) 8 MG tablet Take 1 tablet (8 mg total) by mouth 2 (two) times daily as needed. Take two times a  day as needed for nausea or vomiting starting on the third day after chemotherapy.  30 tablet  1  . prochlorperazine (COMPAZINE) 10 MG tablet Take 1 tablet (10 mg total) by mouth every 6 (six) hours as needed (Nausea or vomiting).  30 tablet  1   No current facility-administered medications for this visit.    OBJECTIVE: Middle-aged white woman who appears comfortable and is in no acute distress Filed Vitals:   04/06/13 1453  BP: 142/74  Pulse: 73  Temp: 98.8 F (37.1 C)  Resp: 18     Body mass index is 41.65 kg/(m^2).    ECOG FS: 1 Filed Weights   04/06/13 1453  Weight: 227 lb 12.8 oz (103.329 kg)   Physical Exam: HEENT:  Sclerae anicteric.  Oropharynx clear and moist. No ulcerations. No evidence of oropharyngeal candidiasis. Neck is supple. Trachea midline. No thyromegaly. NODES:  No cervical or supraclavicular lymphadenopathy palpated.  BREAST EXAM:  Deferred. No axillary lymphadenopathy on either the right or the left. LUNGS:  Clear to auscultation bilaterally with good excursion.  No wheezes or rhonchi HEART:  Regular rate and rhythm. No murmur  ABDOMEN:  Soft, obese, nontender.  Positive bowel sounds.  MSK:  No focal spinal tenderness to palpation. Good range of motion bilaterally in the upper extremities. EXTREMITIES:  No peripheral edema.   SKIN:  Benign with no notable skin lesions or rashes. Good skin turgor.  Port is intact in the left upper chest wall with no erythema, edema, or evidence of infection/cellulitis. NEURO:  Nonfocal. Well oriented.  Appropriate affect.     LAB RESULTS:  Lab Results  Component Value Date   WBC 1.5* 04/06/2013   NEUTROABS 0.4* 04/06/2013   HGB 13.9 04/06/2013   HCT 40.7 04/06/2013   MCV 87.3 04/06/2013   PLT 101* 04/06/2013      Chemistry      Component Value Date/Time   NA 140 03/31/2013 0918   NA 141 08/16/2007 0833   K 4.4 03/31/2013 0918   K 4.5 08/16/2007 0833   CL 105 08/16/2007 0833   CO2 23 03/31/2013 0918   CO2 27 08/16/2007  0833    BUN 21.8 03/31/2013 0918   BUN 14 08/16/2007 0833   CREATININE 1.0 03/31/2013 0918   CREATININE 0.9 08/16/2007 0833      Component Value Date/Time   CALCIUM 10.0 03/31/2013 0918   CALCIUM 9.7 08/16/2007 0833   ALKPHOS 85 03/31/2013 0918   ALKPHOS 64 08/16/2007 0833   AST 16 03/31/2013 0918   AST 25 08/16/2007 0833   ALT 23 03/31/2013 0918   ALT 30 08/16/2007 0833   BILITOT 0.79 03/31/2013 0918   BILITOT 1.0 08/16/2007 0833       STUDIES:   Mr Breast Bilateral W Wo Contrast  03/08/2013   CLINICAL DATA:  Recent diagnosis of invasive mammary carcinoma following ultrasound-guided biopsy of a right breast mass at 11 o'clock.  EXAM: BILATERAL BREAST MRI WITH AND WITHOUT CONTRAST  LABS:  BUN and creatinine were obtained on site at [facility] at  [address]  Results:  BUN 15 mg/dL,  Creatinine 0.9 mg/dL.  TECHNIQUE: Multiplanar, multisequence MR images of both breasts were obtained prior to and following the intravenous administration of 44m of MultiHance.  THREE-DIMENSIONAL MR IMAGE RENDERING ON INDEPENDENT WORKSTATION:  Three-dimensional MR images were rendered by post-processing of the original MR data on an independent workstation. The three-dimensional MR images were interpreted, and findings are reported in the following complete MRI report for this study. Three dimensional images were evaluated at the independent DynaCad workstation  COMPARISON:  Previous exams  FINDINGS: Breast composition: c:  Heterogeneous fibroglandular tissue  Background parenchymal enhancement: Mild  Right breast: A 3 x 2 x 1.7 cm irregular enhancing mass is identified in the upper-outer right breast, consistent with the biopsy-proven invasive mammary carcinoma. The mass is associated with mixed kinetics, which are predominantly washout and plateau. Susceptibility artifact is identified within the mass, consistent with the biopsy site marker. No additional areas of abnormal enhancement.  Left breast: No mass or  abnormal enhancement.  Lymph nodes: No abnormal appearing lymph nodes.  Ancillary findings: A diffusely enlarged thyroid gland is identified. This was noted on a previous thyroid ultrasound in 2009. Additionally, there is a T2 hyperintense lesion in close approximation to the thyroid isthmus. This measures 1.6 x 1.4 x 0.6 cm.  IMPRESSION: 1. A 3 x 2 x 1.7 cm irregularly enhancing mass in the upper-outer right breast, consistent with the biopsy-proven invasive mammary carcinoma. 2. T2 hyperintense lesion in close approximation to the thyroid isthmus. This measures up to 1.6 cm. The thyroid is diffusely enlarged, as seen on a prior thyroid ultrasound.  RECOMMENDATION: Treatment planning for biopsy-proven right breast carcinoma. Thyroid ultrasound is recommended for T2 hyperintense lesion in close approximation to the thyroid isthmus.  BI-RADS CATEGORY  6: Known biopsy-proven malignancy - appropriate action should be taken.   Electronically Signed   By: MDonavan BurnetM.D.   On: 03/08/2013 15:29     ASSESSMENT: 58y.o. 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  (2)  being treated in the neoadjuvant setting, the plan being to complete 4 dose dense cycles of doxorubicin/cyclophosphamide, first dose on 03/31/2013. She will be given Neulasta on day 2 for granulocyte support. This regimen will be followed by 12 weekly doses of carboplatin/paclitaxel prior to definitive surgery.   PLAN: Sydney Ford seems to have tolerated her first cycle of neoadjuvant chemotherapy well. The only change I'm  making in her chemotherapy regimen is the fact that we will discontinue her oral ondansetron at home.   We reviewed neutropenic precautions today. She is being started on Cipro prophylactically, 500 mg by mouth twice a day for 7 days. She understands to take special precautions to avoid  infection, and also to contact us immediately with any fevers of 100 or above.  She was given all of this information in writing today.  We also discussed the possibility of increasing her Zoloft which I think will help somewhat with the apathy. I think it is likely that this is associated with some depression, likely even some PTSD associated with her recent diagnosis. I have asked her to alternate between 50 mg and 100 mg for the next week, then switch to 100 mg every day. She was given these instructions in writing. I will continue to follow her very closely.  Again, as noted above, she denies any suicidal ideation.  She is scheduled to see me again next week on January 28, in anticipation of day 1 cycle 2 of therapy on January 29. Patient voices understanding and agreement with the plan as detailed above, and knows to call us with any changes or problems prior to her next scheduled appointment.    Kimya Mccahill, PA-C   04/06/2013 3:59 PM

## 2013-04-08 ENCOUNTER — Encounter: Payer: Self-pay | Admitting: Oncology

## 2013-04-08 NOTE — Progress Notes (Signed)
Called home and no answer.I called the cell and left a message for the patient to call me back to advise what is the income for her hubby located on the bank statement.

## 2013-04-13 ENCOUNTER — Other Ambulatory Visit (HOSPITAL_BASED_OUTPATIENT_CLINIC_OR_DEPARTMENT_OTHER): Payer: BC Managed Care – PPO

## 2013-04-13 ENCOUNTER — Encounter: Payer: Self-pay | Admitting: Physician Assistant

## 2013-04-13 ENCOUNTER — Telehealth: Payer: Self-pay | Admitting: *Deleted

## 2013-04-13 ENCOUNTER — Ambulatory Visit (HOSPITAL_BASED_OUTPATIENT_CLINIC_OR_DEPARTMENT_OTHER): Payer: BC Managed Care – PPO | Admitting: Physician Assistant

## 2013-04-13 VITALS — BP 137/84 | HR 69 | Temp 98.2°F | Resp 18 | Ht 62.0 in | Wt 227.1 lb

## 2013-04-13 DIAGNOSIS — C50411 Malignant neoplasm of upper-outer quadrant of right female breast: Secondary | ICD-10-CM

## 2013-04-13 DIAGNOSIS — C50419 Malignant neoplasm of upper-outer quadrant of unspecified female breast: Secondary | ICD-10-CM

## 2013-04-13 DIAGNOSIS — G8929 Other chronic pain: Secondary | ICD-10-CM

## 2013-04-13 DIAGNOSIS — Z171 Estrogen receptor negative status [ER-]: Secondary | ICD-10-CM

## 2013-04-13 DIAGNOSIS — M1990 Unspecified osteoarthritis, unspecified site: Secondary | ICD-10-CM

## 2013-04-13 DIAGNOSIS — M2550 Pain in unspecified joint: Secondary | ICD-10-CM

## 2013-04-13 DIAGNOSIS — M549 Dorsalgia, unspecified: Secondary | ICD-10-CM

## 2013-04-13 LAB — COMPREHENSIVE METABOLIC PANEL (CC13)
ALT: 24 U/L (ref 0–55)
AST: 16 U/L (ref 5–34)
Albumin: 3.7 g/dL (ref 3.5–5.0)
Alkaline Phosphatase: 83 U/L (ref 40–150)
Anion Gap: 10 mEq/L (ref 3–11)
BILIRUBIN TOTAL: 0.27 mg/dL (ref 0.20–1.20)
BUN: 16.4 mg/dL (ref 7.0–26.0)
CO2: 23 mEq/L (ref 22–29)
CREATININE: 1.1 mg/dL (ref 0.6–1.1)
Calcium: 9.2 mg/dL (ref 8.4–10.4)
Chloride: 109 mEq/L (ref 98–109)
Glucose: 95 mg/dl (ref 70–140)
Potassium: 4.4 mEq/L (ref 3.5–5.1)
SODIUM: 142 meq/L (ref 136–145)
Total Protein: 6.4 g/dL (ref 6.4–8.3)

## 2013-04-13 LAB — CBC WITH DIFFERENTIAL/PLATELET
BASO%: 0.5 % (ref 0.0–2.0)
Basophils Absolute: 0 10*3/uL (ref 0.0–0.1)
EOS%: 0.5 % (ref 0.0–7.0)
Eosinophils Absolute: 0 10*3/uL (ref 0.0–0.5)
HEMATOCRIT: 39.7 % (ref 34.8–46.6)
HGB: 13.4 g/dL (ref 11.6–15.9)
LYMPH%: 21.9 % (ref 14.0–49.7)
MCH: 29.8 pg (ref 25.1–34.0)
MCHC: 33.8 g/dL (ref 31.5–36.0)
MCV: 88.3 fL (ref 79.5–101.0)
MONO#: 0.4 10*3/uL (ref 0.1–0.9)
MONO%: 6.1 % (ref 0.0–14.0)
NEUT#: 4.7 10*3/uL (ref 1.5–6.5)
NEUT%: 71 % (ref 38.4–76.8)
PLATELETS: 194 10*3/uL (ref 145–400)
RBC: 4.5 10*6/uL (ref 3.70–5.45)
RDW: 12.6 % (ref 11.2–14.5)
WBC: 6.6 10*3/uL (ref 3.9–10.3)
lymph#: 1.4 10*3/uL (ref 0.9–3.3)

## 2013-04-13 MED ORDER — TRAMADOL HCL 50 MG PO TABS: 50.0000 mg | ORAL_TABLET | Freq: Three times a day (TID) | ORAL | Status: DC | PRN

## 2013-04-13 NOTE — Progress Notes (Signed)
[place]  [phone] Fax:(336) [phone]     ID: [name] OB: 1955-10-07  MR#: [id number]  YBW#:[id number]  PCP: Dwan Bolt, MD GYN:   SU: Rolm Bookbinder, MD OTHER MD: Ulyess Blossom, MD  CHIEF COMPLAINT:  Right breast cancer/neoadjuvant chemotherapy   HISTORY OF PRESENT ILLNESS: Sydney Ford (pronounced "line 'em") herself noted a change in her right breast sometime October 2014 but "my breasts are like bean bags" and she initially ignored it. After while she began to feel a pole when she lay on her right side at night so she scheduled herself for screening mammography at [facility] 02/08/2013 (prior mammogram had been April 2011), and this did show a possible mass in the right breast. Unilateral right diagnostic mammography and ultrasonography 02/28/2013 confirmed an irregular mass in the right breast upper outer quadrant measuring 2.4 cm. This was palpable at the 11:00 position. Ultrasound showed an irregular hypoechoic mass measuring 1.5 cm with no abnormal adenopathy in the right axilla.  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  The patient met with Dr. Donne Hazel 03/14/2013 and he recommended primary systemic chemotherapy to decrease the size of the tumor and increase the chance of lumpectomy.   INTERVAL HISTORY: Sydney Ford returns alone today for followup of her locally advanced right breast carcinoma. She scheduled for her next chemotherapy infusion tomorrow, January 29. She is due for day 1 cycle 2 of 4 planned dose dense cycles of doxorubicin/cyclophosphamide being given in the neoadjuvant setting. She receives Neulasta on day 2 for granulocyte support.  Sydney Ford is feeling well today with few complaints. She does continue to have joint  pain and lower back pain. These are both chronic, and she typically takes tramadol for the pain. The pain has worsened, however, since having the Neulasta injection, and she requests a refill on the tramadol today.   Otherwise, her headache has completely resolved and we do think it was in fact associated with the ondansetron. She's had no dizziness or change in vision. She also increased her dose of Zoloft from 50-100 mg daily as previously discussed and is tolerating that change well. She does seem a little more upbeat at her visit today.   REVIEW OF SYSTEMS: Sydney Ford has had no recent illnesses and denies any fevers of 100 or above. She has had no rashes, skin changes abnormal bruising, or bleeding. She had 2 or 3 mouth ulcers earlier in the week, but these have resolved.  She's eating and drinking well. She has some chronic, nagging queasiness which is stable. She's had no emesis. She's had no change in bowel or bladder habits. She continues to have some shortness of breath with exertion which is stable. She's had no increased cough, orthopnea, peripheral swelling, chest pain, or palpitations.   A detailed review of systems today was otherwise noncontributory  PAST MEDICAL HISTORY: Past Medical History  Diagnosis Date  . Hypertension   . Hyperlipidemia   . Thyroid disease   . Osteoporosis   . Hypothyroidism     hashimoto's  . Depression   . Pneumonia     2014  . Cold (disease) 03/21/12    couple of weeks ago - pt states much better now  . History of kidney stones     "passed 9 stones"  . GERD (gastroesophageal reflux  disease)     no meds except occas tums  . Headache(784.0)     occas migraine  . Cancer     new dx of right breast cancer - planning chemo first  . Arthritis     oa; lower back pain - hx of injury yrs ago-occas flare ups  . Anemia     2005 - related to heavy menses with the thyroid probl    PAST SURGICAL HISTORY: Past Surgical History  Procedure Laterality Date  .  Cesarean section  1987  . Tonsillectomy    . Uterine ablation  2008    no menstrual periods since procedure  . Portacath placement N/A 03/23/2013    Procedure: INSERTION PORT-A-CATH;  Surgeon: Rolm Bookbinder, MD;  Location: [facility];  Service: General;  Laterality: N/A;    FAMILY HISTORY Family History  Problem Relation Age of Onset  . Heart failure Mother   . Heart attack Father    the patient's father died at the age of 55 from a myocardial infarction. The patient's mother died at the age of 69 with congestive heart failure. She also had a history of lupus. The patient had no brothers or sisters. There is no history of breast or ovarian cancer in the family to her knowledge.  GYNECOLOGIC HISTORY:  Menarche age 72, first live birth age 40. She is GX P2. She underwent endometrial ablation in 2009 and has not had a period since that time. She never took hormone replacement. She did use birth control pills remotely with no complications  SOCIAL HISTORY: (Updated January 2015) Sydney Ford works at [employer] for a variety of long-term clients. She does not work for a company. Her husband [name] is semiretired. He works for a company that [employer] data on [place]. Son [name] is in [employer] and works in Architect. Son [name] lives at home and is disabled with a diagnosis of paranoid schizophrenia. The patient has no grandchildren. She is not a church attender    ADVANCED DIRECTIVES: Not in place   HEALTH MAINTENANCE: (Updated January 2015) History  Substance Use Topics  . Smoking status: Former Smoker    Quit date: 03/17/1985  . Smokeless tobacco: Never Used  . Alcohol Use: No     Colonoscopy: Never  PAP: Remote  Bone density: November 2014, showed osteoporosis   according to the patient's report  Lipid panel:  Not on file, Dr. Wilson Singer   Allergies  Allergen Reactions  . Codeine Itching and Nausea Only    REACTION: Nausea and itching  . Other     Close  contact with Some recycled plastics cause whelps    Current Outpatient Prescriptions  Medication Sig Dispense Refill  . atorvastatin (LIPITOR) 20 MG tablet Take 20 mg by mouth every evening.       . Cholecalciferol (VITAMIN D) 2000 UNITS CAPS Take 6,000 Units by mouth every morning.      . lidocaine-prilocaine (EMLA) cream Apply 1 application topically as needed.  30 g  0  . lisinopril (PRINIVIL,ZESTRIL) 20 MG tablet Take 20 mg by mouth every morning.       Marland Kitchen LORazepam (ATIVAN) 0.5 MG tablet Take 0.5 mg by mouth every 8 (eight) hours as needed for anxiety (or nausea).       . meloxicam (MOBIC) 15 MG tablet Take 15 mg by mouth every morning.       Marland Kitchen omeprazole (PRILOSEC) 40 MG capsule Take 1 capsule (40 mg total) by mouth  daily.  30 capsule  4  . sertraline (ZOLOFT) 100 MG tablet Take 1 tablet (100 mg total) by mouth at bedtime.  30 tablet  1  . thyroid (ARMOUR) 90 MG tablet Take 90 mg by mouth every morning.      . traMADol (ULTRAM) 50 MG tablet Take 1-2 tablets (50-100 mg total) by mouth 3 (three) times daily as needed for moderate pain or severe pain.  100 tablet  0  . dexamethasone (DECADRON) 4 MG tablet Take 2 tablets by mouth once a day on the day after chemotherapy and then take 2 tablets two times a day for 2 days. Take with food.  30 tablet  1  . ondansetron (ZOFRAN) 8 MG tablet Take 1 tablet (8 mg total) by mouth 2 (two) times daily as needed. Take two times a day as needed for nausea or vomiting starting on the third day after chemotherapy.  30 tablet  1  . prochlorperazine (COMPAZINE) 10 MG tablet Take 1 tablet (10 mg total) by mouth every 6 (six) hours as needed (Nausea or vomiting).  30 tablet  1   No current facility-administered medications for this visit.    OBJECTIVE: Middle-aged white woman  in no acute distress Filed Vitals:   04/13/13 1131  BP: 137/84  Pulse: 69  Temp: 98.2 F (36.8 C)  Resp: 18     Body mass index is 41.53 kg/(m^2).    ECOG FS: 1 Filed Weights    04/13/13 1131  Weight: 227 lb 1.6 oz (103.012 kg)   Physical Exam: HEENT:  Sclerae anicteric.  Oropharynx clear and moist. No ulcerations or oropharyngeal candidiasis. Neck is supple. Trachea midline. No thyromegaly. NODES:  No cervical or supraclavicular lymphadenopathy palpated.  BREAST EXAM: He is a palpable mass in the upper-outer quadrant of the right breast, freely movable, approximately 1-1.5 cm in diameter today. There no obvious skin changes. Left breast is unremarkable.  No axillary lymphadenopathy on either the right or the left. LUNGS:  Clear to auscultation bilaterally with good excursion.  No wheezes or rhonchi HEART:  Regular rate and rhythm. No murmur appreciated ABDOMEN:  Soft, obese, nontender.  Positive bowel sounds.  MSK:  No focal spinal tenderness to palpation. Good range of motion bilaterally in the upper extremities. EXTREMITIES:  No peripheral edema.   SKIN:  Benign with no skin lesions or rashes. No nail dyscrasia. No pallor. Port is intact in the left upper chest wall with no erythema, edema, or evidence of infection/cellulitis. NEURO:  Nonfocal. Well oriented.  Appropriate affect.     LAB RESULTS:  Lab Results  Component Value Date   WBC 6.6 04/13/2013   NEUTROABS 4.7 04/13/2013   HGB 13.4 04/13/2013   HCT 39.7 04/13/2013   MCV 88.3 04/13/2013   PLT 194 04/13/2013      Chemistry      Component Value Date/Time   NA 142 04/13/2013 1119   NA 141 08/16/2007 0833   K 4.4 04/13/2013 1119   K 4.5 08/16/2007 0833   CL 105 08/16/2007 0833   CO2 23 04/13/2013 1119   CO2 27 08/16/2007 0833   BUN 16.4 04/13/2013 1119   BUN 14 08/16/2007 0833   CREATININE 1.1 04/13/2013 1119   CREATININE 0.9 08/16/2007 0833      Component Value Date/Time   CALCIUM 9.2 04/13/2013 1119   CALCIUM 9.7 08/16/2007 0833   ALKPHOS 83 04/13/2013 1119   ALKPHOS 64 08/16/2007 0833   AST 16 04/13/2013 1119  AST 25 08/16/2007 0833   ALT 24 04/13/2013 1119   ALT 30 08/16/2007 0833   BILITOT 0.27 04/13/2013 1119    BILITOT 1.0 08/16/2007 3295       STUDIES:   Mr Breast Bilateral W Wo Contrast  03/08/2013   CLINICAL DATA:  Recent diagnosis of invasive mammary carcinoma following ultrasound-guided biopsy of a right breast mass at 11 o'clock.  EXAM: BILATERAL BREAST MRI WITH AND WITHOUT CONTRAST  LABS:  BUN and creatinine were obtained on site at [facility] at  [address]  Results:  BUN 15 mg/dL,  Creatinine 0.9 mg/dL.  TECHNIQUE: Multiplanar, multisequence MR images of both breasts were obtained prior to and following the intravenous administration of 27ml of MultiHance.  THREE-DIMENSIONAL MR IMAGE RENDERING ON INDEPENDENT WORKSTATION:  Three-dimensional MR images were rendered by post-processing of the original MR data on an independent workstation. The three-dimensional MR images were interpreted, and findings are reported in the following complete MRI report for this study. Three dimensional images were evaluated at the independent DynaCad workstation  COMPARISON:  Previous exams  FINDINGS: Breast composition: c:  Heterogeneous fibroglandular tissue  Background parenchymal enhancement: Mild  Right breast: A 3 x 2 x 1.7 cm irregular enhancing mass is identified in the upper-outer right breast, consistent with the biopsy-proven invasive mammary carcinoma. The mass is associated with mixed kinetics, which are predominantly washout and plateau. Susceptibility artifact is identified within the mass, consistent with the biopsy site marker. No additional areas of abnormal enhancement.  Left breast: No mass or abnormal enhancement.  Lymph nodes: No abnormal appearing lymph nodes.  Ancillary findings: A diffusely enlarged thyroid gland is identified. This was noted on a previous thyroid ultrasound in 2009. Additionally, there is a T2 hyperintense lesion in close approximation to the thyroid isthmus. This measures 1.6 x 1.4 x 0.6 cm.  IMPRESSION: 1. A 3 x 2 x 1.7 cm irregularly enhancing mass in the  upper-outer right breast, consistent with the biopsy-proven invasive mammary carcinoma. 2. T2 hyperintense lesion in close approximation to the thyroid isthmus. This measures up to 1.6 cm. The thyroid is diffusely enlarged, as seen on a prior thyroid ultrasound.  RECOMMENDATION: Treatment planning for biopsy-proven right breast carcinoma. Thyroid ultrasound is recommended for T2 hyperintense lesion in close approximation to the thyroid isthmus.  BI-RADS CATEGORY  6: Known biopsy-proven malignancy - appropriate action should be taken.   Electronically Signed   By: Donavan Burnet M.D.   On: 03/08/2013 15:29     ASSESSMENT: 58 y.o. 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  (2)  being treated in the neoadjuvant setting, the plan being to complete 4 dose dense cycles of doxorubicin/cyclophosphamide, first dose on 03/31/2013. She will be given Neulasta on day 2 for granulocyte support. This regimen will be followed by 12 weekly doses of carboplatin/paclitaxel prior to definitive surgery.   PLAN: Sydney Ford will return tomorrow, January 29, for day 1 cycle 2 of neoadjuvant doxorubicin/cyclophosphamide. She will receive Neulasta on day 2, January 30. I will see her next week for assessment of chemotoxicity on February 5.  As noted above, we are going to try holding the oral ondansetron following this cycle, but she will have it on hand at home if she needs it for nausea. I do think this is what was causing the headaches, and she might need  to take 25 mg Benadryl if she needs the ondansetron.  I have refilled her tramadol as discussed above, 50 mg, 1-2 tablets up to 3 times daily as needed for pain.  Otherwise, I am making no changes to Sydney Ford's regimen today. All of the above was reviewed with her today, and she voices understanding and agreement with this plan. She will  call with any changes prior to her next appointment.   Micah Flesher, PA-C   04/13/2013 12:48 PM

## 2013-04-13 NOTE — Telephone Encounter (Signed)
Appts made and printed...td 

## 2013-04-14 ENCOUNTER — Ambulatory Visit (HOSPITAL_BASED_OUTPATIENT_CLINIC_OR_DEPARTMENT_OTHER): Payer: BC Managed Care – PPO

## 2013-04-14 ENCOUNTER — Other Ambulatory Visit: Payer: BC Managed Care – PPO

## 2013-04-14 ENCOUNTER — Other Ambulatory Visit: Payer: Self-pay | Admitting: *Deleted

## 2013-04-14 VITALS — BP 144/75 | HR 75 | Temp 98.7°F | Resp 18

## 2013-04-14 DIAGNOSIS — Z5111 Encounter for antineoplastic chemotherapy: Secondary | ICD-10-CM

## 2013-04-14 DIAGNOSIS — C50411 Malignant neoplasm of upper-outer quadrant of right female breast: Secondary | ICD-10-CM

## 2013-04-14 DIAGNOSIS — C50419 Malignant neoplasm of upper-outer quadrant of unspecified female breast: Secondary | ICD-10-CM

## 2013-04-14 MED ORDER — SODIUM CHLORIDE 0.9 % IV SOLN
Freq: Once | INTRAVENOUS | Status: AC
  Administered 2013-04-14: 12:00:00 via INTRAVENOUS

## 2013-04-14 MED ORDER — DEXAMETHASONE SODIUM PHOSPHATE 20 MG/5ML IJ SOLN
12.0000 mg | Freq: Once | INTRAMUSCULAR | Status: AC
  Administered 2013-04-14: 12 mg via INTRAVENOUS

## 2013-04-14 MED ORDER — DOXORUBICIN HCL CHEMO IV INJECTION 2 MG/ML
60.0000 mg/m2 | Freq: Once | INTRAVENOUS | Status: AC
  Administered 2013-04-14: 128 mg via INTRAVENOUS
  Filled 2013-04-14: qty 64

## 2013-04-14 MED ORDER — SODIUM CHLORIDE 0.9 % IV SOLN
150.0000 mg | Freq: Once | INTRAVENOUS | Status: AC
  Administered 2013-04-14: 150 mg via INTRAVENOUS
  Filled 2013-04-14: qty 5

## 2013-04-14 MED ORDER — HEPARIN SOD (PORK) LOCK FLUSH 100 UNIT/ML IV SOLN
500.0000 [IU] | Freq: Once | INTRAVENOUS | Status: AC | PRN
  Administered 2013-04-14: 500 [IU]
  Filled 2013-04-14: qty 5

## 2013-04-14 MED ORDER — DEXAMETHASONE SODIUM PHOSPHATE 20 MG/5ML IJ SOLN
INTRAMUSCULAR | Status: AC
  Filled 2013-04-14: qty 5

## 2013-04-14 MED ORDER — PALONOSETRON HCL INJECTION 0.25 MG/5ML
0.2500 mg | Freq: Once | INTRAVENOUS | Status: AC
  Administered 2013-04-14: 0.25 mg via INTRAVENOUS

## 2013-04-14 MED ORDER — CYCLOPHOSPHAMIDE CHEMO INJECTION 1 GM
600.0000 mg/m2 | Freq: Once | INTRAMUSCULAR | Status: AC
  Administered 2013-04-14: 1280 mg via INTRAVENOUS
  Filled 2013-04-14: qty 64

## 2013-04-14 MED ORDER — SODIUM CHLORIDE 0.9 % IJ SOLN
10.0000 mL | INTRAMUSCULAR | Status: DC | PRN
  Administered 2013-04-14: 10 mL
  Filled 2013-04-14: qty 10

## 2013-04-14 MED ORDER — LORAZEPAM 2 MG/ML IJ SOLN
INTRAMUSCULAR | Status: AC
  Filled 2013-04-14: qty 1

## 2013-04-14 MED ORDER — PALONOSETRON HCL INJECTION 0.25 MG/5ML
INTRAVENOUS | Status: AC
  Filled 2013-04-14: qty 5

## 2013-04-14 MED ORDER — LORAZEPAM 2 MG/ML IJ SOLN
1.0000 mg | Freq: Once | INTRAMUSCULAR | Status: AC
  Administered 2013-04-14: 12:00:00 via INTRAVENOUS

## 2013-04-14 NOTE — Patient Instructions (Signed)
[facility] Discharge Instructions for Patients Receiving Chemotherapy  Today you received the following chemotherapy agents Adriamycin/Cytoxan To help prevent nausea and vomiting after your treatment, we encourage you to take your nausea medication as prescribed. If you develop nausea and vomiting that is not controlled by your nausea medication, call the clinic.   BELOW ARE SYMPTOMS THAT SHOULD BE REPORTED IMMEDIATELY:  *FEVER GREATER THAN 100.5 F  *CHILLS WITH OR WITHOUT FEVER  NAUSEA AND VOMITING THAT IS NOT CONTROLLED WITH YOUR NAUSEA MEDICATION  *UNUSUAL SHORTNESS OF BREATH  *UNUSUAL BRUISING OR BLEEDING  TENDERNESS IN MOUTH AND THROAT WITH OR WITHOUT PRESENCE OF ULCERS  *URINARY PROBLEMS  *BOWEL PROBLEMS  UNUSUAL RASH Items with * indicate a potential emergency and should be followed up as soon as possible.  Feel free to call the clinic you have any questions or concerns. The clinic phone number is [phone].    

## 2013-04-14 NOTE — Progress Notes (Signed)
Brisk blood return noted before, during and at end of Adriamycin administration. 

## 2013-04-15 ENCOUNTER — Ambulatory Visit (HOSPITAL_BASED_OUTPATIENT_CLINIC_OR_DEPARTMENT_OTHER): Payer: BC Managed Care – PPO

## 2013-04-15 VITALS — BP 142/57 | HR 103 | Temp 98.1°F

## 2013-04-15 DIAGNOSIS — C50411 Malignant neoplasm of upper-outer quadrant of right female breast: Secondary | ICD-10-CM

## 2013-04-15 DIAGNOSIS — Z5189 Encounter for other specified aftercare: Secondary | ICD-10-CM

## 2013-04-15 DIAGNOSIS — C50419 Malignant neoplasm of upper-outer quadrant of unspecified female breast: Secondary | ICD-10-CM

## 2013-04-15 MED ORDER — PEGFILGRASTIM INJECTION 6 MG/0.6ML
6.0000 mg | Freq: Once | SUBCUTANEOUS | Status: AC
  Administered 2013-04-15: 6 mg via SUBCUTANEOUS
  Filled 2013-04-15: qty 0.6

## 2013-04-21 ENCOUNTER — Encounter: Payer: Self-pay | Admitting: Physician Assistant

## 2013-04-21 ENCOUNTER — Other Ambulatory Visit: Payer: Self-pay | Admitting: Physician Assistant

## 2013-04-21 ENCOUNTER — Other Ambulatory Visit (HOSPITAL_BASED_OUTPATIENT_CLINIC_OR_DEPARTMENT_OTHER): Payer: BC Managed Care – PPO

## 2013-04-21 ENCOUNTER — Encounter: Payer: Self-pay | Admitting: Oncology

## 2013-04-21 ENCOUNTER — Telehealth: Payer: Self-pay | Admitting: *Deleted

## 2013-04-21 ENCOUNTER — Ambulatory Visit (HOSPITAL_BASED_OUTPATIENT_CLINIC_OR_DEPARTMENT_OTHER): Payer: BC Managed Care – PPO | Admitting: Physician Assistant

## 2013-04-21 VITALS — BP 127/80 | HR 108 | Temp 98.3°F | Resp 18 | Ht 62.0 in | Wt 227.3 lb

## 2013-04-21 DIAGNOSIS — R5383 Other fatigue: Secondary | ICD-10-CM

## 2013-04-21 DIAGNOSIS — C50419 Malignant neoplasm of upper-outer quadrant of unspecified female breast: Secondary | ICD-10-CM

## 2013-04-21 DIAGNOSIS — C50411 Malignant neoplasm of upper-outer quadrant of right female breast: Secondary | ICD-10-CM

## 2013-04-21 DIAGNOSIS — R197 Diarrhea, unspecified: Secondary | ICD-10-CM

## 2013-04-21 DIAGNOSIS — R5381 Other malaise: Secondary | ICD-10-CM

## 2013-04-21 DIAGNOSIS — D701 Agranulocytosis secondary to cancer chemotherapy: Secondary | ICD-10-CM

## 2013-04-21 DIAGNOSIS — T451X5A Adverse effect of antineoplastic and immunosuppressive drugs, initial encounter: Secondary | ICD-10-CM

## 2013-04-21 DIAGNOSIS — D702 Other drug-induced agranulocytosis: Secondary | ICD-10-CM

## 2013-04-21 LAB — CLOSTRIDIUM DIFFICILE BY PCR: Toxigenic C. Difficile by PCR: NEGATIVE

## 2013-04-21 LAB — CBC WITH DIFFERENTIAL/PLATELET
BASO%: 1.7 % (ref 0.0–2.0)
BASOS ABS: 0 10*3/uL (ref 0.0–0.1)
EOS ABS: 0 10*3/uL (ref 0.0–0.5)
EOS%: 1.7 % (ref 0.0–7.0)
HCT: 38.7 % (ref 34.8–46.6)
HEMOGLOBIN: 13.4 g/dL (ref 11.6–15.9)
LYMPH#: 0.4 10*3/uL — AB (ref 0.9–3.3)
LYMPH%: 65.5 % — AB (ref 14.0–49.7)
MCH: 29.8 pg (ref 25.1–34.0)
MCHC: 34.6 g/dL (ref 31.5–36.0)
MCV: 86.2 fL (ref 79.5–101.0)
MONO#: 0.1 10*3/uL (ref 0.1–0.9)
MONO%: 15.5 % — ABNORMAL HIGH (ref 0.0–14.0)
NEUT%: 15.6 % — AB (ref 38.4–76.8)
NEUTROS ABS: 0.1 10*3/uL — AB (ref 1.5–6.5)
PLATELETS: 147 10*3/uL (ref 145–400)
RBC: 4.49 10*6/uL (ref 3.70–5.45)
RDW: 12.6 % (ref 11.2–14.5)
WBC: 0.6 10*3/uL — CL (ref 3.9–10.3)

## 2013-04-21 MED ORDER — CHOLESTYRAMINE 4 G PO PACK: 4.0000 g | PACK | Freq: Two times a day (BID) | ORAL | Status: DC | PRN

## 2013-04-21 NOTE — Telephone Encounter (Signed)
Called pt to inform her that she was neg for C-diff and the diarrhea was likely due to chemo. Advised her to take Questran or Imodium for the diarrhea but to call us at [phone], if it doesn't improve in 24 - 36 hrs.Message to be forwarded to [employer], PA-C.

## 2013-04-21 NOTE — Progress Notes (Signed)
Patient qualifies for 600.00 and 400.00 one time grant. I will have her sign form today.

## 2013-04-21 NOTE — Progress Notes (Signed)
Sydney Ford  [phone] [phone]     ID: Sydney Ford OB: [birth year]  MR#: [id number]  IDP#:[id number]  PCP: Dwan Bolt, MD GYN:   SU: Rolm Bookbinder, MD OTHER MD: Ulyess Blossom, MD  CHIEF COMPLAINT:  Right breast cancer/neoadjuvant chemotherapy   HISTORY OF PRESENT ILLNESS: Sydney Ford (pronounced "line 'em") herself noted a change in her right breast sometime October 2014 but "my breasts are like bean bags" and she initially ignored it. After while she began to feel a pole when she lay on her right side at night so she scheduled herself for screening mammography [facility] 02/08/2013 (prior mammogram had been April 2011), and this did show a possible mass in the right breast. Unilateral right diagnostic mammography and ultrasonography 02/28/2013 confirmed an irregular mass in the right breast upper outer quadrant measuring 2.4 cm. This was palpable at the 11:00 position. Ultrasound showed an irregular hypoechoic mass measuring 1.5 cm with no abnormal adenopathy in the right axilla.  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  The patient met with Dr. Donne Hazel 03/14/2013 and he recommended primary systemic chemotherapy to decrease the size of the tumor and increase the chance of lumpectomy.   INTERVAL HISTORY: Sydney Ford returns alone today for followup of her locally advanced right breast carcinoma. She is currently day 8 cycle 2 of 4 planned dose dense cycles of doxorubicin/cyclophosphamide being given in the neoadjuvant setting. She receives Neulasta on day 2 for granulocyte support.  Sydney Ford is extremely tired today. She feels weak. She denies any fevers or chills. She has had some significant diarrhea today, very watery, and  malodorous. She's noted no blood or mucus in the stool. She's had mild abdominal cramping. She has had some intermittent nausea, but no emesis. She is again neutropenic with an ANC of 0.1 today. She has a history of neutropenia with cycle one, and was on Cipro x7 days 2 weeks ago.  Following the treatment last week, Sydney Ford took prochlorperazine and dexamethasone as directed, but no ondansetron. As a result, her headaches have improved, and she has only a "dull ache" that she would rate a 1 on a scale of 1-10.  REVIEW OF SYSTEMS: Sydney Ford has had no rashes but does note that her skin is dry her than usual. She denies any abnormal bruising or abnormal bleeding.  She's had no mouth ulcers or oral sensitivity. Her appetite is reduced, but she is keeping herself well hydrated.  She's had no change in bladder habits, and denies any dysuria or hematuria.. She continues to have some shortness of breath with exertion which is stable. She's had no increased cough, orthopnea, peripheral swelling, chest pain, or palpitations. She denies any dizziness or change in vision. She currently denies any new or unusual myalgias, arthralgias, or bony pain. Her chronic joint pain and lower back pain are stable.   A detailed review of systems today was otherwise noncontributory  PAST MEDICAL HISTORY: Past Medical History  Diagnosis Date  . Hypertension   . Hyperlipidemia   . Thyroid disease   . Osteoporosis   . Hypothyroidism     hashimoto's  . Depression   . Pneumonia     2014  . Cold (disease) 03/21/12    couple of weeks ago - pt states much better now  .  History of kidney stones     "passed 9 stones"  . GERD (gastroesophageal reflux disease)     no meds except occas tums  . Headache(784.0)     occas migraine  . Cancer     new dx of right breast cancer - planning chemo first  . Arthritis     oa; lower back pain - hx of injury yrs ago-occas flare ups  . Anemia     2005 - related to heavy menses with the thyroid  probl    PAST SURGICAL HISTORY: Past Surgical History  Procedure Laterality Date  . Cesarean section  1987  . Tonsillectomy    . Uterine ablation  2008    no menstrual periods since procedure  . Portacath placement N/A 03/23/2013    Procedure: INSERTION PORT-A-CATH;  Surgeon: Rolm Bookbinder, MD;  Location: [facility];  Service: General;  Laterality: N/A;    FAMILY HISTORY Family History  Problem Relation Age of Onset  . Heart failure Mother   . Heart attack Father    the patient's father died at the age of 39 from a myocardial infarction. The patient's mother died at the age of 35 with congestive heart failure. She also had a history of lupus. The patient had no brothers or sisters. There is no history of breast or ovarian cancer in the family to her knowledge.  GYNECOLOGIC HISTORY:  Menarche age 26, first live birth age 55. She is GX P2. She underwent endometrial ablation in 2009 and has not had a period since that time. She never took hormone replacement. She did use birth control pills remotely with no complications  SOCIAL HISTORY: (Updated January 2015) Sydney Ford works at [employer] for a variety of long-term clients. She does not work for a company. Her husband [name] is semiretired. He works for a company that [employer] data on [place]. Son [name] is in [place] and works in Architect. Son [name] lives at home and is disabled with a diagnosis of paranoid schizophrenia. The patient has no grandchildren. She is not a church attender    ADVANCED DIRECTIVES: Not in place   HEALTH MAINTENANCE: (Updated January 2015) History  Substance Use Topics  . Smoking status: Former Smoker    Quit date: 03/17/1985  . Smokeless tobacco: Never Used  . Alcohol Use: No     Colonoscopy: Never  PAP: Remote  Bone density: November 2014, showed osteoporosis   according to the patient's report  Lipid panel:  Not on file, Dr. Wilson Singer   Allergies  Allergen Reactions   . Codeine Itching and Nausea Only    REACTION: Nausea and itching  . Other     Close contact with Some recycled plastics cause whelps    Current Outpatient Prescriptions  Medication Sig Dispense Refill  . atorvastatin (LIPITOR) 20 MG tablet Take 20 mg by mouth every evening.       . Cholecalciferol (VITAMIN D) 2000 UNITS CAPS Take 6,000 Units by mouth every morning.      . ciprofloxacin (CIPRO) 500 MG tablet       . dexamethasone (DECADRON) 4 MG tablet Take 2 tablets by mouth once a day on the day after chemotherapy and then take 2 tablets two times a day for 2 days. Take with food.  30 tablet  1  . lidocaine-prilocaine (EMLA) cream Apply 1 application topically as needed.  30 g  0  . lisinopril (PRINIVIL,ZESTRIL) 20 MG tablet Take 20 mg by mouth  every morning.       Marland Kitchen LORazepam (ATIVAN) 0.5 MG tablet Take 0.5 mg by mouth every 8 (eight) hours as needed for anxiety (or nausea).       . meloxicam (MOBIC) 15 MG tablet Take 15 mg by mouth every morning.       Marland Kitchen omeprazole (PRILOSEC) 40 MG capsule Take 1 capsule (40 mg total) by mouth daily.  30 capsule  4  . prochlorperazine (COMPAZINE) 10 MG tablet Take 1 tablet (10 mg total) by mouth every 6 (six) hours as needed (Nausea or vomiting).  30 tablet  1  . sertraline (ZOLOFT) 100 MG tablet Take 1 tablet (100 mg total) by mouth at bedtime.  30 tablet  1  . thyroid (ARMOUR) 90 MG tablet Take 90 mg by mouth every morning.      . traMADol (ULTRAM) 50 MG tablet Take 1-2 tablets (50-100 mg total) by mouth 3 (three) times daily as needed for moderate pain or severe pain.  100 tablet  0  . cholestyramine (QUESTRAN) 4 G packet Take 1 packet (4 g total) by mouth 2 (two) times daily as needed (diarrhea). Take with meals.  30 each  1  . ondansetron (ZOFRAN) 8 MG tablet Take 1 tablet (8 mg total) by mouth 2 (two) times daily as needed. Take two times a day as needed for nausea or vomiting starting on the third day after chemotherapy.  30 tablet  1   No  current facility-administered medications for this visit.    OBJECTIVE: Middle-aged white woman who appears tired but is in no acute distress Filed Vitals:   04/21/13 1056  BP: 127/80  Pulse: 108  Temp: 98.3 F (36.8 C)  Resp: 18     Body mass index is 41.56 kg/(m^2).    ECOG FS: 1 Filed Weights   04/21/13 1056  Weight: 227 lb 4.8 oz (103.103 kg)   Physical Exam: HEENT:  Sclerae anicteric.  Oropharynx clear and moist. No ulcerations.  No evidence of oropharyngeal candidiasis. Neck is supple. Trachea midline. No thyromegaly. NODES:  No cervical or supraclavicular lymphadenopathy palpated.  BREAST EXAM: Deferred.  No axillary lymphadenopathy on either the right or the left. LUNGS:  Clear to auscultation bilaterally with good excursion.  No wheezes, rales, or rhonchi HEART:  Regular rate and rhythm. No murmur appreciated ABDOMEN:  Soft, obese, nondistended, nontender.  Positive bowel sounds.  MSK:  No focal spinal tenderness to palpation. Good range of motion bilaterally in the upper extremities. EXTREMITIES:  No peripheral edema.   SKIN:  Skin is dry. Otherwise benign with no skin lesions or rashes. No nail dyscrasia. No pallor. Port is intact in the left upper chest wall with no erythema, edema, or evidence of infection/cellulitis. NEURO:  Nonfocal. Well oriented.  Fatigued affect.    LAB RESULTS:  Lab Results  Component Value Date   WBC 0.6* 04/21/2013   NEUTROABS 0.1* 04/21/2013   HGB 13.4 04/21/2013   HCT 38.7 04/21/2013   MCV 86.2 04/21/2013   PLT 147 04/21/2013      Chemistry      Component Value Date/Time   NA 142 04/13/2013 1119   NA 141 08/16/2007 0833   K 4.4 04/13/2013 1119   K 4.5 08/16/2007 0833   CL 105 08/16/2007 0833   CO2 23 04/13/2013 1119   CO2 27 08/16/2007 0833   BUN 16.4 04/13/2013 1119   BUN 14 08/16/2007 0833   CREATININE 1.1 04/13/2013 1119   CREATININE 0.9 08/16/2007  8563      Component Value Date/Time   CALCIUM 9.2 04/13/2013 1119   CALCIUM 9.7 08/16/2007 0833    ALKPHOS 83 04/13/2013 1119   ALKPHOS 64 08/16/2007 0833   AST 16 04/13/2013 1119   AST 25 08/16/2007 0833   ALT 24 04/13/2013 1119   ALT 30 08/16/2007 0833   BILITOT 0.27 04/13/2013 1119   BILITOT 1.0 08/16/2007 1497       STUDIES:   Mr Breast Bilateral W Wo Contrast  03/08/2013   CLINICAL DATA:  Recent diagnosis of invasive mammary carcinoma following ultrasound-guided biopsy of a right breast mass at 11 o'clock.  EXAM: BILATERAL BREAST MRI WITH AND WITHOUT CONTRAST  LABS:  BUN and creatinine were obtained on site at [place] at  [address]  Results:  BUN 15 mg/dL,  Creatinine 0.9 mg/dL.  TECHNIQUE: Multiplanar, multisequence MR images of both breasts were obtained prior to and following the intravenous administration of 81m of MultiHance.  THREE-DIMENSIONAL MR IMAGE RENDERING ON INDEPENDENT WORKSTATION:  Three-dimensional MR images were rendered by post-processing of the original MR data on an independent workstation. The three-dimensional MR images were interpreted, and findings are reported in the following complete MRI report for this study. Three dimensional images were evaluated at the independent DynaCad workstation  COMPARISON:  Previous exams  FINDINGS: Breast composition: c:  Heterogeneous fibroglandular tissue  Background parenchymal enhancement: Mild  Right breast: A 3 x 2 x 1.7 cm irregular enhancing mass is identified in the upper-outer right breast, consistent with the biopsy-proven invasive mammary carcinoma. The mass is associated with mixed kinetics, which are predominantly washout and plateau. Susceptibility artifact is identified within the mass, consistent with the biopsy site marker. No additional areas of abnormal enhancement.  Left breast: No mass or abnormal enhancement.  Lymph nodes: No abnormal appearing lymph nodes.  Ancillary findings: A diffusely enlarged thyroid gland is identified. This was noted on a previous thyroid ultrasound in 2009. Additionally, there  is a T2 hyperintense lesion in close approximation to the thyroid isthmus. This measures 1.6 x 1.4 x 0.6 cm.  IMPRESSION: 1. A 3 x 2 x 1.7 cm irregularly enhancing mass in the upper-outer right breast, consistent with the biopsy-proven invasive mammary carcinoma. 2. T2 hyperintense lesion in close approximation to the thyroid isthmus. This measures up to 1.6 cm. The thyroid is diffusely enlarged, as seen on a prior thyroid ultrasound.  RECOMMENDATION: Treatment planning for biopsy-proven right breast carcinoma. Thyroid ultrasound is recommended for T2 hyperintense lesion in close approximation to the thyroid isthmus.  BI-RADS CATEGORY  6: Known biopsy-proven malignancy - appropriate action should be taken.   Electronically Signed   By: MDonavan BurnetM.D.   On: 03/08/2013 15:29     ASSESSMENT: 58y.o. 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  (2)  being treated in the neoadjuvant setting, the plan being to complete 4 dose dense cycles of doxorubicin/cyclophosphamide, first dose on 03/31/2013. She will be given Neulasta on day 2 for granulocyte support. This regimen will be followed by 12 weekly doses of carboplatin/paclitaxel prior to definitive surgery.   (3)  afebrile chemotherapy-induced neutropenia  (4)  diarrhea  PLAN: We again reviewed neutropenic precautions, and Sydney Ford is being started back on Cipro prophylactically, 500 mg by mouth twice a day for 7 days. She will watch her temperature closely  and will contact us with any temps of 100 or above.  With her history of antibiotic use and neutropenia, I am also going to check a stool specimen for C. difficile today. In the meanwhile, I am prescribing Questran to be used up to twice daily for the diarrhea.   Sydney Ford will return next week on February 12 in anticipation of her third cycle of  doxorubicin/cyclophosphamide. She is already scheduled for her Neulasta injection on day 2, and a followup appointment on February 18 for assessment of chemotoxicity.  We will plan on repeating a breast MRI in late February after completion of 4 cycles of neoadjuvant doxorubicin/cyclophosphamide.   All of the above was reviewed with Sydney Ford today, and she was given the above information in writing. She voices understanding and agreement with this plan. She will call with any changes or problems prior to her next appointment.   Adiah Guereca, PA-C   04/21/2013 11:28 AM

## 2013-04-28 ENCOUNTER — Other Ambulatory Visit (HOSPITAL_BASED_OUTPATIENT_CLINIC_OR_DEPARTMENT_OTHER): Payer: BC Managed Care – PPO

## 2013-04-28 ENCOUNTER — Encounter: Payer: Self-pay | Admitting: Physician Assistant

## 2013-04-28 ENCOUNTER — Telehealth: Payer: Self-pay | Admitting: *Deleted

## 2013-04-28 ENCOUNTER — Ambulatory Visit (HOSPITAL_BASED_OUTPATIENT_CLINIC_OR_DEPARTMENT_OTHER): Payer: BC Managed Care – PPO | Admitting: Physician Assistant

## 2013-04-28 ENCOUNTER — Ambulatory Visit (HOSPITAL_BASED_OUTPATIENT_CLINIC_OR_DEPARTMENT_OTHER): Payer: BC Managed Care – PPO

## 2013-04-28 VITALS — BP 138/82 | HR 74 | Temp 98.4°F | Resp 18 | Ht 62.0 in | Wt 228.3 lb

## 2013-04-28 DIAGNOSIS — C50411 Malignant neoplasm of upper-outer quadrant of right female breast: Secondary | ICD-10-CM

## 2013-04-28 DIAGNOSIS — Z5111 Encounter for antineoplastic chemotherapy: Secondary | ICD-10-CM

## 2013-04-28 DIAGNOSIS — R5383 Other fatigue: Secondary | ICD-10-CM

## 2013-04-28 DIAGNOSIS — R5381 Other malaise: Secondary | ICD-10-CM

## 2013-04-28 DIAGNOSIS — C50419 Malignant neoplasm of upper-outer quadrant of unspecified female breast: Secondary | ICD-10-CM

## 2013-04-28 DIAGNOSIS — Z171 Estrogen receptor negative status [ER-]: Secondary | ICD-10-CM

## 2013-04-28 DIAGNOSIS — F419 Anxiety disorder, unspecified: Secondary | ICD-10-CM

## 2013-04-28 DIAGNOSIS — G4700 Insomnia, unspecified: Secondary | ICD-10-CM | POA: Insufficient documentation

## 2013-04-28 LAB — COMPREHENSIVE METABOLIC PANEL (CC13)
ALBUMIN: 3.8 g/dL (ref 3.5–5.0)
ALT: 28 U/L (ref 0–55)
ANION GAP: 12 meq/L — AB (ref 3–11)
AST: 18 U/L (ref 5–34)
Alkaline Phosphatase: 93 U/L (ref 40–150)
BUN: 13.1 mg/dL (ref 7.0–26.0)
CO2: 22 meq/L (ref 22–29)
Calcium: 9.7 mg/dL (ref 8.4–10.4)
Chloride: 107 mEq/L (ref 98–109)
Creatinine: 0.9 mg/dL (ref 0.6–1.1)
Glucose: 101 mg/dl (ref 70–140)
POTASSIUM: 4.5 meq/L (ref 3.5–5.1)
SODIUM: 141 meq/L (ref 136–145)
TOTAL PROTEIN: 6.5 g/dL (ref 6.4–8.3)
Total Bilirubin: 0.39 mg/dL (ref 0.20–1.20)

## 2013-04-28 LAB — CBC WITH DIFFERENTIAL/PLATELET
BASO%: 0.3 % (ref 0.0–2.0)
BASOS ABS: 0 10*3/uL (ref 0.0–0.1)
EOS ABS: 0 10*3/uL (ref 0.0–0.5)
EOS%: 0.1 % (ref 0.0–7.0)
HEMATOCRIT: 36.8 % (ref 34.8–46.6)
HEMOGLOBIN: 12.5 g/dL (ref 11.6–15.9)
LYMPH%: 8.8 % — ABNORMAL LOW (ref 14.0–49.7)
MCH: 29.6 pg (ref 25.1–34.0)
MCHC: 34 g/dL (ref 31.5–36.0)
MCV: 87.2 fL (ref 79.5–101.0)
MONO#: 0.6 10*3/uL (ref 0.1–0.9)
MONO%: 8.2 % (ref 0.0–14.0)
NEUT#: 6.2 10*3/uL (ref 1.5–6.5)
NEUT%: 82.6 % — ABNORMAL HIGH (ref 38.4–76.8)
Platelets: 131 10*3/uL — ABNORMAL LOW (ref 145–400)
RBC: 4.22 10*6/uL (ref 3.70–5.45)
RDW: 13.3 % (ref 11.2–14.5)
WBC: 7.5 10*3/uL (ref 3.9–10.3)
lymph#: 0.7 10*3/uL — ABNORMAL LOW (ref 0.9–3.3)
nRBC: 0 % (ref 0–0)

## 2013-04-28 MED ORDER — DEXAMETHASONE SODIUM PHOSPHATE 20 MG/5ML IJ SOLN
12.0000 mg | Freq: Once | INTRAMUSCULAR | Status: AC
  Administered 2013-04-28: 12 mg via INTRAVENOUS

## 2013-04-28 MED ORDER — DOXORUBICIN HCL CHEMO IV INJECTION 2 MG/ML
60.0000 mg/m2 | Freq: Once | INTRAVENOUS | Status: AC
  Administered 2013-04-28: 128 mg via INTRAVENOUS
  Filled 2013-04-28: qty 64

## 2013-04-28 MED ORDER — SODIUM CHLORIDE 0.9 % IV SOLN
600.0000 mg/m2 | Freq: Once | INTRAVENOUS | Status: AC
  Administered 2013-04-28: 1280 mg via INTRAVENOUS
  Filled 2013-04-28: qty 64

## 2013-04-28 MED ORDER — SODIUM CHLORIDE 0.9 % IV SOLN
150.0000 mg | Freq: Once | INTRAVENOUS | Status: AC
  Administered 2013-04-28: 150 mg via INTRAVENOUS
  Filled 2013-04-28: qty 5

## 2013-04-28 MED ORDER — SODIUM CHLORIDE 0.9 % IV SOLN
Freq: Once | INTRAVENOUS | Status: AC
  Administered 2013-04-28: 11:00:00 via INTRAVENOUS

## 2013-04-28 MED ORDER — LORAZEPAM 2 MG/ML IJ SOLN
1.0000 mg | Freq: Once | INTRAMUSCULAR | Status: AC
  Administered 2013-04-28: 1 mg via INTRAVENOUS

## 2013-04-28 MED ORDER — TRAZODONE HCL 50 MG PO TABS: 50.0000 mg | ORAL_TABLET | Freq: Every evening | ORAL | Status: DC | PRN

## 2013-04-28 MED ORDER — SODIUM CHLORIDE 0.9 % IJ SOLN
10.0000 mL | INTRAMUSCULAR | Status: DC | PRN
  Administered 2013-04-28: 10 mL
  Filled 2013-04-28: qty 10

## 2013-04-28 MED ORDER — PALONOSETRON HCL INJECTION 0.25 MG/5ML
INTRAVENOUS | Status: AC
  Filled 2013-04-28: qty 5

## 2013-04-28 MED ORDER — DEXAMETHASONE SODIUM PHOSPHATE 20 MG/5ML IJ SOLN
INTRAMUSCULAR | Status: AC
  Filled 2013-04-28: qty 5

## 2013-04-28 MED ORDER — HEPARIN SOD (PORK) LOCK FLUSH 100 UNIT/ML IV SOLN
500.0000 [IU] | Freq: Once | INTRAVENOUS | Status: AC | PRN
  Administered 2013-04-28: 500 [IU]
  Filled 2013-04-28: qty 5

## 2013-04-28 MED ORDER — PALONOSETRON HCL INJECTION 0.25 MG/5ML
0.2500 mg | Freq: Once | INTRAVENOUS | Status: AC
  Administered 2013-04-28: 0.25 mg via INTRAVENOUS

## 2013-04-28 MED ORDER — LORAZEPAM 2 MG/ML IJ SOLN
INTRAMUSCULAR | Status: AC
  Filled 2013-04-28: qty 1

## 2013-04-28 NOTE — Progress Notes (Signed)
[place]  [phone] [phone]) [phone]     ID: Sydney Ford OB: 11-29-1955  MR#: [id number]  STM#:[id number]  PCP: Sydney Bolt, MD GYN:   SU: Sydney Bookbinder, MD OTHER MD: Sydney Blossom, MD  CHIEF COMPLAINT:  Right breast cancer/neoadjuvant chemotherapy   HISTORY OF PRESENT ILLNESS: Sydney Ford (pronounced "line 'em") herself noted a change in her right breast sometime October 2014 but "my breasts are like bean bags" and she initially ignored it. After while she began to feel a pole when she lay on her right side at night so she scheduled herself for screening mammography at the [facility] 02/08/2013 (prior mammogram had been April 2011), and this did show a possible mass in the right breast. Unilateral right diagnostic mammography and ultrasonography 02/28/2013 confirmed an irregular mass in the right breast upper outer quadrant measuring 2.4 cm. This was palpable at the 11:00 position. Ultrasound showed an irregular hypoechoic mass measuring 1.5 cm with no abnormal adenopathy in the right axilla.  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  The patient met with Dr. Donne Ford 03/14/2013 and he recommended primary systemic chemotherapy to decrease the size of the tumor and increase the chance of lumpectomy.   INTERVAL HISTORY: Sydney Ford returns alone today for followup of her locally advanced right breast carcinoma, due for day 1 cycle 3 of 4 planned dose dense cycles of doxorubicin/cyclophosphamide being given in the neoadjuvant setting. She receives Neulasta on day 2 for granulocyte support.  Sydney Ford's counts have recovered nicely. She completed a course of Cipro for neutropenia, and has had no fevers, chills, or night sweats. Her biggest complaint is  fatigue along with severe insomnia. Benadryl does not make her sleepy. She's tried taking lorazepam, and is also try taking the tramadol at night, neither of which she has found helpful. She has had some bony aches and pains this week, more so than usual. Her headaches continue to come and go but are stable. She's had no dizziness or change in vision. She feels a little forgetful at times.  REVIEW OF SYSTEMS: Sydney Ford has had no skin changes, rashes, abnormal bruising, or bleeding. Her appetite is fair. She continues to have some intermittent nausea, but no emesis. She's had no change in bowel or bladder habits. She's had little bit of a runny nose but denies any cough, shortness of breath, chest pain, or palpitations. She's had no peripheral swelling.  A detailed review of systems is otherwise stable and noncontributory.   PAST MEDICAL HISTORY: Past Medical History  Diagnosis Date  . Hypertension   . Hyperlipidemia   . Thyroid disease   . Osteoporosis   . Hypothyroidism     hashimoto's  . Depression   . Pneumonia     2014  . Cold (disease) 03/21/12    couple of weeks ago - pt states much better now  . History of kidney stones     "passed 9 stones"  . GERD (gastroesophageal reflux disease)     no meds except occas tums  . Headache(784.0)     occas migraine  . Cancer     new dx of right breast cancer - planning chemo first  . Arthritis     oa; lower back pain - hx of injury yrs ago-occas flare ups  . Anemia  2005 - related to heavy menses with the thyroid probl    PAST SURGICAL HISTORY: Past Surgical History  Procedure Laterality Date  . Cesarean section  1987  . Tonsillectomy    . Uterine ablation  2008    no menstrual periods since procedure  . Portacath placement N/A 03/23/2013    Procedure: INSERTION PORT-A-CATH;  Surgeon: Sydney Bookbinder, MD;  Location: [facility];  Service: General;  Laterality: N/A;    FAMILY HISTORY Family History  Problem Relation Age of Onset  .  Heart failure Mother   . Heart attack Father    the patient's father died at the age of 25 from a myocardial infarction. The patient's mother died at the age of 74 with congestive heart failure. She also had a history of lupus. The patient had no brothers or sisters. There is no history of breast or ovarian cancer in the family to her knowledge.  GYNECOLOGIC HISTORY:  Menarche age 57, first live birth age 19. She is GX P2. She underwent endometrial ablation in 2009 and has not had a period since that time. She never took hormone replacement. She did use birth control pills remotely with no complications  SOCIAL HISTORY: (Updated January 2015) Sydney Ford works at [employer] for a variety of long-term clients. She does not work for a company. Her husband Sydney Ford is semiretired. He works for a company that [employer] data on [place]. Son Sydney Ford is in [place] and works in Architect. Son Sydney Ford lives at home and is disabled with a diagnosis of paranoid schizophrenia. The patient has no grandchildren. She is not a church attender    ADVANCED DIRECTIVES: Not in place   HEALTH MAINTENANCE: (Updated January 2015) History  Substance Use Topics  . Smoking status: Former Smoker    Quit date: 03/17/1985  . Smokeless tobacco: Never Used  . Alcohol Use: No     Colonoscopy: Never  PAP: Remote  Bone density: November 2014, showed osteoporosis   according to the patient's report  Lipid panel:  Not on file, Sydney Ford   Allergies  Allergen Reactions  . Codeine Itching and Nausea Only    REACTION: Nausea and itching  . Other     Close contact with Some recycled plastics cause whelps    Current Outpatient Prescriptions  Medication Sig Dispense Refill  . atorvastatin (LIPITOR) 20 MG tablet Take 20 mg by mouth every evening.       . Cholecalciferol (VITAMIN D) 2000 UNITS CAPS Take 6,000 Units by mouth every morning.      . lidocaine-prilocaine (EMLA) cream Apply 1  application topically as needed.  30 g  0  . lisinopril (PRINIVIL,ZESTRIL) 20 MG tablet Take 20 mg by mouth every morning.       Marland Kitchen LORazepam (ATIVAN) 0.5 MG tablet Take 0.5 mg by mouth every 8 (eight) hours as needed for anxiety (or nausea).       . meloxicam (MOBIC) 15 MG tablet Take 15 mg by mouth every morning.       Marland Kitchen omeprazole (PRILOSEC) 40 MG capsule Take 1 capsule (40 mg total) by mouth daily.  30 capsule  4  . sertraline (ZOLOFT) 100 MG tablet Take 1 tablet (100 mg total) by mouth at bedtime.  30 tablet  1  . thyroid (ARMOUR) 90 MG tablet Take 90 mg by mouth every morning.      . traMADol (ULTRAM) 50 MG tablet Take 1-2 tablets (50-100 mg total) by mouth 3 (  three) times daily as needed for moderate pain or severe pain.  100 tablet  0  . cholestyramine (QUESTRAN) 4 G packet Take 1 packet (4 g total) by mouth 2 (two) times daily as needed (diarrhea). Take with meals.  30 each  1  . dexamethasone (DECADRON) 4 MG tablet Take 2 tablets by mouth once a day on the day after chemotherapy and then take 2 tablets two times a day for 2 days. Take with food.  30 tablet  1  . ondansetron (ZOFRAN) 8 MG tablet Take 1 tablet (8 mg total) by mouth 2 (two) times daily as needed. Take two times a day as needed for nausea or vomiting starting on the third day after chemotherapy.  30 tablet  1  . prochlorperazine (COMPAZINE) 10 MG tablet Take 1 tablet (10 mg total) by mouth every 6 (six) hours as needed (Nausea or vomiting).  30 tablet  1  . traZODone (DESYREL) 50 MG tablet Take 1 tablet (50 mg total) by mouth at bedtime as needed for sleep.  30 tablet  1   No current facility-administered medications for this visit.    OBJECTIVE: Middle-aged white woman in no acute distress Filed Vitals:   04/28/13 0903  BP: 138/82  Pulse: 74  Temp: 98.4 F (36.9 C)  Resp: 18     Body mass index is 41.75 kg/(m^2).    ECOG FS: 1 Filed Weights   04/28/13 0903  Weight: 228 lb 4.8 oz (103.556 kg)   Physical  Exam: HEENT:  Sclerae anicteric.  Oropharynx clear and moist, with no ulcerations or candidiasis. Neck is supple. Trachea midline. No thyromegaly. NODES:  No cervical or supraclavicular lymphadenopathy palpated.  BREAST EXAM: Palpable mass in the upper outer quadrant of the right breast is softer and smaller, measuring slightly less than 1 cm today, and a little more difficult to find. Left breast is unremarkable. No axillary lymphadenopathy. LUNGS:  Clear to auscultation bilaterally.  No wheezes, rales, or rhonchi HEART:  Regular rate and rhythm. No murmur appreciated ABDOMEN:  Soft, obese, nontender. No hepatomegaly. Positive bowel sounds.  MSK:  No focal spinal tenderness to palpation. There is some tenderness to palpation in the lower anterior ribs. Good range of motion bilaterally in the upper extremities. EXTREMITIES:  No peripheral edema.   SKIN:  Skin is benign with no skin lesions or rashes. No nail dyscrasia. No pallor. Port is intact in the left upper chest wall with no erythema, edema, or evidence of infection/cellulitis. NEURO:  Nonfocal. Well oriented.  Fatigued affect.    LAB RESULTS:  Lab Results  Component Value Date   WBC 7.5 04/28/2013   NEUTROABS 6.2 04/28/2013   HGB 12.5 04/28/2013   HCT 36.8 04/28/2013   MCV 87.2 04/28/2013   PLT 131* 04/28/2013      Chemistry      Component Value Date/Time   NA 141 04/28/2013 0846   NA 141 08/16/2007 0833   K 4.5 04/28/2013 0846   K 4.5 08/16/2007 0833   CL 105 08/16/2007 0833   CO2 22 04/28/2013 0846   CO2 27 08/16/2007 0833   BUN 13.1 04/28/2013 0846   BUN 14 08/16/2007 0833   CREATININE 0.9 04/28/2013 0846   CREATININE 0.9 08/16/2007 0833      Component Value Date/Time   CALCIUM 9.7 04/28/2013 0846   CALCIUM 9.7 08/16/2007 0833   ALKPHOS 93 04/28/2013 0846   ALKPHOS 64 08/16/2007 0833   AST 18 04/28/2013 0846   AST  25 08/16/2007 0833   ALT 28 04/28/2013 0846   ALT 30 08/16/2007 0833   BILITOT 0.39 04/28/2013 0846   BILITOT 1.0 08/16/2007 2563        STUDIES:   Mr Breast Bilateral W Wo Contrast 03/08/2013   CLINICAL DATA:  Recent diagnosis of invasive mammary carcinoma following ultrasound-guided biopsy of a right breast mass at 11 o'clock.  EXAM: BILATERAL BREAST MRI WITH AND WITHOUT CONTRAST  LABS:  BUN and creatinine were obtained on site at [facility] at  [address]  Results:  BUN 15 mg/dL,  Creatinine 0.9 mg/dL.  TECHNIQUE: Multiplanar, multisequence MR images of both breasts were obtained prior to and following the intravenous administration of 54m of MultiHance.  THREE-DIMENSIONAL MR IMAGE RENDERING ON INDEPENDENT WORKSTATION:  Three-dimensional MR images were rendered by post-processing of the original MR data on an independent workstation. The three-dimensional MR images were interpreted, and findings are reported in the following complete MRI report for this study. Three dimensional images were evaluated at the independent DynaCad workstation  COMPARISON:  Previous exams  FINDINGS: Breast composition: c:  Heterogeneous fibroglandular tissue  Background parenchymal enhancement: Mild  Right breast: A 3 x 2 x 1.7 cm irregular enhancing mass is identified in the upper-outer right breast, consistent with the biopsy-proven invasive mammary carcinoma. The mass is associated with mixed kinetics, which are predominantly washout and plateau. Susceptibility artifact is identified within the mass, consistent with the biopsy site marker. No additional areas of abnormal enhancement.  Left breast: No mass or abnormal enhancement.  Lymph nodes: No abnormal appearing lymph nodes.  Ancillary findings: A diffusely enlarged thyroid gland is identified. This was noted on a previous thyroid ultrasound in 2009. Additionally, there is a T2 hyperintense lesion in close approximation to the thyroid isthmus. This measures 1.6 x 1.4 x 0.6 cm.  IMPRESSION: 1. A 3 x 2 x 1.7 cm irregularly enhancing mass in the upper-outer right breast, consistent  with the biopsy-proven invasive mammary carcinoma. 2. T2 hyperintense lesion in close approximation to the thyroid isthmus. This measures up to 1.6 cm. The thyroid is diffusely enlarged, as seen on a prior thyroid ultrasound.  RECOMMENDATION: Treatment planning for biopsy-proven right breast carcinoma. Thyroid ultrasound is recommended for T2 hyperintense lesion in close approximation to the thyroid isthmus.  BI-RADS CATEGORY  6: Known biopsy-proven malignancy - appropriate action should be taken.   Electronically Signed   By: MDonavan BurnetM.D.   On: 03/08/2013 15:29     ASSESSMENT: 58y.o. 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  (2)  being treated in the neoadjuvant setting, the plan being to complete 4 dose dense cycles of doxorubicin/cyclophosphamide, first dose on 03/31/2013. She will be given Neulasta on day 2 for granulocyte support. This regimen will be followed by 12 weekly doses of carboplatin/paclitaxel prior to definitive surgery.   (3)  afebrile chemotherapy-induced neutropenia, resolved  (4)  Diarrhea, resolved  (5)  Insomnia  PLAN: Sydney Ford will receive her third dose of neoadjuvant doxorubicin/cyclophosphamide today, and will return tomorrow for her Neulasta injection as planned. I will see her next week on February 18 for repeat labs and physical exam. Her fourth cycle of doxorubicin/cyclophosphamide is scheduled for February 26.  We will repeat Sydney Ford's Breast MRI on 05/17/2013 to assess response to therapy after completion of 4 cycles of neoadjuvant doxorubicin/cyclophosphamide  before proceeding to her next regimen which will consist of 12 weekly doses of carboplatin/paclitaxel.  We are going to try a low dose of trazodone at night to see if this helps with the insomnia. I have prescribed 50 mg to be taken at night as needed.    All of  the above was reviewed with Sydney Ford today, and she voices understanding and agreement with this plan. She will call with any changes or problems prior to her next appointment on the 18th.   [place] Jon, PA-C   04/28/2013 9:45 AM

## 2013-04-28 NOTE — Telephone Encounter (Signed)
Per staff message and POF I have scheduled appts.  JMW  

## 2013-04-29 ENCOUNTER — Ambulatory Visit (HOSPITAL_BASED_OUTPATIENT_CLINIC_OR_DEPARTMENT_OTHER): Payer: BC Managed Care – PPO

## 2013-04-29 VITALS — BP 119/65 | HR 101 | Temp 98.6°F

## 2013-04-29 DIAGNOSIS — Z5189 Encounter for other specified aftercare: Secondary | ICD-10-CM

## 2013-04-29 DIAGNOSIS — C50419 Malignant neoplasm of upper-outer quadrant of unspecified female breast: Secondary | ICD-10-CM

## 2013-04-29 DIAGNOSIS — C50411 Malignant neoplasm of upper-outer quadrant of right female breast: Secondary | ICD-10-CM

## 2013-04-29 MED ORDER — PEGFILGRASTIM INJECTION 6 MG/0.6ML
6.0000 mg | Freq: Once | SUBCUTANEOUS | Status: AC
  Administered 2013-04-29: 6 mg via SUBCUTANEOUS
  Filled 2013-04-29: qty 0.6

## 2013-05-03 ENCOUNTER — Telehealth: Payer: Self-pay | Admitting: Oncology

## 2013-05-03 ENCOUNTER — Telehealth: Payer: Self-pay | Admitting: *Deleted

## 2013-05-03 NOTE — Telephone Encounter (Signed)
This RN spoke with pt per her call stating due to weather she would be unable to make appointment for tomorrow. She states she has cipro on hand and will start it as usual post chemo. Tomorrow is d8 check post treatment.  Pt understands to call for additional concerns.  No other needs at present.

## 2013-05-03 NOTE — Telephone Encounter (Signed)
, °

## 2013-05-04 ENCOUNTER — Telehealth: Payer: Self-pay | Admitting: *Deleted

## 2013-05-04 ENCOUNTER — Ambulatory Visit: Payer: BC Managed Care – PPO | Admitting: Physician Assistant

## 2013-05-04 ENCOUNTER — Other Ambulatory Visit: Payer: BC Managed Care – PPO

## 2013-05-04 NOTE — Telephone Encounter (Signed)
Left message for pt to return my call so I can schedule a genetic appt.  

## 2013-05-09 ENCOUNTER — Telehealth: Payer: Self-pay | Admitting: *Deleted

## 2013-05-09 MED ORDER — VENLAFAXINE HCL 37.5 MG PO TABS: ORAL_TABLET | ORAL | Status: DC

## 2013-05-09 NOTE — Telephone Encounter (Signed)
Patient calling in to report symptoms she has been having the past 3 days and now she is concerned since friends are telling her she could be having a "silent heart attack". Her complaints consist of sporatic nausea, not accompanied by vomiting, then she breaks out into profuse sweating and facial flushing, so much sweat she is drenched. She states that after the 'episode' she feels wiped out. She does not have any complaints of chest pain, palpitations, dyspnea, or heaviness in the chest. Discussed with Brooke Dare, we will start patient on Effexor 37.5mg  at bedtime. Patient encouraged to see Urgent Care or ER if she ever feels uncertain if this is a cardiac event. Patient verbalized understanding of plan.

## 2013-05-10 ENCOUNTER — Telehealth: Payer: Self-pay | Admitting: *Deleted

## 2013-05-10 NOTE — Telephone Encounter (Signed)
Left message for pt to return my call so I can schedule a genetic appt w/ her.  

## 2013-05-11 ENCOUNTER — Telehealth: Payer: Self-pay | Admitting: *Deleted

## 2013-05-11 ENCOUNTER — Other Ambulatory Visit: Payer: Self-pay | Admitting: Physician Assistant

## 2013-05-11 NOTE — Telephone Encounter (Signed)
Patient called and canceled appt due to the weather. She wants to reschedule, I have told her we will ask the doctor where to move it.   JMW

## 2013-05-11 NOTE — Progress Notes (Signed)
I spoke with Sydney Ford by phone today. She has canceled her appointment here tomorrow which included her fourth cycle of AC due to the inclement weather. She is currently in [place], and would be unable to make it later this week for her appointment. I did offer her chemotherapy this afternoon, but she was unable to take that appointment.  Per her request, we are adding her fourth infusion of AC on 05/19/2013, next Thursday, when she returns to see Dr. Jana Hakim. Of course she will be scheduled for her Neulasta injection on day 2.  A POF has been generated to add these appointments, and the schedulers will contact her with the time of the injection on the sixth.  Micah Flesher, PA-C 05/11/2013

## 2013-05-11 NOTE — Telephone Encounter (Signed)
sw pt made her aware that tx will be added for 05/19/13 and gv appt for inj 05/20/13@ 3p. Pt is aware. i emailed MW to add the tx...td

## 2013-05-12 ENCOUNTER — Other Ambulatory Visit: Payer: BC Managed Care – PPO

## 2013-05-12 ENCOUNTER — Ambulatory Visit: Payer: BC Managed Care – PPO

## 2013-05-12 ENCOUNTER — Telehealth: Payer: Self-pay | Admitting: *Deleted

## 2013-05-12 ENCOUNTER — Ambulatory Visit: Payer: BC Managed Care – PPO | Admitting: Physician Assistant

## 2013-05-12 NOTE — Telephone Encounter (Signed)
Received appt from Santiago Glad.  Called and confirmed 05/17/13 genetic appt w/ pt.  Unable to schedule the appt for Santiago Glad - emailed Santiago Glad and asked if she could enter it.

## 2013-05-12 NOTE — Telephone Encounter (Signed)
Called and spoke with patient about getting her genetic appt scheduled and she was under the impression that is was going to be done at the time of her chemo next week.  I informed her that no - that's not how it work and explained the process to her.  I gave her an appt time and date and then she informed me that the appt was too far out because the genetic results would determine her surgery decision.  I told her that I would get with the counselor and get back with her.  I emailed Carmelina Dane for an appt time and date.

## 2013-05-13 ENCOUNTER — Encounter: Payer: Self-pay | Admitting: Oncology

## 2013-05-13 ENCOUNTER — Ambulatory Visit: Payer: BC Managed Care – PPO

## 2013-05-13 ENCOUNTER — Telehealth: Payer: Self-pay | Admitting: *Deleted

## 2013-05-13 MED ORDER — AMOXICILLIN-POT CLAVULANATE 875-125 MG PO TABS: 1.0000 | ORAL_TABLET | Freq: Two times a day (BID) | ORAL | Status: DC

## 2013-05-13 NOTE — Telephone Encounter (Signed)
Received call from pt stating that she is running fever of 100 degrees with runny nose, cough, & SOB.  She has yellow green secretions from nose.  She is concerned about what to do if her fever goes up & needs a plan.  She states she has a script of cipro & wants to know if she is to start this.   Discussed with Dr Jana Hakim & he would like her to start the cipro & also some augmentin, both time 5 days whether fever or not. She is also to go to the ED if temp goes up higher-100.5 or greater.

## 2013-05-17 ENCOUNTER — Ambulatory Visit (HOSPITAL_COMMUNITY)
Admission: RE | Admit: 2013-05-17 | Discharge: 2013-05-17 | Disposition: A | Discharge status: Home / Self Care | Payer: BC Managed Care – PPO | Source: Ambulatory Visit | Attending: Physician Assistant | Admitting: Physician Assistant

## 2013-05-17 ENCOUNTER — Ambulatory Visit (HOSPITAL_BASED_OUTPATIENT_CLINIC_OR_DEPARTMENT_OTHER): Payer: BC Managed Care – PPO | Admitting: Genetic Counselor

## 2013-05-17 ENCOUNTER — Encounter: Payer: Self-pay | Admitting: Genetic Counselor

## 2013-05-17 ENCOUNTER — Other Ambulatory Visit (HOSPITAL_BASED_OUTPATIENT_CLINIC_OR_DEPARTMENT_OTHER): Payer: BC Managed Care – PPO

## 2013-05-17 DIAGNOSIS — C50411 Malignant neoplasm of upper-outer quadrant of right female breast: Secondary | ICD-10-CM

## 2013-05-17 DIAGNOSIS — Z801 Family history of malignant neoplasm of trachea, bronchus and lung: Secondary | ICD-10-CM

## 2013-05-17 DIAGNOSIS — C50419 Malignant neoplasm of upper-outer quadrant of unspecified female breast: Secondary | ICD-10-CM

## 2013-05-17 LAB — CBC WITH DIFFERENTIAL/PLATELET
BASO%: 0.8 % (ref 0.0–2.0)
Basophils Absolute: 0.1 10*3/uL (ref 0.0–0.1)
EOS%: 0.4 % (ref 0.0–7.0)
Eosinophils Absolute: 0 10*3/uL (ref 0.0–0.5)
HCT: 32.7 % — ABNORMAL LOW (ref 34.8–46.6)
HGB: 11.1 g/dL — ABNORMAL LOW (ref 11.6–15.9)
LYMPH#: 0.4 10*3/uL — AB (ref 0.9–3.3)
LYMPH%: 4.7 % — ABNORMAL LOW (ref 14.0–49.7)
MCH: 30 pg (ref 25.1–34.0)
MCHC: 33.9 g/dL (ref 31.5–36.0)
MCV: 88.5 fL (ref 79.5–101.0)
MONO#: 1.1 10*3/uL — ABNORMAL HIGH (ref 0.1–0.9)
MONO%: 13.3 % (ref 0.0–14.0)
NEUT#: 6.4 10*3/uL (ref 1.5–6.5)
NEUT%: 80.8 % — ABNORMAL HIGH (ref 38.4–76.8)
Platelets: 339 10*3/uL (ref 145–400)
RBC: 3.69 10*6/uL — AB (ref 3.70–5.45)
RDW: 15.2 % — ABNORMAL HIGH (ref 11.2–14.5)
WBC: 8 10*3/uL (ref 3.9–10.3)

## 2013-05-17 LAB — COMPREHENSIVE METABOLIC PANEL (CC13)
ALT: 51 U/L (ref 0–55)
ANION GAP: 10 meq/L (ref 3–11)
AST: 38 U/L — AB (ref 5–34)
Albumin: 3.5 g/dL (ref 3.5–5.0)
Alkaline Phosphatase: 82 U/L (ref 40–150)
BILIRUBIN TOTAL: 0.51 mg/dL (ref 0.20–1.20)
BUN: 17.2 mg/dL (ref 7.0–26.0)
CALCIUM: 10.2 mg/dL (ref 8.4–10.4)
CHLORIDE: 106 meq/L (ref 98–109)
CO2: 25 meq/L (ref 22–29)
Creatinine: 1.1 mg/dL (ref 0.6–1.1)
Glucose: 120 mg/dl (ref 70–140)
Potassium: 4.6 mEq/L (ref 3.5–5.1)
SODIUM: 141 meq/L (ref 136–145)
TOTAL PROTEIN: 7 g/dL (ref 6.4–8.3)

## 2013-05-17 MED ORDER — GADOBENATE DIMEGLUMINE 529 MG/ML IV SOLN
20.0000 mL | Freq: Once | INTRAVENOUS | Status: AC | PRN
  Administered 2013-05-17: 20 mL via INTRAVENOUS

## 2013-05-17 NOTE — Progress Notes (Signed)
Dr.  Sarajane Jews Magrinat requested a consultation for genetic counseling and risk assessment for Sydney Ford, a 58 y.o. female, for discussion of her personal history of breast cancer.  She presents to clinic today to discuss the possibility of a genetic predisposition to cancer, and to further clarify her risks, as well as her family members' risks for cancer.   HISTORY OF PRESENT ILLNESS: In 2014, at the age of 13, Sydney Ford was diagnosed with invasive ductal carcinoma of the right breast. This was treated with chemotherapy and she is scheduled for surgery, based on her genetic test results, and radiaiton. The tumor is triple negative.  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    Past Medical History  Diagnosis Date  . Hypertension   . Hyperlipidemia   . Thyroid disease   . Osteoporosis   . Hypothyroidism     hashimoto's  . Depression   . Pneumonia     2014  . Cold (disease) 03/21/12    couple of weeks ago - pt states much better now  . History of kidney stones     "passed 9 stones"  . GERD (gastroesophageal reflux disease)     no meds except occas tums  . Headache(784.0)     occas migraine  . Cancer     new dx of right breast cancer - planning chemo first  . Arthritis     oa; lower back pain - hx of injury yrs ago-occas flare ups  . Anemia     2005 - related to heavy menses with the thyroid probl    Past Surgical History  Procedure Laterality Date  . Cesarean section  1987  . Tonsillectomy    . Uterine ablation  2008    no menstrual periods since procedure  . Portacath placement N/A 03/23/2013    Procedure: INSERTION PORT-A-CATH;  Surgeon: Rolm Bookbinder, MD;  Location: [facility];  Service: General;  Laterality: N/A;    History   Social History  . Marital Status: Married    Spouse Name: N/A    Number of Children: 2  . Years of Education: N/A   Social History Main Topics  . Smoking status: Former Smoker    Quit date: 03/17/1985  . Smokeless  tobacco: Never Used  . Alcohol Use: No  . Drug Use: No  . Sexual Activity: None   Other Topics Concern  . None   Social History Narrative  . None    REPRODUCTIVE HISTORY AND PERSONAL RISK ASSESSMENT FACTORS: Menarche was at age 60.   postmenopausal Uterus Intact: yes Ovaries Intact: yes G3P2A1, first live birth at age 32  She has not previously undergone treatment for infertility.   Oral Contraceptive use: 8 years   She has not used HRT in the past.    FAMILY HISTORY:  We obtained a detailed, 4-generation family history.  Significant diagnoses are listed below: Family History  Problem Relation Age of Onset  . Heart failure Mother   . Heart attack Father   . Autoimmune disease Maternal Aunt     lupus, scelraderma, reynolds disease, and others  . Stroke Paternal Uncle   . Heart failure Maternal Grandmother   . Heart attack Maternal Grandfather   . Throat cancer Paternal Grandmother     dx late 42s; non smoker or drinker  . Heart attack Paternal Grandfather   . Schizophrenia Son 34  . Heart attack Paternal [place] Meo reports that all of her maternal  aunts had autoimmune disorders, including scleraderma, lupus and reynolds disease.  Her son has an autoimmue disease that affects the eyes, and she has hashimotos thyroiditis.  Patient's maternal ancestors are of [place] and [place] descent, and paternal ancestors are of [place] descent. There is no reported Ashkenazi Jewish ancestry. There is no known consanguinity.  GENETIC COUNSELING ASSESSMENT: Sydney Ford is a 58 y.o. female with a personal history of triple negative breast cancer which somewhat suggestive of a hereditary cancer syndrome and predisposition to cancer. We, therefore, discussed and recommended the following at today's visit.   DISCUSSION: We reviewed the characteristics, features and inheritance patterns of hereditary cancer syndromes. We also discussed genetic testing, including the appropriate family members  to test, the process of testing, insurance coverage and turn-around-time for results. We reviewed the hereditary cancer genes associated with triple negative breast cancer, including BRCA1, BRCA2 and PALB2.  PLAN: After considering the risks, benefits, and limitations, Sydney Ford provided informed consent to pursue genetic testing and the blood sample will be sent to [facility] for analysis of the Breast/Ovarian Cancer panel. We discussed the implications of a positive, negative and/ or variant of uncertain significance genetic test result. Results should be available within approximately 3 weeks' time, at which point they will be disclosed by telephone to Sydney Ford, as will any additional recommendations warranted by these results. Sydney Ford will receive a summary of her genetic counseling visit and a copy of her results once available. This information will also be available in Epic. We encouraged Sydney Ford to remain in contact with cancer genetics annually so that we can continuously update the family history and inform her of any changes in cancer genetics and testing that may be of benefit for her family. Sydney Ford questions were answered to her satisfaction today. Our contact information was provided should additional questions or concerns arise.  The patient was seen for a total of 60 minutes, greater than 50% of which was spent face-to-face counseling.  This note will also be sent to the referring provider via the electronic medical record. The patient will be supplied with a summary of this genetic counseling discussion as well as educational information on the discussed hereditary cancer syndromes following the conclusion of their visit.   Patient was discussed with Dr. Marcy Panning.   _______________________________________________________________________ For Office Staff:  Number of people involved in session: 1 Was an Intern/ student involved with  case: no

## 2013-05-19 ENCOUNTER — Ambulatory Visit (HOSPITAL_BASED_OUTPATIENT_CLINIC_OR_DEPARTMENT_OTHER): Payer: BC Managed Care – PPO

## 2013-05-19 ENCOUNTER — Ambulatory Visit (HOSPITAL_COMMUNITY)
Admission: RE | Admit: 2013-05-19 | Discharge: 2013-05-19 | Disposition: A | Discharge status: Home / Self Care | Payer: BC Managed Care – PPO | Source: Ambulatory Visit | Attending: Oncology | Admitting: Oncology

## 2013-05-19 ENCOUNTER — Other Ambulatory Visit (HOSPITAL_BASED_OUTPATIENT_CLINIC_OR_DEPARTMENT_OTHER): Payer: BC Managed Care – PPO

## 2013-05-19 ENCOUNTER — Ambulatory Visit (HOSPITAL_BASED_OUTPATIENT_CLINIC_OR_DEPARTMENT_OTHER): Payer: BC Managed Care – PPO | Admitting: Oncology

## 2013-05-19 VITALS — BP 118/75 | HR 121 | Temp 98.4°F | Resp 18 | Ht 62.0 in | Wt 222.4 lb

## 2013-05-19 DIAGNOSIS — C50419 Malignant neoplasm of upper-outer quadrant of unspecified female breast: Secondary | ICD-10-CM

## 2013-05-19 DIAGNOSIS — Z171 Estrogen receptor negative status [ER-]: Secondary | ICD-10-CM

## 2013-05-19 DIAGNOSIS — K219 Gastro-esophageal reflux disease without esophagitis: Secondary | ICD-10-CM

## 2013-05-19 DIAGNOSIS — C50411 Malignant neoplasm of upper-outer quadrant of right female breast: Secondary | ICD-10-CM

## 2013-05-19 DIAGNOSIS — R030 Elevated blood-pressure reading, without diagnosis of hypertension: Secondary | ICD-10-CM

## 2013-05-19 DIAGNOSIS — T451X5A Adverse effect of antineoplastic and immunosuppressive drugs, initial encounter: Secondary | ICD-10-CM

## 2013-05-19 DIAGNOSIS — R0602 Shortness of breath: Secondary | ICD-10-CM | POA: Insufficient documentation

## 2013-05-19 DIAGNOSIS — M1990 Unspecified osteoarthritis, unspecified site: Secondary | ICD-10-CM

## 2013-05-19 DIAGNOSIS — M79609 Pain in unspecified limb: Secondary | ICD-10-CM

## 2013-05-19 DIAGNOSIS — Z452 Encounter for adjustment and management of vascular access device: Secondary | ICD-10-CM

## 2013-05-19 DIAGNOSIS — D701 Agranulocytosis secondary to cancer chemotherapy: Secondary | ICD-10-CM

## 2013-05-19 DIAGNOSIS — C50919 Malignant neoplasm of unspecified site of unspecified female breast: Secondary | ICD-10-CM | POA: Insufficient documentation

## 2013-05-19 DIAGNOSIS — Z5111 Encounter for antineoplastic chemotherapy: Secondary | ICD-10-CM

## 2013-05-19 DIAGNOSIS — J069 Acute upper respiratory infection, unspecified: Secondary | ICD-10-CM

## 2013-05-19 LAB — CBC WITH DIFFERENTIAL/PLATELET
BASO%: 0.5 % (ref 0.0–2.0)
Basophils Absolute: 0 10*3/uL (ref 0.0–0.1)
EOS%: 0.5 % (ref 0.0–7.0)
Eosinophils Absolute: 0 10*3/uL (ref 0.0–0.5)
HEMATOCRIT: 31.6 % — AB (ref 34.8–46.6)
HGB: 10.5 g/dL — ABNORMAL LOW (ref 11.6–15.9)
LYMPH#: 0.9 10*3/uL (ref 0.9–3.3)
LYMPH%: 14.2 % (ref 14.0–49.7)
MCH: 29.2 pg (ref 25.1–34.0)
MCHC: 33.2 g/dL (ref 31.5–36.0)
MCV: 87.8 fL (ref 79.5–101.0)
MONO#: 0.5 10*3/uL (ref 0.1–0.9)
MONO%: 8.2 % (ref 0.0–14.0)
NEUT#: 4.6 10*3/uL (ref 1.5–6.5)
NEUT%: 76.6 % (ref 38.4–76.8)
Platelets: 344 10*3/uL (ref 145–400)
RBC: 3.6 10*6/uL — ABNORMAL LOW (ref 3.70–5.45)
RDW: 16 % — ABNORMAL HIGH (ref 11.2–14.5)
WBC: 6 10*3/uL (ref 3.9–10.3)
nRBC: 0 % (ref 0–0)

## 2013-05-19 MED ORDER — SODIUM CHLORIDE 0.9 % IV SOLN
Freq: Once | INTRAVENOUS | Status: AC
  Administered 2013-05-19: 11:00:00 via INTRAVENOUS

## 2013-05-19 MED ORDER — SODIUM CHLORIDE 0.9 % IJ SOLN
10.0000 mL | INTRAMUSCULAR | Status: DC | PRN
  Administered 2013-05-19: 10 mL
  Filled 2013-05-19: qty 10

## 2013-05-19 MED ORDER — ALTEPLASE 2 MG IJ SOLR
2.0000 mg | Freq: Once | INTRAMUSCULAR | Status: AC | PRN
  Administered 2013-05-19: 2 mg
  Filled 2013-05-19: qty 2

## 2013-05-19 MED ORDER — DEXAMETHASONE SODIUM PHOSPHATE 20 MG/5ML IJ SOLN
12.0000 mg | Freq: Once | INTRAMUSCULAR | Status: AC
  Administered 2013-05-19: 12 mg via INTRAVENOUS

## 2013-05-19 MED ORDER — HEPARIN SOD (PORK) LOCK FLUSH 100 UNIT/ML IV SOLN
500.0000 [IU] | Freq: Once | INTRAVENOUS | Status: AC | PRN
  Administered 2013-05-19: 500 [IU]
  Filled 2013-05-19: qty 5

## 2013-05-19 MED ORDER — PALONOSETRON HCL INJECTION 0.25 MG/5ML
0.2500 mg | Freq: Once | INTRAVENOUS | Status: AC
  Administered 2013-05-19: 0.25 mg via INTRAVENOUS

## 2013-05-19 MED ORDER — LORAZEPAM 2 MG/ML IJ SOLN
1.0000 mg | Freq: Once | INTRAMUSCULAR | Status: AC
  Administered 2013-05-19: 1 mg via INTRAVENOUS

## 2013-05-19 MED ORDER — DEXAMETHASONE SODIUM PHOSPHATE 20 MG/5ML IJ SOLN
INTRAMUSCULAR | Status: AC
  Filled 2013-05-19: qty 5

## 2013-05-19 MED ORDER — PALONOSETRON HCL INJECTION 0.25 MG/5ML
INTRAVENOUS | Status: AC
  Filled 2013-05-19: qty 5

## 2013-05-19 MED ORDER — SODIUM CHLORIDE 0.9 % IV SOLN
600.0000 mg/m2 | Freq: Once | INTRAVENOUS | Status: DC
  Filled 2013-05-19: qty 64

## 2013-05-19 MED ORDER — SODIUM CHLORIDE 0.9 % IV SOLN
150.0000 mg | Freq: Once | INTRAVENOUS | Status: AC
  Administered 2013-05-19: 150 mg via INTRAVENOUS
  Filled 2013-05-19: qty 5

## 2013-05-19 MED ORDER — LORAZEPAM 2 MG/ML IJ SOLN
INTRAMUSCULAR | Status: AC
  Filled 2013-05-19: qty 1

## 2013-05-19 MED ORDER — CYCLOPHOSPHAMIDE CHEMO INJECTION 1 GM
1280.0000 mg | Freq: Once | INTRAMUSCULAR | Status: AC
  Administered 2013-05-19: 1280 mg via INTRAVENOUS
  Filled 2013-05-19: qty 64

## 2013-05-19 MED ORDER — DOXORUBICIN HCL CHEMO IV INJECTION 2 MG/ML
60.0000 mg/m2 | Freq: Once | INTRAVENOUS | Status: AC
  Administered 2013-05-19: 128 mg via INTRAVENOUS
  Filled 2013-05-19: qty 64

## 2013-05-19 NOTE — Progress Notes (Signed)
[place]  [phone] [phone]) [phone]     ID: PAITYNN MIKUS OB: 01-18-1956  MR#: [id number]  JYN#:[id number]  PCP: Dwan Bolt, MD GYN:   SU: Rolm Bookbinder, MD OTHER MD: Ulyess Blossom, MD  CHIEF COMPLAINT:  Right breast cancer/neoadjuvant chemotherapy   HISTORY OF PRESENT ILLNESS: Sydney Ford (pronounced "line 'em") herself noted a change in her right breast sometime October 2014 but "my breasts are like bean bags" and she initially ignored it. After while she began to feel a pole when she lay on her right side at night so she scheduled herself for screening mammography at the [facility] 02/08/2013 (prior mammogram had been April 2011), and this did show a possible mass in the right breast. Unilateral right diagnostic mammography and ultrasonography 02/28/2013 confirmed an irregular mass in the right breast upper outer quadrant measuring 2.4 cm. This was palpable at the 11:00 position. Ultrasound showed an irregular hypoechoic mass measuring 1.5 cm with no abnormal adenopathy in the right axilla.  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  The patient met with Dr. Donne Hazel 03/14/2013 and he recommended primary systemic chemotherapy to decrease the size of the tumor and increase the chance of lumpectomy.   INTERVAL HISTORY: [name] returns with her husband Clair Gulling today for followup of her locally advanced right breast carcinoma, due for day 1 cycle 4 of 4 planned dose dense cycles of doxorubicin/cyclophosphamide being given in the neoadjuvant setting. This cycle was delayed one week partly because of weather.  Since her last cycle here she had a repeat breast MRI which shows a marked decrease in size in her measurable disease in the  breast  REVIEW OF SYSTEMS: In addition to concerns regarding the weather, [name] had an upper respiratory illness which caused her to be short of breath, with a dry cough, headaches, nausea, and a temperature between 99.2 and 100.8. She had an episode of "feeling faint". There was no chest pain or pressure as she was not aware of palpitations at that time. She was treated with antibiotics and is just completing her Cipro. She tells me that those symptoms are no better, but she still has "itchy ears" palpitations, poor appetite, back and joint pain, and her occasional headaches, which are not migraines. A detailed review of systems today was otherwise noncontributory   PAST MEDICAL HISTORY: Past Medical History  Diagnosis Date  . Hypertension   . Hyperlipidemia   . Thyroid disease   . Osteoporosis   . Hypothyroidism     hashimoto's  . Depression   . Pneumonia     2014  . Cold (disease) 03/21/12    couple of weeks ago - pt states much better now  . History of kidney stones     "passed 9 stones"  . GERD (gastroesophageal reflux disease)     no meds except occas tums  . Headache(784.0)     occas migraine  . Cancer     new dx of right breast cancer - planning chemo first  . Arthritis     oa; lower back pain - hx of injury yrs ago-occas flare ups  . Anemia     2005 - related to heavy menses with the thyroid probl    PAST SURGICAL HISTORY: Past Surgical History  Procedure Laterality Date  . Cesarean  section  1987  . Tonsillectomy    . Uterine ablation  2008    no menstrual periods since procedure  . Portacath placement N/A 03/23/2013    Procedure: INSERTION PORT-A-CATH;  Surgeon: Rolm Bookbinder, MD;  Location: [facility];  Service: General;  Laterality: N/A;    FAMILY HISTORY Family History  Problem Relation Age of Onset  . Heart failure Mother   . Heart attack Father   . Autoimmune disease Maternal Aunt     lupus, scelraderma, reynolds disease, and others  . Stroke Paternal  Uncle   . Heart failure Maternal Grandmother   . Heart attack Maternal Grandfather   . Throat cancer Paternal Grandmother     dx late 79s; non smoker or drinker  . Heart attack Paternal Grandfather   . Schizophrenia Son 60  . Heart attack Paternal Uncle    the patient's father died at the age of 24 from a myocardial infarction. The patient's mother died at the age of 48 with congestive heart failure. She also had a history of lupus. The patient had no brothers or sisters. There is no history of breast or ovarian cancer in the family to her knowledge.  GYNECOLOGIC HISTORY:  Menarche age 58, first live birth age 33. She is GX P2. She underwent endometrial ablation in 2009 and has not had a period since that time. She never took hormone replacement. She did use birth control pills remotely with no complications  SOCIAL HISTORY: (Updated January 2015) [name] works at [employer] for a variety of long-term clients. She does not work for a company. Her husband [name] is semiretired. He works for a company that [employer] data on [place]. Son [name] is in [place] and works in Architect. Son [name] lives at home and is disabled with a diagnosis of paranoid schizophrenia. The patient has no grandchildren. She is not a church attender    ADVANCED DIRECTIVES: Not in place   HEALTH MAINTENANCE: (Updated January 2015) History  Substance Use Topics  . Smoking status: Former Smoker    Quit date: 03/17/1985  . Smokeless tobacco: Never Used  . Alcohol Use: No     Colonoscopy: Never  PAP: Remote  Bone density: November 2014, showed osteoporosis   according to the patient's report  Lipid panel:  Not on file, Dr. Wilson Singer   Allergies  Allergen Reactions  . Codeine Itching and Nausea Only    REACTION: Nausea and itching  . Other     Close contact with Some recycled plastics cause whelps    Current Outpatient Prescriptions  Medication Sig Dispense Refill  .  amoxicillin-clavulanate (AUGMENTIN) 875-125 MG per tablet Take 1 tablet by mouth 2 (two) times daily.  10 tablet  0  . atorvastatin (LIPITOR) 20 MG tablet Take 20 mg by mouth every evening.       . Cholecalciferol (VITAMIN D) 2000 UNITS CAPS Take 6,000 Units by mouth every morning.      . cholestyramine (QUESTRAN) 4 G packet Take 1 packet (4 g total) by mouth 2 (two) times daily as needed (diarrhea). Take with meals.  30 each  1  . dexamethasone (DECADRON) 4 MG tablet Take 2 tablets by mouth once a day on the day after chemotherapy and then take 2 tablets two times a day for 2 days. Take with food.  30 tablet  1  . lidocaine-prilocaine (EMLA) cream Apply 1 application topically as needed.  30 g  0  . lisinopril (PRINIVIL,ZESTRIL)  20 MG tablet Take 20 mg by mouth every morning.       Marland Kitchen LORazepam (ATIVAN) 0.5 MG tablet Take 0.5 mg by mouth every 8 (eight) hours as needed for anxiety (or nausea).       . meloxicam (MOBIC) 15 MG tablet Take 15 mg by mouth every morning.       Marland Kitchen omeprazole (PRILOSEC) 40 MG capsule Take 1 capsule (40 mg total) by mouth daily.  30 capsule  4  . ondansetron (ZOFRAN) 8 MG tablet Take 1 tablet (8 mg total) by mouth 2 (two) times daily as needed. Take two times a day as needed for nausea or vomiting starting on the third day after chemotherapy.  30 tablet  1  . prochlorperazine (COMPAZINE) 10 MG tablet Take 1 tablet (10 mg total) by mouth every 6 (six) hours as needed (Nausea or vomiting).  30 tablet  1  . sertraline (ZOLOFT) 100 MG tablet Take 1 tablet (100 mg total) by mouth at bedtime.  30 tablet  1  . thyroid (ARMOUR) 90 MG tablet Take 90 mg by mouth every morning.      . traMADol (ULTRAM) 50 MG tablet Take 1-2 tablets (50-100 mg total) by mouth 3 (three) times daily as needed for moderate pain or severe pain.  100 tablet  0  . traZODone (DESYREL) 50 MG tablet Take 1 tablet (50 mg total) by mouth at bedtime as needed for sleep.  30 tablet  1  . venlafaxine (EFFEXOR) 37.5 MG  tablet Take 1 po at bedtime  30 tablet  1   No current facility-administered medications for this visit.    OBJECTIVE: Middle-aged white woman who appears stated age 57 Vitals:   05/19/13 0856  BP: 118/75  Pulse: 121  Temp: 98.4 F (36.9 C)  Resp: 18     Body mass index is 40.67 kg/(m^2).    ECOG FS: 1 Filed Weights   05/19/13 0856  Weight: 222 lb 6.4 oz (100.88 kg)   Sclerae unicteric, pupils equal and reactive Oropharynx clear and moist-- no thrush No cervical or supraclavicular adenopathy Lungs no rales or rhonchi Heart regular rate and rhythm Abd soft, nontender, positive bowel sounds MSK no focal spinal tenderness, no upper extremity lymphedema Neuro: nonfocal, well oriented, appropriate affect Breasts: I no longer palpate a well-defined mass in the right breast. There is no skin or nipple change of concern. The right axilla is benign. The left breast is unremarkable  LAB RESULTS:  Lab Results  Component Value Date   WBC 6.0 05/19/2013   NEUTROABS 4.6 05/19/2013   HGB 10.5* 05/19/2013   HCT 31.6* 05/19/2013   MCV 87.8 05/19/2013   PLT 344 05/19/2013      Chemistry      Component Value Date/Time   NA 141 05/17/2013 1156   NA 141 08/16/2007 0833   K 4.6 05/17/2013 1156   K 4.5 08/16/2007 0833   CL 105 08/16/2007 0833   CO2 25 05/17/2013 1156   CO2 27 08/16/2007 0833   BUN 17.2 05/17/2013 1156   BUN 14 08/16/2007 0833   CREATININE 1.1 05/17/2013 1156   CREATININE 0.9 08/16/2007 0833      Component Value Date/Time   CALCIUM 10.2 05/17/2013 1156   CALCIUM 9.7 08/16/2007 0833   ALKPHOS 82 05/17/2013 1156   ALKPHOS 64 08/16/2007 0833   AST 38* 05/17/2013 1156   AST 25 08/16/2007 0833   ALT 51 05/17/2013 1156   ALT 30 08/16/2007 6629  BILITOT 0.51 05/17/2013 1156   BILITOT 1.0 08/16/2007 9201       STUDIES: Dg Chest 2 View  05/19/2013   CLINICAL DATA:  Shortness of breath; history of breast carcinoma  EXAM: CHEST  2 VIEW  COMPARISON:  March 23, 2013  FINDINGS: Central catheter tip is in the  superior vena cava near the junction with the right atrium. No pneumothorax. There is a calcified granuloma in the left lower lobe. Elsewhere, lungs are clear. Heart size and pulmonary vascularity are normal. No adenopathy. No bone lesions. There is mild degenerative change in the thoracic spine.  IMPRESSION: No edema or consolidation. Granuloma left base. Port-A-Cath as described without pneumothorax.   Electronically Signed   By: Lowella Grip M.D.   On: 05/19/2013 10:35   Mr Breast Bilateral W Wo Contrast  05/17/2013   CLINICAL DATA:  Assess response to neoadjuvant chemotherapy.  EXAM: BILATERAL BREAST MRI WITH AND WITHOUT CONTRAST  TECHNIQUE: Multiplanar, multisequence MR images of both breasts were obtained prior to and following the intravenous administration of 67m of MultiHance  THREE-DIMENSIONAL MR IMAGE RENDERING ON INDEPENDENT WORKSTATION:  Three-dimensional MR images were rendered by post-processing of the original MR data on an independent workstation. The three-dimensional MR images were interpreted, and findings are reported in the following complete MRI report for this study. Three dimensional images were evaluated at the independent DynaCad workstation  COMPARISON:  Previous breast MRI dated 03/08/2013.  FINDINGS: Breast composition:  Density b:  Scattered fibroglandular.  Background parenchymal enhancement: Mild  Right breast: There has been significant interval decrease in the size and enhancement of the previously described irregular enhancing mass located within the upper outer quadrant of the right breast. Only a small amount of non mass enhancement remains in the region. A measurable discrete mass is no longer present. There are no new findings within the right breast.  Left breast: No mass or abnormal enhancement.  Lymph nodes: No abnormal appearing lymph nodes.  Ancillary findings:  None.  IMPRESSION: Marked decrease in size of the a pre-existing irregularly enhancing mass located  within the upper outer quadrant of the right breast as discussed above. No other significant change.  RECOMMENDATION: Treatment plan.  BI-RADS CATEGORY  6: Known biopsy-proven malignancy - appropriate action should be taken.   Electronically Signed   By: RLuberta RobertsonM.D.   On: 05/17/2013 14:03      ASSESSMENT: 58y.o. 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  (2)  being treated in the neoadjuvant setting, the plan being to complete 4 dose dense cycles of doxorubicin/cyclophosphamide, first dose on 03/31/2013. Cycle 4 was delayed one week because of bad weather. She will be given Neulasta on day 2 for granulocyte support. This regimen will be followed by 12 weekly doses of carboplatin/paclitaxel prior to definitive surgery.   (3)  genetics counseling pending  PLAN: [name]'s repeat breast MRIs very favorable.  I do not think the symptoms she experienced over the last week and a half are related to her chemotherapy directly. She appears to have had a mild upper respiratory infection. That is clearing nicely. However I am a little concerned that she may have had pneumonia with her upper respiratory symptoms and we will obtain a chest x-ray today before treatment. If clear, we will proceed to her fourth and final cycle of doxorubicin and cyclophosphamide.  As far as her "near fainting" episode, most likely she was dehydrated. A chemotherapy candidate Loretta patient's blood pressure, and I am stopping her lisinopril. We will keep an eye on her blood pressure at future visits.  Once she recovers from this treatment, she will start her weekly carboplatin/paclitaxel doses. Today we discussed the possible toxicities, side effects and complications of these treatments which hopefully will be milder as far as her symptoms are concerned. She understands one concern  regarding treatment with Taxol is peripheral neuropathy, which may not develop at all, may develop and been mild and reversible, or may be permanent. We will watch him closely regarding that specific symptom among others  [name] has a good understanding of the overall plan. She agrees with it. She will call with any problems that may develop before her next visit here.   Chauncey Cruel, MD   05/19/2013 9:48 AM

## 2013-05-20 ENCOUNTER — Telehealth: Payer: Self-pay | Admitting: Oncology

## 2013-05-20 ENCOUNTER — Ambulatory Visit (HOSPITAL_BASED_OUTPATIENT_CLINIC_OR_DEPARTMENT_OTHER): Payer: BC Managed Care – PPO

## 2013-05-20 VITALS — BP 115/55 | HR 98 | Temp 98.4°F

## 2013-05-20 DIAGNOSIS — C50411 Malignant neoplasm of upper-outer quadrant of right female breast: Secondary | ICD-10-CM

## 2013-05-20 DIAGNOSIS — C50419 Malignant neoplasm of upper-outer quadrant of unspecified female breast: Secondary | ICD-10-CM

## 2013-05-20 DIAGNOSIS — Z5189 Encounter for other specified aftercare: Secondary | ICD-10-CM

## 2013-05-20 MED ORDER — PEGFILGRASTIM INJECTION 6 MG/0.6ML
6.0000 mg | Freq: Once | SUBCUTANEOUS | Status: AC
  Administered 2013-05-20: 6 mg via SUBCUTANEOUS
  Filled 2013-05-20: qty 0.6

## 2013-05-20 NOTE — Telephone Encounter (Signed)
s.w. pt and advised on cx appt....pt ok and aware

## 2013-05-26 ENCOUNTER — Ambulatory Visit (HOSPITAL_BASED_OUTPATIENT_CLINIC_OR_DEPARTMENT_OTHER): Payer: BC Managed Care – PPO | Admitting: Hematology and Oncology

## 2013-05-26 ENCOUNTER — Ambulatory Visit: Payer: BC Managed Care – PPO

## 2013-05-26 ENCOUNTER — Inpatient Hospital Stay (HOSPITAL_COMMUNITY): Payer: BC Managed Care – PPO

## 2013-05-26 ENCOUNTER — Ambulatory Visit (HOSPITAL_BASED_OUTPATIENT_CLINIC_OR_DEPARTMENT_OTHER): Payer: BC Managed Care – PPO

## 2013-05-26 ENCOUNTER — Other Ambulatory Visit: Payer: Self-pay | Admitting: *Deleted

## 2013-05-26 ENCOUNTER — Inpatient Hospital Stay (HOSPITAL_COMMUNITY)
Admission: AD | Admit: 2013-05-26 | Discharge: 2013-05-27 | DRG: 809 | Disposition: A | Discharge status: Home / Self Care | Payer: BC Managed Care – PPO | Source: Ambulatory Visit | Attending: Internal Medicine | Admitting: Internal Medicine

## 2013-05-26 ENCOUNTER — Other Ambulatory Visit: Payer: BC Managed Care – PPO

## 2013-05-26 ENCOUNTER — Encounter (HOSPITAL_COMMUNITY): Payer: Self-pay | Admitting: Internal Medicine

## 2013-05-26 VITALS — BP 129/82 | HR 112 | Temp 98.8°F | Resp 20 | Ht 62.0 in | Wt 222.8 lb

## 2013-05-26 VITALS — Temp 98.9°F

## 2013-05-26 DIAGNOSIS — C50411 Malignant neoplasm of upper-outer quadrant of right female breast: Secondary | ICD-10-CM

## 2013-05-26 DIAGNOSIS — T451X5A Adverse effect of antineoplastic and immunosuppressive drugs, initial encounter: Secondary | ICD-10-CM

## 2013-05-26 DIAGNOSIS — D701 Agranulocytosis secondary to cancer chemotherapy: Secondary | ICD-10-CM

## 2013-05-26 DIAGNOSIS — Z87442 Personal history of urinary calculi: Secondary | ICD-10-CM

## 2013-05-26 DIAGNOSIS — D709 Neutropenia, unspecified: Secondary | ICD-10-CM | POA: Diagnosis present

## 2013-05-26 DIAGNOSIS — R509 Fever, unspecified: Secondary | ICD-10-CM

## 2013-05-26 DIAGNOSIS — D702 Other drug-induced agranulocytosis: Secondary | ICD-10-CM

## 2013-05-26 DIAGNOSIS — C50419 Malignant neoplasm of upper-outer quadrant of unspecified female breast: Secondary | ICD-10-CM

## 2013-05-26 DIAGNOSIS — E785 Hyperlipidemia, unspecified: Secondary | ICD-10-CM | POA: Diagnosis present

## 2013-05-26 DIAGNOSIS — I10 Essential (primary) hypertension: Secondary | ICD-10-CM

## 2013-05-26 DIAGNOSIS — E039 Hypothyroidism, unspecified: Secondary | ICD-10-CM

## 2013-05-26 DIAGNOSIS — F3289 Other specified depressive episodes: Secondary | ICD-10-CM | POA: Diagnosis present

## 2013-05-26 DIAGNOSIS — F419 Anxiety disorder, unspecified: Secondary | ICD-10-CM

## 2013-05-26 DIAGNOSIS — K219 Gastro-esophageal reflux disease without esophagitis: Secondary | ICD-10-CM | POA: Diagnosis present

## 2013-05-26 DIAGNOSIS — M810 Age-related osteoporosis without current pathological fracture: Secondary | ICD-10-CM | POA: Diagnosis present

## 2013-05-26 DIAGNOSIS — N200 Calculus of kidney: Secondary | ICD-10-CM | POA: Diagnosis present

## 2013-05-26 DIAGNOSIS — Z171 Estrogen receptor negative status [ER-]: Secondary | ICD-10-CM | POA: Diagnosis present

## 2013-05-26 DIAGNOSIS — Z87891 Personal history of nicotine dependence: Secondary | ICD-10-CM

## 2013-05-26 DIAGNOSIS — R109 Unspecified abdominal pain: Secondary | ICD-10-CM

## 2013-05-26 DIAGNOSIS — E063 Autoimmune thyroiditis: Secondary | ICD-10-CM | POA: Diagnosis present

## 2013-05-26 DIAGNOSIS — R5381 Other malaise: Secondary | ICD-10-CM | POA: Diagnosis present

## 2013-05-26 DIAGNOSIS — R112 Nausea with vomiting, unspecified: Secondary | ICD-10-CM | POA: Diagnosis present

## 2013-05-26 DIAGNOSIS — Z5189 Encounter for other specified aftercare: Secondary | ICD-10-CM

## 2013-05-26 DIAGNOSIS — Z823 Family history of stroke: Secondary | ICD-10-CM

## 2013-05-26 DIAGNOSIS — R5081 Fever presenting with conditions classified elsewhere: Secondary | ICD-10-CM | POA: Diagnosis present

## 2013-05-26 DIAGNOSIS — F329 Major depressive disorder, single episode, unspecified: Secondary | ICD-10-CM | POA: Diagnosis present

## 2013-05-26 DIAGNOSIS — Z6841 Body Mass Index (BMI) 40.0 and over, adult: Secondary | ICD-10-CM

## 2013-05-26 DIAGNOSIS — R300 Dysuria: Secondary | ICD-10-CM | POA: Diagnosis present

## 2013-05-26 DIAGNOSIS — R5383 Other fatigue: Secondary | ICD-10-CM

## 2013-05-26 DIAGNOSIS — Z8249 Family history of ischemic heart disease and other diseases of the circulatory system: Secondary | ICD-10-CM

## 2013-05-26 DIAGNOSIS — E038 Other specified hypothyroidism: Secondary | ICD-10-CM

## 2013-05-26 LAB — CBC WITH DIFFERENTIAL/PLATELET
BASO%: 3.7 % — ABNORMAL HIGH (ref 0.0–2.0)
BASOS ABS: 0 10*3/uL (ref 0.0–0.1)
EOS ABS: 0 10*3/uL (ref 0.0–0.5)
EOS%: 14.8 % — ABNORMAL HIGH (ref 0.0–7.0)
HEMATOCRIT: 29.3 % — AB (ref 34.8–46.6)
HGB: 9.9 g/dL — ABNORMAL LOW (ref 11.6–15.9)
LYMPH%: 63 % — AB (ref 14.0–49.7)
MCH: 29.2 pg (ref 25.1–34.0)
MCHC: 33.8 g/dL (ref 31.5–36.0)
MCV: 86.4 fL (ref 79.5–101.0)
MONO#: 0 10*3/uL — ABNORMAL LOW (ref 0.1–0.9)
MONO%: 3.7 % (ref 0.0–14.0)
NEUT%: 14.8 % — AB (ref 38.4–76.8)
NEUTROS ABS: 0 10*3/uL — AB (ref 1.5–6.5)
NRBC: 0 % (ref 0–0)
PLATELETS: 131 10*3/uL — AB (ref 145–400)
RBC: 3.39 10*6/uL — ABNORMAL LOW (ref 3.70–5.45)
RDW: 14.6 % — ABNORMAL HIGH (ref 11.2–14.5)
WBC: 0.3 10*3/uL — CL (ref 3.9–10.3)
lymph#: 0.2 10*3/uL — ABNORMAL LOW (ref 0.9–3.3)

## 2013-05-26 LAB — COMPREHENSIVE METABOLIC PANEL (CC13)
ALBUMIN: 3.5 g/dL (ref 3.5–5.0)
ALK PHOS: 87 U/L (ref 40–150)
ALT: 15 U/L (ref 0–55)
AST: 8 U/L (ref 5–34)
Anion Gap: 14 mEq/L — ABNORMAL HIGH (ref 3–11)
BILIRUBIN TOTAL: 1.02 mg/dL (ref 0.20–1.20)
BUN: 17.8 mg/dL (ref 7.0–26.0)
CO2: 26 mEq/L (ref 22–29)
Calcium: 10 mg/dL (ref 8.4–10.4)
Chloride: 103 mEq/L (ref 98–109)
Creatinine: 0.9 mg/dL (ref 0.6–1.1)
Glucose: 86 mg/dl (ref 70–140)
POTASSIUM: 4.7 meq/L (ref 3.5–5.1)
Sodium: 143 mEq/L (ref 136–145)
Total Protein: 6.7 g/dL (ref 6.4–8.3)

## 2013-05-26 LAB — URINALYSIS, MICROSCOPIC - CHCC
Bilirubin (Urine): NEGATIVE
Glucose: NEGATIVE mg/dL
KETONES: NEGATIVE mg/dL
Leukocyte Esterase: NEGATIVE
NITRITE: NEGATIVE
PROTEIN: NEGATIVE mg/dL
Specific Gravity, Urine: 1.005 (ref 1.003–1.035)
Urobilinogen, UR: 0.2 mg/dL (ref 0.2–1)
pH: 6 (ref 4.6–8.0)

## 2013-05-26 LAB — MAGNESIUM (CC13): Magnesium: 1.7 mg/dl (ref 1.5–2.5)

## 2013-05-26 MED ORDER — ONDANSETRON HCL 4 MG PO TABS: 4.0000 mg | ORAL_TABLET | Freq: Four times a day (QID) | ORAL | Status: DC | PRN

## 2013-05-26 MED ORDER — TRAMADOL HCL 50 MG PO TABS: 50.0000 mg | ORAL_TABLET | Freq: Three times a day (TID) | ORAL | Status: DC | PRN

## 2013-05-26 MED ORDER — ACETAMINOPHEN 325 MG PO TABS
650.0000 mg | ORAL_TABLET | Freq: Four times a day (QID) | ORAL | Status: DC | PRN
  Administered 2013-05-26 (×2): 650 mg via ORAL
  Filled 2013-05-26 (×2): qty 2

## 2013-05-26 MED ORDER — ENOXAPARIN SODIUM 40 MG/0.4ML ~~LOC~~ SOLN: 40.0000 mg | SUBCUTANEOUS | Status: DC

## 2013-05-26 MED ORDER — ONDANSETRON HCL 4 MG/2ML IJ SOLN: 4.0000 mg | Freq: Four times a day (QID) | INTRAMUSCULAR | Status: DC | PRN

## 2013-05-26 MED ORDER — SODIUM CHLORIDE 0.9 % IJ SOLN
10.0000 mL | INTRAMUSCULAR | Status: DC | PRN
  Administered 2013-05-26: 10 mL via INTRAVENOUS
  Filled 2013-05-26: qty 10

## 2013-05-26 MED ORDER — LISINOPRIL 20 MG PO TABS: 20.0000 mg | ORAL_TABLET | Freq: Every morning | ORAL | Status: DC

## 2013-05-26 MED ORDER — TRAZODONE HCL 50 MG PO TABS
50.0000 mg | ORAL_TABLET | Freq: Every evening | ORAL | Status: DC | PRN
  Filled 2013-05-26: qty 1

## 2013-05-26 MED ORDER — SODIUM CHLORIDE 0.45 % IV SOLN
Freq: Once | INTRAVENOUS | Status: AC
  Administered 2013-05-26: 10:00:00 via INTRAVENOUS
  Filled 2013-05-26: qty 1000

## 2013-05-26 MED ORDER — PANTOPRAZOLE SODIUM 40 MG PO TBEC
40.0000 mg | DELAYED_RELEASE_TABLET | Freq: Every day | ORAL | Status: DC
  Administered 2013-05-27: 40 mg via ORAL
  Filled 2013-05-26: qty 1

## 2013-05-26 MED ORDER — LORAZEPAM 0.5 MG PO TABS
0.5000 mg | ORAL_TABLET | Freq: Three times a day (TID) | ORAL | Status: DC | PRN
  Administered 2013-05-26 – 2013-05-27 (×2): 0.5 mg via ORAL
  Filled 2013-05-26 (×2): qty 1

## 2013-05-26 MED ORDER — CHOLESTYRAMINE 4 G PO PACK
4.0000 g | PACK | Freq: Two times a day (BID) | ORAL | Status: DC | PRN
  Filled 2013-05-26: qty 1

## 2013-05-26 MED ORDER — ALBUTEROL SULFATE (2.5 MG/3ML) 0.083% IN NEBU: 2.5000 mg | INHALATION_SOLUTION | RESPIRATORY_TRACT | Status: DC | PRN

## 2013-05-26 MED ORDER — ACETAMINOPHEN 650 MG RE SUPP: 650.0000 mg | Freq: Four times a day (QID) | RECTAL | Status: DC | PRN

## 2013-05-26 MED ORDER — VANCOMYCIN HCL 10 G IV SOLR
1250.0000 mg | INTRAVENOUS | Status: AC
  Administered 2013-05-26: 1250 mg via INTRAVENOUS
  Filled 2013-05-26: qty 1250

## 2013-05-26 MED ORDER — SODIUM CHLORIDE 0.9 % IV SOLN
INTRAVENOUS | Status: DC
  Administered 2013-05-26: 22:00:00 via INTRAVENOUS

## 2013-05-26 MED ORDER — ONDANSETRON 8 MG/NS 50 ML IVPB
INTRAVENOUS | Status: AC
  Filled 2013-05-26: qty 8

## 2013-05-26 MED ORDER — ONDANSETRON 8 MG/50ML IVPB (CHCC)
8.0000 mg | Freq: Once | INTRAVENOUS | Status: AC
  Administered 2013-05-26: 8 mg via INTRAVENOUS

## 2013-05-26 MED ORDER — DEXTROSE 5 % IV SOLN
2.0000 g | Freq: Once | INTRAVENOUS | Status: AC
  Administered 2013-05-26: 2 g via INTRAVENOUS
  Filled 2013-05-26: qty 2

## 2013-05-26 MED ORDER — ATORVASTATIN CALCIUM 20 MG PO TABS
20.0000 mg | ORAL_TABLET | Freq: Every evening | ORAL | Status: DC
  Filled 2013-05-26 (×2): qty 1

## 2013-05-26 MED ORDER — SODIUM CHLORIDE 0.9 % IV SOLN
1250.0000 mg | Freq: Two times a day (BID) | INTRAVENOUS | Status: DC
  Administered 2013-05-27: 1250 mg via INTRAVENOUS
  Filled 2013-05-26 (×2): qty 1250

## 2013-05-26 MED ORDER — THYROID 60 MG PO TABS
90.0000 mg | ORAL_TABLET | Freq: Every day | ORAL | Status: DC
  Administered 2013-05-27: 90 mg via ORAL
  Filled 2013-05-26 (×2): qty 1

## 2013-05-26 MED ORDER — DEXTROSE 5 % IV SOLN
2.0000 g | Freq: Three times a day (TID) | INTRAVENOUS | Status: DC
  Administered 2013-05-27 (×2): 2 g via INTRAVENOUS
  Filled 2013-05-26 (×4): qty 2

## 2013-05-26 MED ORDER — SERTRALINE HCL 100 MG PO TABS
100.0000 mg | ORAL_TABLET | Freq: Every day | ORAL | Status: DC
  Filled 2013-05-26 (×2): qty 1

## 2013-05-26 MED ORDER — DEXTROSE 5 % IV SOLN
2.0000 g | INTRAVENOUS | Status: AC
  Administered 2013-05-26: 2 g via INTRAVENOUS
  Filled 2013-05-26: qty 2

## 2013-05-26 NOTE — Patient Instructions (Signed)
Dehydration, Adult  Dehydration means your body does not have as much fluid as it needs. Your kidneys, brain, and heart will not work properly without the right amount of fluids and salt.   HOME CARE   Ask your doctor how to replace body fluid losses (rehydrate).   Drink enough fluids to keep your pee (urine) clear or pale yellow.   Drink small amounts of fluids often if you feel sick to your stomach (nauseous) or throw up (vomit).   Eat like you normally do.   Avoid:   Foods or drinks high in sugar.   Bubbly (carbonated) drinks.   Juice.   Very hot or cold fluids.   Drinks with caffeine.   Fatty, greasy foods.   Alcohol.   Tobacco.   Eating too much.   Gelatin desserts.   Wash your hands to avoid spreading germs (bacteria, viruses).   Only take medicine as told by your doctor.   Keep all doctor visits as told.  GET HELP RIGHT AWAY IF:    You cannot drink something without throwing up.   You get worse even with treatment.   Your vomit has blood in it or looks greenish.   Your poop (stool) has blood in it or looks black and tarry.   You have not peed in 6 to 8 hours.   You pee a small amount of very dark pee.   You have a fever.   You pass out (faint).   You have belly (abdominal) pain that gets worse or stays in one spot (localizes).   You have a rash, stiff neck, or bad headache.   You get easily annoyed, sleepy, or are hard to wake up.   You feel weak, dizzy, or very thirsty.  MAKE SURE YOU:    Understand these instructions.   Will watch your condition.   Will get help right away if you are not doing well or get worse.  Document Released: 12/28/2008 Document Revised: 05/26/2011 Document Reviewed: 10/21/2010  Exit[employer] Patient Information 2014 [employer].

## 2013-05-26 NOTE — Progress Notes (Signed)
ANTIBIOTIC CONSULT NOTE - INITIAL  Pharmacy Consult for:  Cefepime and Vancomycin Indication:   Chemotherapy-related febrile neutropenia   Allergies  Allergen Reactions  . Codeine Itching and Nausea Only    REACTION: Nausea and itching  . Compazine [Prochlorperazine Edisylate] Other (See Comments)    Head aches   . Other     Close contact with Some recycled plastics cause whelps    Patient Measurements: Height: 5\' 2"  (157.5 cm) Weight: 222 lb 12.8 oz (101.061 kg) IBW/kg (Calculated) : 50.1   Vital Signs: Temp: 98.9 F (37.2 C) (03/12 1053) Temp src: Oral (03/12 1053) BP: 129/82 mmHg (03/12 0912) Pulse Rate: 112 (03/12 0912)  Labs:  Recent Labs  05/26/13 0939 05/26/13 0939  WBC 0.3*  --   HGB 9.9*  --   PLT 131*  --   CREATININE  --  0.9   Estimated Creatinine Clearance: 74.9 ml/min (by C-G formula based on Cr of 0.9).    Microbiology: Blood and urine cultures are pending.  Medical History: Past Medical History  Diagnosis Date  . Hypertension   . Hyperlipidemia   . Thyroid disease   . Osteoporosis   . Hypothyroidism     hashimoto's  . Depression   . Pneumonia     2014  . Cold (disease) 03/21/12    couple of weeks ago - pt states much better now  . History of kidney stones     "passed 9 stones"  . GERD (gastroesophageal reflux disease)     no meds except occas tums  . Headache(784.0)     occas migraine  . Cancer     new dx of right breast cancer - planning chemo first  . Arthritis     oa; lower back pain - hx of injury yrs ago-occas flare ups  . Anemia     2005 - related to heavy menses with the thyroid probl    Medications:  Scheduled:  . atorvastatin  20 mg Oral QPM  . ceFEPime (MAXIPIME) IV  2 g Intravenous STAT  . [START ON 05/27/2013] ceFEPime (MAXIPIME) IV  2 g Intravenous Q8H  . [START ON 05/27/2013] pantoprazole  40 mg Oral Daily  . sertraline  100 mg Oral QHS  . [START ON 05/27/2013] thyroid  90 mg Oral q morning - 10a  . vancomycin   1,250 mg Intravenous STAT  . [START ON 05/27/2013] vancomycin  1,250 mg Intravenous Q12H   Assessment:  Asked to assist with antibiotic therapy -- Cefepime and Vancomycin -- for this 58 year-old female with febrile neutropenia.  The last chemotherapy treatment was on 05/19/13, followed by Neulasta on 05/20/13.  Goals of Therapy:   Vancomycin trough levels 15-20 mcg/ml  Eradication of infection   Plan:   Vancomycin 1250 mg IV every 12 hours, with levels as needed to guide dosing.  Cefepime 2 grams IV every 8 hours.  Follow for culture results.  LuskPh. 05/26/2013 3:47 PM

## 2013-05-26 NOTE — H&P (Signed)
[facility] History and Physical  Sydney Ford [id number] DOB: 04/11/1955 DOA: 05/26/2013   PCP: Dwan Bolt, MD  Specialists: Dr. Jana Hakim is her oncologist  Chief Complaint: Fever, and low blood counts  HPI: Sydney Ford is a 58 y.o. female with a past medical history of Hashimoto's thyroiditis, recently diagnosed breast cancer getting neoadjuvant chemotherapy with 4 cycles of doxorubicin/cyclophosphamide. First dose was on 03/31/2013. This will be followed by 12 weekly doses of carboplatin and paclitaxel prior to definitive surgery. Patient to receive her chemotherapy on March 5. And she received Neulasta on March 6. Typically after she receives Neulasta she gets pain in her shoulders, neck, upper back. This was the same after her last injection. On Monday she was feeling better and on Tuesday she had nausea and has had poor appetite. She had pain in the left upper side of her abdomen, but that has since resolved. She's had a cough, but nothing unusual. She's had a fever up to 100F. No chills. She has been feeling fatigued. Has been retching a lot. She notices some discomfort with urination, but denies any pain per se. No blood in the urine. She does have a history of kidney stones. Denies any leg swelling. She had felt the lightheaded few days ago, but none currently. Denies any vaginal discharge or bleeding.  Home Medications: Prior to Admission medications   Medication Sig Start Date End Date Taking? Authorizing Provider  amoxicillin-clavulanate (AUGMENTIN) 875-125 MG per tablet Take 1 tablet by mouth 2 (two) times daily. 05/13/13   Chauncey Cruel, MD  atorvastatin (LIPITOR) 20 MG tablet Take 20 mg by mouth every evening.  01/15/13   Historical Provider, MD  Cholecalciferol (VITAMIN D) 2000 UNITS CAPS Take 6,000 Units by mouth every morning.    Historical Provider, MD  cholestyramine Lucrezia Starch) 4 G packet Take 1 packet (4 g total) by mouth 2 (two) times daily as  needed (diarrhea). Take with meals. 04/21/13   Amy Milda Smart, PA-C  ciprofloxacin (CIPRO) 500 MG tablet  05/12/13   Historical Provider, MD  dexamethasone (DECADRON) 4 MG tablet  04/12/13   Historical Provider, MD  lidocaine-prilocaine (EMLA) cream Apply 1 application topically as needed. 03/15/13   Chauncey Cruel, MD  lisinopril (PRINIVIL,ZESTRIL) 20 MG tablet Take 20 mg by mouth every morning.  02/17/13   Historical Provider, MD  LORazepam (ATIVAN) 0.5 MG tablet Take 0.5 mg by mouth every 8 (eight) hours as needed for anxiety (or nausea).  12/25/12   Historical Provider, MD  meloxicam (MOBIC) 15 MG tablet Take 15 mg by mouth every morning.  02/21/13   Historical Provider, MD  omeprazole (PRILOSEC) 40 MG capsule Take 1 capsule (40 mg total) by mouth daily. 03/31/13   Amy Milda Smart, PA-C  ondansetron (ZOFRAN) 8 MG tablet  03/18/13   Historical Provider, MD  prochlorperazine (COMPAZINE) 10 MG tablet  04/25/13   Historical Provider, MD  sertraline (ZOLOFT) 100 MG tablet Take 1 tablet (100 mg total) by mouth at bedtime. 04/06/13   Amy Milda Smart, PA-C  thyroid (ARMOUR) 90 MG tablet Take 90 mg by mouth every morning.    Historical Provider, MD  traMADol (ULTRAM) 50 MG tablet Take 1-2 tablets (50-100 mg total) by mouth 3 (three) times daily as needed for moderate pain or severe pain. 04/13/13   Amy Milda Smart, PA-C  traZODone (DESYREL) 50 MG tablet Take 1 tablet (50 mg total) by mouth at bedtime as needed for sleep. 04/28/13   Amy G  Gwenlyn Found, PA-C  venlafaxine (EFFEXOR) 37.5 MG tablet Take 1 po at bedtime 05/09/13   Theotis Burrow, PA-C    Allergies:  Allergies  Allergen Reactions  . Codeine Itching and Nausea Only    REACTION: Nausea and itching  . Other     Close contact with Some recycled plastics cause whelps    Past Medical History: Past Medical History  Diagnosis Date  . Hypertension   . Hyperlipidemia   . Thyroid disease   . Osteoporosis   . Hypothyroidism     hashimoto's  . Depression   . Pneumonia      2014  . Cold (disease) 03/21/12    couple of weeks ago - pt states much better now  . History of kidney stones     "passed 9 stones"  . GERD (gastroesophageal reflux disease)     no meds except occas tums  . Headache(784.0)     occas migraine  . Cancer     new dx of right breast cancer - planning chemo first  . Arthritis     oa; lower back pain - hx of injury yrs ago-occas flare ups  . Anemia     2005 - related to heavy menses with the thyroid probl    Past Surgical History  Procedure Laterality Date  . Cesarean section  1987  . Tonsillectomy    . Uterine ablation  2008    no menstrual periods since procedure  . Portacath placement N/A 03/23/2013    Procedure: INSERTION PORT-A-CATH;  Surgeon: Rolm Bookbinder, MD;  Location: [facility];  Service: General;  Laterality: N/A;    Social History: She lives with her husband. No smoking, alcohol or illicit drug use. Usually independent with daily activities.  Family History:  Family History  Problem Relation Age of Onset  . Heart failure Mother   . Heart attack Father   . Autoimmune disease Maternal Aunt     lupus, scelraderma, reynolds disease, and others  . Stroke Paternal Uncle   . Heart failure Maternal Grandmother   . Heart attack Maternal Grandfather   . Throat cancer Paternal Grandmother     dx late 51s; non smoker or drinker  . Heart attack Paternal Grandfather   . Schizophrenia Son 16  . Heart attack Paternal Uncle      Review of Systems - History obtained from the patient General ROS: positive for  - fatigue Psychological ROS: negative Ophthalmic ROS: negative ENT ROS: negative Allergy and Immunology ROS: negative Hematological and Lymphatic ROS: negative Endocrine ROS: negative Breast ROS: as in hpi Respiratory ROS: as in hpi Cardiovascular ROS: no chest pain or dyspnea on exertion Gastrointestinal ROS: as in hpi Genito-Urinary ROS: as in hpi Musculoskeletal ROS: negative Neurological ROS: no TIA or stroke  symptoms Dermatological ROS: negative  Physical Examination  Filed Vitals:   05/26/13 1500  Height: 5\' 2"  (1.575 m)  Weight: 101.061 kg (222 lb 12.8 oz)   T 98.9, 112bpm, 20, 129/82, 100% Ht 5\' 2"  (1.575 m)  Wt 101.061 kg (222 lb 12.8 oz)  BMI 40.74 kg/m2  General appearance: alert, cooperative, appears stated age, no distress and moderately obese Head: Normocephalic, without obvious abnormality, atraumatic Eyes: conjunctivae/corneas clear. PERRL, EOM's intact. Throat: lips, mucosa, and tongue normal; teeth and gums normal Neck: no adenopathy, no carotid bruit, no JVD, supple, symmetrical, trachea midline and thyroid not enlarged, symmetric, no tenderness/mass/nodules Resp: clear to auscultation bilaterally Cardio: regular rate and rhythm, S1, S2 normal, no murmur, click,  rub or gallop GI: soft, non-tender; bowel sounds normal; no masses,  no organomegaly Extremities: extremities normal, atraumatic, no cyanosis or edema Pulses: 2+ and symmetric Skin: Skin color, texture, turgor normal. No rashes or lesions Neurologic: No focal deficits  Laboratory Data: Results for orders placed in visit on 05/26/13 (from the past 48 hour(s))  MAGNESIUM (CC13)     Status: None   Collection Time    05/26/13  9:38 AM      Result Value Ref Range   Magnesium 1.7  1.5 - 2.5 mg/dl  CBC WITH DIFFERENTIAL     Status: Abnormal   Collection Time    05/26/13  9:39 AM      Result Value Ref Range   WBC 0.3 (*) 3.9 - 10.3 10e3/uL   NEUT# 0.0 (*) 1.5 - 6.5 10e3/uL   HGB 9.9 (*) 11.6 - 15.9 g/dL   HCT 29.3 (*) 34.8 - 46.6 %   Platelets 131 (*) 145 - 400 10e3/uL   MCV 86.4  79.5 - 101.0 fL   MCH 29.2  25.1 - 34.0 pg   MCHC 33.8  31.5 - 36.0 g/dL   RBC 3.39 (*) 3.70 - 5.45 10e6/uL   RDW 14.6 (*) 11.2 - 14.5 %   lymph# 0.2 (*) 0.9 - 3.3 10e3/uL   MONO# 0.0 (*) 0.1 - 0.9 10e3/uL   Eosinophils Absolute 0.0  0.0 - 0.5 10e3/uL   Basophils Absolute 0.0  0.0 - 0.1 10e3/uL   NEUT% 14.8 (*) 38.4 - 76.8 %    LYMPH% 63.0 (*) 14.0 - 49.7 %   MONO% 3.7  0.0 - 14.0 %   EOS% 14.8 (*) 0.0 - 7.0 %   BASO% 3.7 (*) 0.0 - 2.0 %   nRBC 0  0 - 0 %  COMPREHENSIVE METABOLIC PANEL (TD17)     Status: Abnormal   Collection Time    05/26/13  9:39 AM      Result Value Ref Range   Sodium 143  136 - 145 mEq/L   Potassium 4.7  3.5 - 5.1 mEq/L   Chloride 103  98 - 109 mEq/L   CO2 26  22 - 29 mEq/L   Glucose 86  70 - 140 mg/dl   BUN 17.8  7.0 - 26.0 mg/dL   Creatinine 0.9  0.6 - 1.1 mg/dL   Total Bilirubin 1.02  0.20 - 1.20 mg/dL   Alkaline Phosphatase 87  40 - 150 U/L   AST 8  5 - 34 U/L   ALT 15  0 - 55 U/L   Total Protein 6.7  6.4 - 8.3 g/dL   Albumin 3.5  3.5 - 5.0 g/dL   Calcium 10.0  8.4 - 10.4 mg/dL   Anion Gap 14 (*) 3 - 11 mEq/L  URINALYSIS, MICROSCOPIC - [facility]     Status: None   Collection Time    05/26/13 10:19 AM      Result Value Ref Range   Glucose Negative  Negative mg/dL   Comment: Note: New unit of measure   Bilirubin (Urine) Negative  Negative   Ketones Negative  Negative mg/dL   Specific Gravity, Urine 1.005  1.003 - 1.035   Blood Moderate  Negative   pH 6.0  4.6 - 8.0   Protein Negative  Negative- <30 mg/dL   Urobilinogen, UR 0.2  0.2 - 1 mg/dL   Nitrite Negative  Negative   Leukocyte Esterase Negative  Negative   RBC / HPF 3-6  0 -  2   WBC, UA 0-2  0 - 2   Bacteria, UA Few  Negative- Trace   Epithelial Cells Few  Negative- Few    Radiology Reports: No results found.  Problem List  Principal Problem:   Neutropenic fever Active Problems:   RENAL CALCULUS, RECURRENT   Breast cancer of upper-outer quadrant of right female breast   Chemotherapy induced neutropenia   Hypothyroidism due to Hashimoto's thyroiditis   Assessment: This is a 58 year old, Caucasian female, with recently diagnosed breast cancer involving the right breast. She is receiving neoadjuvant chemotherapy before definitive surgery. She presents with neutropenia and has had fever at home. No clear source  of infection is identified.  Plan: #1 chemotherapy induced neutropenia with fever: She received Neulasta on Friday. Place on broad-spectrum antibiotic coverage with vancomycin and cefepime. Blood cultures and UA have been obtained. UA does not show any infection per se. Chest x-ray will be obtained. Followup of neutrophil counts daily. Her oncologist is aware.   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 Plan is for definitive surgery at a later date. Oncology is aware of this hospitalization.  #3 history of Hashimoto's thyroiditis with hypothyroidism: Continue home medications.  #4 history of renal calculus: UA does show some blood. However, patient denies any pain at this time. Continue to monitor. If she develops flank pain or increasing discomfort with urination we may have to consider imaging studies.   DVT Prophylaxis: SCDs as the platelet count is noted to be much lower than her baseline Code Status: Full code Family Communication: Discussed with the patient  Disposition Plan: Admit to [facility]. She will likely return home in improved.   Further management decisions will depend on results of further testing and patient's response to treatment.  [facility]  [facility] Pager [phone]  If 7PM-7AM, please contact night-coverage [email or web address] Password [facility]  05/26/2013, 3:24 PM

## 2013-05-26 NOTE — Progress Notes (Signed)
1115-CBC results reviewed with Dr. Jana Hakim.  Orders received to obtain [facility] x 2 sets, one peripheral and one via port and then to start Rocephin 2 grams IV now.  1210-Pt to be admitted to hospital per Dr. Biagio Quint. Magrinat.  Pt notified and has no questions at this time.  Awaiting bed placement.  1400-Report called to Aldean Baker RN on 3-east. Pt to be admitted to room 1306. No change in pt.'s condition at this time.    1415-Pt assisted to room 1306 via w/c.

## 2013-05-27 ENCOUNTER — Telehealth: Payer: Self-pay | Admitting: Oncology

## 2013-05-27 ENCOUNTER — Other Ambulatory Visit: Payer: Self-pay | Admitting: Oncology

## 2013-05-27 DIAGNOSIS — Z171 Estrogen receptor negative status [ER-]: Secondary | ICD-10-CM

## 2013-05-27 DIAGNOSIS — C50919 Malignant neoplasm of unspecified site of unspecified female breast: Secondary | ICD-10-CM

## 2013-05-27 LAB — COMPREHENSIVE METABOLIC PANEL
ALK PHOS: 65 U/L (ref 39–117)
ALT: 11 U/L (ref 0–35)
AST: 7 U/L (ref 0–37)
Albumin: 2.9 g/dL — ABNORMAL LOW (ref 3.5–5.2)
BUN: 15 mg/dL (ref 6–23)
CO2: 28 mEq/L (ref 19–32)
Calcium: 8.8 mg/dL (ref 8.4–10.5)
Chloride: 104 mEq/L (ref 96–112)
Creatinine, Ser: 0.94 mg/dL (ref 0.50–1.10)
GFR calc non Af Amer: 65 mL/min — ABNORMAL LOW (ref >90)
GFR, EST AFRICAN AMERICAN: 75 mL/min — AB (ref >90)
Glucose, Bld: 89 mg/dL (ref 70–99)
POTASSIUM: 4.6 meq/L (ref 3.7–5.3)
Sodium: 142 mEq/L (ref 137–147)
Total Bilirubin: 0.5 mg/dL (ref 0.3–1.2)
Total Protein: 5.5 g/dL — ABNORMAL LOW (ref 6.0–8.3)

## 2013-05-27 LAB — CBC WITH DIFFERENTIAL/PLATELET
Basophils Absolute: 0 10*3/uL (ref 0.0–0.1)
Basophils Relative: 9 % — ABNORMAL HIGH (ref 0–1)
EOS ABS: 0 10*3/uL (ref 0.0–0.7)
Eosinophils Relative: 22 % — ABNORMAL HIGH (ref 0–5)
HEMATOCRIT: 23.6 % — AB (ref 36.0–46.0)
Hemoglobin: 8 g/dL — ABNORMAL LOW (ref 12.0–15.0)
Lymphocytes Relative: 56 % — ABNORMAL HIGH (ref 12–46)
Lymphs Abs: 0.2 10*3/uL — ABNORMAL LOW (ref 0.7–4.0)
MCH: 29.5 pg (ref 26.0–34.0)
MCHC: 33.9 g/dL (ref 30.0–36.0)
MCV: 87.1 fL (ref 78.0–100.0)
MONOS PCT: 9 % (ref 3–12)
Monocytes Absolute: 0 10*3/uL — ABNORMAL LOW (ref 0.1–1.0)
NEUTROS ABS: 0 10*3/uL — AB (ref 1.7–7.7)
Neutrophils Relative %: 4 % — ABNORMAL LOW (ref 43–77)
Platelets: 79 10*3/uL — ABNORMAL LOW (ref 150–400)
RBC: 2.71 MIL/uL — ABNORMAL LOW (ref 3.87–5.11)
RDW: 14.3 % (ref 11.5–15.5)
WBC: 0.2 10*3/uL — CL (ref 4.0–10.5)

## 2013-05-27 LAB — URINE CULTURE

## 2013-05-27 MED ORDER — HEPARIN SOD (PORK) LOCK FLUSH 100 UNIT/ML IV SOLN
500.0000 [IU] | Freq: Once | INTRAVENOUS | Status: DC
  Filled 2013-05-27: qty 5

## 2013-05-27 MED ORDER — AMOXICILLIN-POT CLAVULANATE 875-125 MG PO TABS: 1.0000 | ORAL_TABLET | Freq: Two times a day (BID) | ORAL | Status: DC

## 2013-05-27 MED ORDER — CIPROFLOXACIN HCL 500 MG PO TABS: 500.0000 mg | ORAL_TABLET | Freq: Two times a day (BID) | ORAL | Status: DC

## 2013-05-27 NOTE — Care Management Note (Signed)
   CARE MANAGEMENT NOTE 05/27/2013  Patient:  Sydney Ford, Sydney Ford   Account Number:  [id number]  Date Initiated:  05/27/2013  Documentation initiated by:  Garnet Overfield  Subjective/Objective Assessment:   58 yo female admitted with neutropenic fever.     Action/Plan:   Home when stable   Anticipated DC Date:  05/27/2013   Anticipated DC Plan:  [place]  CM consult      Choice offered to / List presented to:             Status of service:  Completed, signed off Medicare Important Message given?   (If response is "NO", the following Medicare IM given date fields will be blank) Date Medicare IM given:   Date Additional Medicare IM given:    Discharge Disposition:    Per UR Regulation:  Reviewed for med. necessity/level of care/duration of stay  If discussed at [facility] of Stay Meetings, dates discussed:    Comments:  05/27/13 Alinda Deem [phone] Chart reviewed fo ruitlization of services. No needs assessed prior to discharge.

## 2013-05-27 NOTE — Consult Note (Signed)
[name]  [phone] [phone]     ID: Sydney Ford OB: 1956-02-15  MR#: [id number]  IEP#:[id number]  PCP: Dwan Bolt, MD GYN:   SURolm Bookbinder MD OTHER MD:  CHIEF COMPLAINT: "I had a low grade temperature"  BREAST CANCER HISTORY: Sydney Ford (pronounced "line 'em") herself noted a change in her right breast sometime October 2014 but "my breasts are like bean bags" and she initially ignored it. After while she began to feel a pole when she lay on her right side at night so she scheduled herself for screening mammography at the [facility] 02/08/2013 (prior mammogram had been April 2011), and this did show a possible mass in the right breast. Unilateral right diagnostic mammography and ultrasonography 02/28/2013 confirmed an irregular mass in the right breast upper outer quadrant measuring 2.4 cm. This was palpable at the 11:00 position. Ultrasound showed an irregular hypoechoic mass measuring 1.5 cm with no abnormal adenopathy in the right axilla.  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  The patient met with Dr. Donne Hazel 03/14/2013 and he recommended primary systemic chemotherapy to decrease the size of the tumor and increase the chance of lumpectomy.   Her subsequent history is as detailed below  INTERVAL HISTORY: Sydney Ford had her final cycle of "AC" chemotherapy 03/05, with neulasta the next day. On day 5 in the evening she noted some malaise and a low-grade temperature (never higher than 100.1). There were no unusual symptoms--she always gets headaches and urinary discomfort around this time in her chemotherapy cycle--except for a LUQ discomfort that felt crampy and lasted several hours. The temperature abated w/o intervention  but recurred the next evening. On 05/26/2013 she presented to our clinic with complaints of feeling faint, nauseated, with a headache, and reported the temperatures above-- she was AF when examined. Because of her severe neutropenia and the above historyit was felt prudent by our cross-cover MD, Dr Gladys Damme, that the patient be admitted. Blood clutures and urine culture were obtained in the clinic  REVIEW OF SYSTEMS: Today the patient feels "fine." She is agitating to go home. The LUQ discomfort has not recurred. She is still a little nauseated, which she tells me is "the usual" at this point in her chemotherapy cycle. A detailed ROS this AM was otherwise entirely benign  PAST MEDICAL HISTORY: Past Medical History  Diagnosis Date  . Hypertension   . Hyperlipidemia   . Thyroid disease   . Osteoporosis   . Hypothyroidism     hashimoto's  . Depression   . Pneumonia     2014  . Cold (disease) 03/21/12    couple of weeks ago - pt states much better now  . History of kidney stones     "passed 9 stones"  . GERD (gastroesophageal reflux disease)     no meds except occas tums  . Headache(784.0)     occas migraine  . Cancer     new dx of right breast cancer - planning chemo first  . Arthritis     oa; lower back pain - hx of injury yrs ago-occas flare ups  . Anemia     2005 - related to heavy menses with the thyroid probl    PAST SURGICAL HISTORY: Past Surgical History  Procedure Laterality Date  . Cesarean section  1987  . Tonsillectomy    . Uterine ablation  2008    no menstrual periods since procedure  . Portacath placement N/A 03/23/2013    Procedure: INSERTION PORT-A-CATH;  Surgeon: Rolm Bookbinder, MD;  Location: [facility];  Service: General;  Laterality: N/A;    FAMILY HISTORY Family History  Problem Relation Age of Onset  . Heart failure Mother   . Heart attack Father   . Autoimmune disease Maternal Aunt     lupus, scelraderma, reynolds disease, and others  . Stroke Paternal  Uncle   . Heart failure Maternal Grandmother   . Heart attack Maternal Grandfather   . Throat cancer Paternal Grandmother     dx late 106s; non smoker or drinker  . Heart attack Paternal Grandfather   . Schizophrenia Son 89  . Heart attack Paternal Uncle   the patient's father died at the age of 74 from a myocardial infarction. The patient's mother died at the age of 42 with congestive heart failure. She also had a history of lupus. The patient had no brothers or sisters. There is no history of breast or ovarian cancer in the family to her knowledge.   GYNECOLOGIC HISTORY:  Menarche age 51, first live birth age 73. She is GX P2. She underwent endometrial ablation in 2009 and has not had a period since that time. She never took hormone replacement. She did use birth control pills remotely with no complications  SOCIAL HISTORY:  Sydney Ford works at [employer] for a variety of long-term clients. She does not work for a company. Her husband [name] is semiretired. He works for a company that [employer] on various goods. Son [name] is in [place] and works in Architect. Son [name] lives at home and is disabled with a diagnosis of paranoid schizophrenia. The patient has no grandchildren. She is not a church attender     ADVANCED DIRECTIVES: not in place   HEALTH MAINTENANCE: History  Substance Use Topics  . Smoking status: Former Smoker    Quit date: 03/17/1985  . Smokeless tobacco: Never Used  . Alcohol Use: No     Allergies  Allergen Reactions  . Codeine Itching and Nausea Only    REACTION: Nausea and itching  . Compazine [Prochlorperazine Edisylate] Other (See Comments)    Head aches   . Other     Close contact with Some recycled plastics cause whelps    Current Facility-Administered Medications  Medication Dose Route Frequency Provider Last Rate Last Dose  . 0.9 %  sodium chloride infusion   Intravenous Continuous Bonnielee Haff, MD 75 mL/hr at 05/26/13  2132    . acetaminophen (TYLENOL) tablet 650 mg  650 mg Oral Q6H PRN Bonnielee Haff, MD   650 mg at 05/26/13 2357   Or  . acetaminophen (TYLENOL) suppository 650 mg  650 mg Rectal Q6H PRN Bonnielee Haff, MD      . albuterol (PROVENTIL) (2.5 MG/3ML) 0.083% nebulizer solution 2.5 mg  2.5 mg Nebulization Q2H PRN Bonnielee Haff, MD      . atorvastatin (LIPITOR) tablet 20 mg  20 mg Oral QPM Bonnielee Haff, MD      . ceFEPIme (MAXIPIME) 2 g in dextrose 5 % 50 mL IVPB  2 g Intravenous Q8H Posey Rea, RPH   2 g at 05/27/13 0029  . cholestyramine (QUESTRAN) packet 4 g  4 g Oral BID PRN Bonnielee Haff, MD      . LORazepam (ATIVAN) tablet 0.5 mg  0.5 mg Oral Q8H PRN Bonnielee Haff, MD   0.5 mg at 05/27/13 0230  . ondansetron (ZOFRAN) tablet 4 mg  4 mg Oral Q6H PRN Bonnielee Haff, MD       Or  . ondansetron (ZOFRAN) injection 4 mg  4 mg Intravenous Q6H PRN Bonnielee Haff, MD      . pantoprazole (PROTONIX) EC tablet 40 mg  40 mg Oral Daily Bonnielee Haff, MD      . sertraline (ZOLOFT) tablet 100 mg  100 mg Oral QHS Bonnielee Haff, MD      . thyroid (ARMOUR) tablet 90 mg  90 mg Oral QAC breakfast Bonnielee Haff, MD      . traMADol Veatrice Bourbon) tablet 50 mg  50 mg Oral TID PRN Bonnielee Haff, MD      . traZODone (DESYREL) tablet 50 mg  50 mg Oral QHS PRN Bonnielee Haff, MD      . vancomycin (VANCOCIN) 1,250 mg in sodium chloride 0.9 % 250 mL IVPB  1,250 mg Intravenous Q12H Posey Rea, RPH   1,250 mg at 05/27/13 0230   Facility-Administered Medications Ordered in Other Encounters  Medication Dose Route Frequency Provider Last Rate Last Dose  . sodium chloride 0.9 % injection 10 mL  10 mL Intravenous PRN Amada Kingfisher, MD   10 mL at 05/26/13 1010    OBJECTIVE: middle aged White woman examined at bedside Filed Vitals:   05/27/13 0623  BP: 117/50  Pulse: 81  Temp: 98.1 F (36.7 C)  Resp: 16     Body mass index is 40.74 kg/(m^2).    ECOG FS:1 - Symptomatic but completely ambulatory  Ocular:  Sclerae unicteric Ear-nose-throat: Oropharynx clear Lymphatic: No cervical or supraclavicular adenopathy Lungs no rales or rhonchi, good excursion bilaterally Heart regular rate and rhythm, no murmur appreciated Abd soft, nontender to palpation including LUQ, positive bowel sounds MSK no focal spinal tenderness, no joint edema Neuro: non-focal, well-oriented, appropriate affect Breasts: deferred   LAB RESULTS:  CMP     Component Value Date/Time   NA 142 05/27/2013 0530   NA 143 05/26/2013 0939   K 4.6 05/27/2013 0530   K 4.7 05/26/2013 0939   CL 104 05/27/2013 0530   CO2 28 05/27/2013 0530   CO2 26 05/26/2013 0939   GLUCOSE 89 05/27/2013 0530   GLUCOSE 86 05/26/2013 0939   BUN 15 05/27/2013 0530   BUN 17.8 05/26/2013 0939   CREATININE 0.94 05/27/2013 0530   CREATININE 0.9 05/26/2013 0939   CALCIUM 8.8 05/27/2013 0530   CALCIUM 10.0 05/26/2013 0939   PROT 5.5* 05/27/2013 0530   PROT 6.7 05/26/2013 0939   ALBUMIN 2.9* 05/27/2013 0530   ALBUMIN 3.5 05/26/2013 0939   AST 7 05/27/2013 0530   AST 8 05/26/2013 0939   ALT 11 05/27/2013 0530   ALT 15 05/26/2013 0939   ALKPHOS 65 05/27/2013 0530   ALKPHOS 87 05/26/2013 0939   BILITOT 0.5 05/27/2013 0530   BILITOT 1.02 05/26/2013 0939   GFRNONAA 65* 05/27/2013 0530   GFRAA 75* 05/27/2013 0530    I No results found for this basename: SPEP, UPEP,  kappa and lambda light chains    Lab Results  Component Value Date   WBC 0.2* 05/27/2013   NEUTROABS 0.0* 05/27/2013   HGB 8.0* 05/27/2013   HCT 23.6* 05/27/2013   MCV 87.1 05/27/2013   PLT 79* 05/27/2013    '@LASTCHEMISTRY'$ @  No results found for this basename: LABCA2    No components found with this  basename: [id number]    No results found for this basename: INR,  in the last 168 hours  Urinalysis    Component Value Date/Time   LABSPEC 1.005 05/26/2013 1019   GLUCOSEU Negative 05/26/2013 1019   UROBILINOGEN 0.2 05/26/2013 1019    STUDIES: Dg Chest 2 View  05/26/2013   CLINICAL DATA:  Neutropenic  breast malignancy patient now with fever  EXAM: CHEST  2 VIEW  COMPARISON:  DG CHEST 2 VIEW dated 05/19/2013  FINDINGS: The lungs are slightly less well inflated on the current exam. There is no alveolar infiltrate. The interstitial markings are minimally prominent but a definite interstitial type pneumonia pattern is not seen. The cardiac silhouette is normal in size. The pulmonary vascularity is not engorged. There is no pleural effusion or pneumothorax. The Port-A-Cath appliance appears unchanged in appearance with the tip in the midportion of the SVC. There is a stable rounded calcification that may lie within the spleen under the left hemidiaphragm. The observed portions of the bony thorax appear normal.  IMPRESSION: There is no evidence of pneumonia nor definite evidence of other acute cardiopulmonary abnormality.   Electronically Signed   By: David  Martinique   On: 05/26/2013 16:59   Dg Chest 2 View  05/19/2013   CLINICAL DATA:  Shortness of breath; history of breast carcinoma  EXAM: CHEST  2 VIEW  COMPARISON:  March 23, 2013  FINDINGS: Central catheter tip is in the superior vena cava near the junction with the right atrium. No pneumothorax. There is a calcified granuloma in the left lower lobe. Elsewhere, lungs are clear. Heart size and pulmonary vascularity are normal. No adenopathy. No bone lesions. There is mild degenerative change in the thoracic spine.  IMPRESSION: No edema or consolidation. Granuloma left base. Port-A-Cath as described without pneumothorax.   Electronically Signed   By: Lowella Grip M.D.   On: 05/19/2013 10:35   Mr Breast Bilateral W Wo Contrast  05/17/2013   CLINICAL DATA:  Assess response to neoadjuvant chemotherapy.  EXAM: BILATERAL BREAST MRI WITH AND WITHOUT CONTRAST  TECHNIQUE: Multiplanar, multisequence MR images of both breasts were obtained prior to and following the intravenous administration of 40m of MultiHance  THREE-DIMENSIONAL MR IMAGE RENDERING ON INDEPENDENT  WORKSTATION:  Three-dimensional MR images were rendered by post-processing of the original MR data on an independent workstation. The three-dimensional MR images were interpreted, and findings are reported in the following complete MRI report for this study. Three dimensional images were evaluated at the independent DynaCad workstation  COMPARISON:  Previous breast MRI dated 03/08/2013.  FINDINGS: Breast composition:  Density b:  Scattered fibroglandular.  Background parenchymal enhancement: Mild  Right breast: There has been significant interval decrease in the size and enhancement of the previously described irregular enhancing mass located within the upper outer quadrant of the right breast. Only a small amount of non mass enhancement remains in the region. A measurable discrete mass is no longer present. There are no new findings within the right breast.  Left breast: No mass or abnormal enhancement.  Lymph nodes: No abnormal appearing lymph nodes.  Ancillary findings:  None.  IMPRESSION: Marked decrease in size of the a pre-existing irregularly enhancing mass located within the upper outer quadrant of the right breast as discussed above. No other significant change.  RECOMMENDATION: Treatment plan.  BI-RADS CATEGORY  6: Known biopsy-proven malignancy - appropriate action should be taken.   Electronically Signed   By: RLuberta RobertsonM.D.   On: 05/17/2013 14:03  ASSESSMENT: 58 y.o. 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  (2) being treated in the neoadjuvant setting, the plan being to complete 4 dose dense cycles of doxorubicin/cyclophosphamide, first dose on 03/31/2013. Cycle 4 was delayed one week because of bad weather, completed 05/19/2013 (3) this will be followed by 12 weekly doses of carboplatin/paclitaxel which will be delayed  to 06/09/2013 in light of febrile/ neutropenia episode (4) chemotherapy will be followed by definitive surgery  (3) genetics counseling pending   PLAN: Sydney Ford is looking much healthier than her counts. Today is day 9 from last chemo dose and day 8 from neulasta. I would expect a rapid WBC recovery over the next 2-3 days.-- Cultures are negative but still early. She understands the [facility] is observation with IV ABX until WBC clearly recovering.  However she is desperate to be discharged. I think, especially since the "febrile" part of her "febrile neutropenia" diagnosis is weak (no temperature >100.1 at any point), I would not be uncomfortable with her being discharged today. If so, she should be on cipro 500 mg po BID AND Augmentin 875 mg po BID for the next 5 days. I have set her up for labs at our office 3/16 to confirm WBC have recovered (in which case ABX can be discontinued).  She has prochlorperazine PRN for her mild nausea.  Sydney Ford lives in town, there are no transportation or communication barriers, and she promises to call for a temperature of 100 degrees or greater, or any unusual symptoms.  She has an appt with [place] for 03/19, and was to have started her next chemo then, but we will delay that until the following week in light of the recent events.  Thank you very much for your help to this patient!  Chauncey Cruel, MD   05/27/2013 7:55 AM

## 2013-05-27 NOTE — Discharge Summary (Signed)
Physician Discharge Summary  Sydney Ford [id number] DOB: 06/08/1955 DOA: 05/26/2013  PCP: Dwan Bolt, MD  Admit date: 05/26/2013 Discharge date: 05/27/2013  Recommendations for Outpatient Follow-up:  1. F/U at Dr. Virgie Dad office for a repeat check of CBC/differential on 05/30/13.  Discharge Diagnoses:  Principal Problem:    Neutropenic fever Active Problems:    RENAL CALCULUS, RECURRENT    Breast cancer of upper-outer quadrant of right female breast    Chemotherapy induced neutropenia    Hypothyroidism due to Hashimoto's thyroiditis   Discharge Condition: Stable.  Diet recommendation: Low sodium, heart healthy.  History of present illness:  Sydney Ford is an 58 y.o. female with a past medical history of Hashimoto's thyroiditis, recently diagnosed breast cancer getting neoadjuvant chemotherapy with 4 cycles of doxorubicin/cyclophosphamide, first dose 03/31/2013 which will be followed by 12 weekly doses of carboplatin and paclitaxel prior to definitive surgery, who was referred as a direct admission from the [facility] with fever, fatigue, retching, dysuria and chemotherapy induced neutropenia.  Hospital Course by problem:  Principal Problem:  Neutropenic fever / chemotherapy induced neutropenia  Patient was admitted and placed on broad-spectrum antibiotics with vancomycin and cefepime. Blood cultures and urinalysis were obtained. Urinalysis negative for signs of infection with negative nitrites. Chest x-ray negative for pneumonia. Patient received Neulasta 05/20/13. Cleared for discharge by oncologist and will send home on 5 days of therapy with Cipro/Augmentin per recommendations. Active Problems:  RENAL CALCULUS, RECURRENT  Patient denied pain on admission.  Breast cancer of upper-outer quadrant of right female breast  Undergoing neoadjuvant chemotherapy with surgery planned in the future. Seen by Dr. Jana Hakim  this morning.  Hypothyroidism due to  Hashimoto's thyroiditis  Continue thyroid Armour.  Dyslipidemia  Continue Lipitor.  Procedures:  None.  Consultations:  Dr. Jana Hakim, Oncology  Discharge Exam: Filed Vitals:   05/27/13 0623  BP: 117/50  Pulse: 81  Temp: 98.1 F (36.7 C)  Resp: 16   Filed Vitals:   05/26/13 1458 05/26/13 1500 05/26/13 2137 05/27/13 0623  BP: 134/74  107/56 117/50  Pulse: 84  75 81  Temp: 98.7 F (37.1 C)  98.3 F (36.8 C) 98.1 F (36.7 C)  TempSrc: Oral  Oral Oral  Resp: 16  16 16   Height: 5\' 2"  (1.575 m) 5\' 2"  (1.575 m)    Weight: 100.699 kg (222 lb) 101.061 kg (222 lb 12.8 oz)    SpO2: 97%  98% 100%    Gen:  NAD Cardiovascular:  RRR, No M/R/G Respiratory: Lungs CTAB Gastrointestinal: Abdomen soft, NT/ND with normal active bowel sounds. Extremities: No C/E/C   Discharge Instructions  Discharge Orders   Future Appointments Provider Department Dept Phone   06/02/2013 8:45 AM [facility] [phone]   06/02/2013 9:15 AM Amy Fredderick [facility] [phone]   06/02/2013 10:15 AM [facility] [phone]   06/09/2013 8:45 AM [facility] [phone]   06/09/2013 9:15 AM Amy Fredderick [facility] [phone]   06/09/2013 10:15 AM [facility] [phone]   06/16/2013 8:30 AM [facility] [phone]   06/16/2013 9:00 AM [facility] [phone]   Future Orders Complete By Expires   Call MD for:  extreme fatigue  As directed  Call MD for:  persistant dizziness or light-headedness  As directed    Call MD for:  temperature >100.4  As directed    Diet - low sodium heart healthy  As directed    Discharge instructions  As directed     Comments:     You were cared for by Dr. Jacquelynn Cree  (a hospitalist) during your hospital stay. If you have any questions about your discharge medications or the care you received while you were in the hospital after you are discharged, you can call the unit and ask to speak with the hospitalist on call if the hospitalist that took care of you is not available. Once you are discharged, your primary care physician will handle any further medical issues. Please note that NO REFILLS for any discharge medications will be authorized once you are discharged, as it is imperative that you return to your primary care physician (or establish a relationship with a primary care physician if you do not have one) for your aftercare needs so that they can reassess your need for medications and monitor your lab values.  Any outstanding tests can be reviewed by your PCP at your follow up visit.  It is also important to review any medicine changes with your PCP.  Please bring these d/c instructions with you to your next visit so your physician can review these changes with you.  If you do not have a primary care physician, you can call [phone] for a physician referral.  It is highly recommended that you obtain a PCP for hospital follow up.   Increase activity slowly  As directed        Medication List    STOP taking these medications       lisinopril 20 MG tablet  Commonly known as:  PRINIVIL,ZESTRIL      TAKE these medications       amoxicillin-clavulanate 875-125 MG per tablet  Commonly known as:  AUGMENTIN  Take 1 tablet by mouth 2 (two) times daily.     atorvastatin 20 MG tablet  Commonly known as:  LIPITOR  Take 20 mg by mouth every evening.     ciprofloxacin 500 MG tablet  Commonly known as:  CIPRO  Take 1 tablet (500 mg total) by mouth 2 (two) times daily.     dexamethasone 4 MG tablet  Commonly known as:  DECADRON  Take 4 mg by mouth as needed (Starts daily on Friday - Monday after  Thursdays chemo treatment).     lidocaine-prilocaine cream  Commonly known as:  EMLA  Apply 1 application topically as needed.     LORazepam 0.5 MG tablet  Commonly known as:  ATIVAN  Take 0.5 mg by mouth every 8 (eight) hours as needed for anxiety (or nausea).     meloxicam 15 MG tablet  Commonly known as:  MOBIC  Take 15 mg by mouth daily as needed for pain.     omeprazole 40 MG capsule  Commonly known as:  PRILOSEC  Take 1 capsule (40 mg total) by mouth daily.     ondansetron 8 MG tablet  Commonly known as:  ZOFRAN  Take 8 mg by mouth every 8 (eight) hours as needed for nausea.     sertraline 100 MG tablet  Commonly known as:  ZOLOFT  Take 1 tablet (100 mg total) by mouth at bedtime.     thyroid 90 MG tablet  Commonly known as:  ARMOUR  Take 90 mg by mouth  every morning.     traMADol 50 MG tablet  Commonly known as:  ULTRAM  Take 1-2 tablets (50-100 mg total) by mouth 3 (three) times daily as needed for moderate pain or severe pain.     traZODone 50 MG tablet  Commonly known as:  DESYREL  Take 1 tablet (50 mg total) by mouth at bedtime as needed for sleep.     Vitamin D 2000 UNITS Caps  Take 6,000 Units by mouth every morning.           Follow-up Information   Schedule an appointment as soon as possible for a visit with Dwan Bolt, MD. (As needed)    Specialty:  Endocrinology   Contact information:   [address] [place] [address] [phone]       Follow up with Chauncey Cruel, MD On 05/30/2013.   Specialty:  Oncology   Contact information:   [place] [address] [phone]        The results of significant diagnostics from this hospitalization (including imaging, microbiology, ancillary and laboratory) are listed below for reference.    Significant Diagnostic Studies: Dg Chest 2 View  05/26/2013   CLINICAL DATA:  Neutropenic breast malignancy patient now with fever  EXAM: CHEST  2 VIEW   COMPARISON:  DG CHEST 2 VIEW dated 05/19/2013  FINDINGS: The lungs are slightly less well inflated on the current exam. There is no alveolar infiltrate. The interstitial markings are minimally prominent but a definite interstitial type pneumonia pattern is not seen. The cardiac silhouette is normal in size. The pulmonary vascularity is not engorged. There is no pleural effusion or pneumothorax. The Port-A-Cath appliance appears unchanged in appearance with the tip in the midportion of the SVC. There is a stable rounded calcification that may lie within the spleen under the left hemidiaphragm. The observed portions of the bony thorax appear normal.  IMPRESSION: There is no evidence of pneumonia nor definite evidence of other acute cardiopulmonary abnormality.   Electronically Signed   By: David  Martinique   On: 05/26/2013 16:59   Dg Chest 2 View  05/19/2013   CLINICAL DATA:  Shortness of breath; history of breast carcinoma  EXAM: CHEST  2 VIEW  COMPARISON:  March 23, 2013  FINDINGS: Central catheter tip is in the superior vena cava near the junction with the right atrium. No pneumothorax. There is a calcified granuloma in the left lower lobe. Elsewhere, lungs are clear. Heart size and pulmonary vascularity are normal. No adenopathy. No bone lesions. There is mild degenerative change in the thoracic spine.  IMPRESSION: No edema or consolidation. Granuloma left base. Port-A-Cath as described without pneumothorax.   Electronically Signed   By: Lowella Grip M.D.   On: 05/19/2013 10:35   Mr Breast Bilateral W Wo Contrast  05/17/2013   CLINICAL DATA:  Assess response to neoadjuvant chemotherapy.  EXAM: BILATERAL BREAST MRI WITH AND WITHOUT CONTRAST  TECHNIQUE: Multiplanar, multisequence MR images of both breasts were obtained prior to and following the intravenous administration of 28ml of MultiHance  THREE-DIMENSIONAL MR IMAGE RENDERING ON INDEPENDENT WORKSTATION:  Three-dimensional MR images were rendered by  post-processing of the original MR data on an independent workstation. The three-dimensional MR images were interpreted, and findings are reported in the following complete MRI report for this study. Three dimensional images were evaluated at the independent DynaCad workstation  COMPARISON:  Previous breast MRI dated 03/08/2013.  FINDINGS: Breast composition:  Density b:  Scattered fibroglandular.  Background parenchymal enhancement: Mild  Right breast: There has been significant interval decrease in the size and enhancement of the previously described irregular enhancing mass located within the upper outer quadrant of the right breast. Only a small amount of non mass enhancement remains in the region. A measurable discrete mass is no longer present. There are no new findings within the right breast.  Left breast: No mass or abnormal enhancement.  Lymph nodes: No abnormal appearing lymph nodes.  Ancillary findings:  None.  IMPRESSION: Marked decrease in size of the a pre-existing irregularly enhancing mass located within the upper outer quadrant of the right breast as discussed above. No other significant change.  RECOMMENDATION: Treatment plan.  BI-RADS CATEGORY  6: Known biopsy-proven malignancy - appropriate action should be taken.   Electronically Signed   By: Luberta Robertson M.D.   On: 05/17/2013 14:03    Labs:  Basic Metabolic Panel:  Recent Labs Lab 05/26/13 0938 05/26/13 0939 05/27/13 0530  NA  --  143 142  K  --  4.7 4.6  CL  --   --  104  CO2  --  26 28  GLUCOSE  --  86 89  BUN  --  17.8 15  CREATININE  --  0.9 0.94  CALCIUM  --  10.0 8.8  MG 1.7  --   --    GFR Estimated Creatinine Clearance: 71.7 ml/min (by C-G formula based on Cr of 0.94). Liver Function Tests:  Recent Labs Lab 05/26/13 0939 05/27/13 0530  AST 8 7  ALT 15 11  ALKPHOS 87 65  BILITOT 1.02 0.5  PROT 6.7 5.5*  ALBUMIN 3.5 2.9*   CBC:  Recent Labs Lab 05/26/13 0939 05/27/13 0530  WBC 0.3* 0.2*    NEUTROABS 0.0* 0.0*  HGB 9.9* 8.0*  HCT 29.3* 23.6*  MCV 86.4 87.1  PLT 131* 79*   Microbiology Recent Results (from the past 240 hour(s))  URINE CULTURE     Status: None   Collection Time    05/26/13 10:19 AM      Result Value Ref Range Status   Urine Culture, Routine Culture, Urine   Final   Comment: Final - ===== COLONY COUNT: =====     45,000 COLONIES/ML     Multiple bacterial morphotypes present, none     predominant. Suggest appropriate recollection if      clinically indicated.    Time coordinating discharge: 35 minutes.  Signed:  RAMA,CHRISTINA  Pager [phone] [facility] 05/27/2013, 11:02 AM

## 2013-05-27 NOTE — Telephone Encounter (Signed)
, °

## 2013-05-28 LAB — URINE CULTURE
COLONY COUNT: NO GROWTH
CULTURE: NO GROWTH

## 2013-05-30 ENCOUNTER — Other Ambulatory Visit: Payer: Self-pay

## 2013-05-30 ENCOUNTER — Other Ambulatory Visit: Payer: Self-pay | Admitting: Hematology and Oncology

## 2013-05-30 ENCOUNTER — Other Ambulatory Visit (HOSPITAL_BASED_OUTPATIENT_CLINIC_OR_DEPARTMENT_OTHER): Payer: BC Managed Care – PPO

## 2013-05-30 ENCOUNTER — Encounter: Payer: Self-pay | Admitting: Hematology and Oncology

## 2013-05-30 ENCOUNTER — Ambulatory Visit (HOSPITAL_COMMUNITY)
Admission: RE | Admit: 2013-05-30 | Discharge: 2013-05-30 | Disposition: A | Discharge status: Home / Self Care | Payer: BC Managed Care – PPO | Source: Ambulatory Visit | Attending: Hematology and Oncology | Admitting: Hematology and Oncology

## 2013-05-30 ENCOUNTER — Telehealth: Payer: Self-pay

## 2013-05-30 ENCOUNTER — Ambulatory Visit (HOSPITAL_BASED_OUTPATIENT_CLINIC_OR_DEPARTMENT_OTHER): Payer: BC Managed Care – PPO | Admitting: Hematology and Oncology

## 2013-05-30 ENCOUNTER — Telehealth: Payer: Self-pay | Admitting: *Deleted

## 2013-05-30 VITALS — BP 123/75 | HR 110 | Temp 99.4°F | Resp 18 | Ht 62.0 in | Wt 223.9 lb

## 2013-05-30 DIAGNOSIS — T451X5A Adverse effect of antineoplastic and immunosuppressive drugs, initial encounter: Secondary | ICD-10-CM

## 2013-05-30 DIAGNOSIS — C50411 Malignant neoplasm of upper-outer quadrant of right female breast: Secondary | ICD-10-CM

## 2013-05-30 DIAGNOSIS — N200 Calculus of kidney: Secondary | ICD-10-CM

## 2013-05-30 DIAGNOSIS — C50419 Malignant neoplasm of upper-outer quadrant of unspecified female breast: Secondary | ICD-10-CM

## 2013-05-30 DIAGNOSIS — D701 Agranulocytosis secondary to cancer chemotherapy: Secondary | ICD-10-CM

## 2013-05-30 DIAGNOSIS — R509 Fever, unspecified: Secondary | ICD-10-CM

## 2013-05-30 DIAGNOSIS — N390 Urinary tract infection, site not specified: Secondary | ICD-10-CM

## 2013-05-30 DIAGNOSIS — Z87442 Personal history of urinary calculi: Secondary | ICD-10-CM | POA: Insufficient documentation

## 2013-05-30 LAB — CBC WITH DIFFERENTIAL/PLATELET
BASO%: 0.6 % (ref 0.0–2.0)
Basophils Absolute: 0 10*3/uL (ref 0.0–0.1)
EOS%: 1.3 % (ref 0.0–7.0)
Eosinophils Absolute: 0 10*3/uL (ref 0.0–0.5)
HEMATOCRIT: 27.5 % — AB (ref 34.8–46.6)
HGB: 9 g/dL — ABNORMAL LOW (ref 11.6–15.9)
LYMPH%: 8.1 % — ABNORMAL LOW (ref 14.0–49.7)
MCH: 29 pg (ref 25.1–34.0)
MCHC: 32.7 g/dL (ref 31.5–36.0)
MCV: 88.7 fL (ref 79.5–101.0)
MONO#: 0.4 10*3/uL (ref 0.1–0.9)
MONO%: 11.7 % (ref 0.0–14.0)
NEUT#: 2.4 10*3/uL (ref 1.5–6.5)
NEUT%: 78.3 % — AB (ref 38.4–76.8)
NRBC: 1 % — AB (ref 0–0)
Platelets: 107 10*3/uL — ABNORMAL LOW (ref 145–400)
RBC: 3.1 10*6/uL — AB (ref 3.70–5.45)
RDW: 15.3 % — ABNORMAL HIGH (ref 11.2–14.5)
WBC: 3.1 10*3/uL — AB (ref 3.9–10.3)
lymph#: 0.3 10*3/uL — ABNORMAL LOW (ref 0.9–3.3)

## 2013-05-30 LAB — URINALYSIS, MICROSCOPIC - CHCC
BILIRUBIN (URINE): NEGATIVE
Blood: NEGATIVE
GLUCOSE UR CHCC: NEGATIVE mg/dL
Ketones: NEGATIVE mg/dL
LEUKOCYTE ESTERASE: NEGATIVE
NITRITE: NEGATIVE
Protein: NEGATIVE mg/dL
RBC / HPF: NEGATIVE (ref 0–2)
Specific Gravity, Urine: 1.025 (ref 1.003–1.035)
Urobilinogen, UR: 0.2 mg/dL (ref 0.2–1)
pH: 6 (ref 4.6–8.0)

## 2013-05-30 LAB — COMPREHENSIVE METABOLIC PANEL (CC13)
ALK PHOS: 75 U/L (ref 40–150)
ALT: 13 U/L (ref 0–55)
AST: 10 U/L (ref 5–34)
Albumin: 3.5 g/dL (ref 3.5–5.0)
Anion Gap: 12 mEq/L — ABNORMAL HIGH (ref 3–11)
BILIRUBIN TOTAL: 0.5 mg/dL (ref 0.20–1.20)
BUN: 8.6 mg/dL (ref 7.0–26.0)
CO2: 28 mEq/L (ref 22–29)
Calcium: 9.8 mg/dL (ref 8.4–10.4)
Chloride: 102 mEq/L (ref 98–109)
Creatinine: 0.8 mg/dL (ref 0.6–1.1)
GLUCOSE: 92 mg/dL (ref 70–140)
Potassium: 3.7 mEq/L (ref 3.5–5.1)
Sodium: 142 mEq/L (ref 136–145)
Total Protein: 6.7 g/dL (ref 6.4–8.3)

## 2013-05-30 NOTE — Progress Notes (Signed)
[facility]  [phone] [phone]     ID: Sydney Ford OB: 14-Oct-1955  MR#: [id number]  MHD#:[id number]  PCP: Sydney Bolt, MD GYN:   SU: Sydney Bookbinder, MD OTHER MD: Sydney Blossom, MD  CHIEF COMPLAINT:  Right breast cancer/neoadjuvant chemotherapy   HISTORY OF PRESENT ILLNESS: Sydney Ford (pronounced "line 'em") herself noted a change in her right breast sometime October 2014 but "my breasts are like bean bags" and she initially ignored it. After while she began to feel a pole when she lay on her right side at night so she scheduled herself for screening mammography [facility] 02/08/2013 (prior mammogram had been April 2011), and this did show a possible mass in the right breast. Unilateral right diagnostic mammography and ultrasonography 02/28/2013 confirmed an irregular mass in the right breast upper outer quadrant measuring 2.4 cm. This was palpable at the 11:00 position. Ultrasound showed an irregular hypoechoic mass measuring 1.5 cm with no abnormal adenopathy in the right axilla.  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  The patient met with Dr. Donne Ford 03/14/2013 and he recommended primary systemic chemotherapy to decrease the size of the tumor and increase the chance of lumpectomy.   INTERVAL HISTORY: Patient comes in today stating not feeling well.She has been nauseated for several days after chemotherapy and barely eating anything as a result.She reports left lower abdomen/pelvic area pain.She had some trouble with urination denies hematuria.She has prior history of kidney stones.She passed the last one 2 years ago,at least 5 episodes of kidney stones previously.She reports adequate urination and denies back  pain.She had temp 100 last night and this am at home but afebrile here.She  is asking for nausea medicines   She has advanced right breast carcinoma, completed cycle 4 on 05/19/13 of 4 planned dose dense cycles of doxorubicin/cyclophosphamide being given in the neoadjuvant setting.   Since her last cycle here she had a repeat breast MRI which shows a marked decrease in size in her measurable disease in the breast  REVIEW OF SYSTEMS: She was treated with antibiotics and completed  Cipro for prior ? Respiratory/viral ilness.She denies cough,chest pain or congestion.She denies bone pain besides joint aching.    A detailed review of systems today was otherwise noncontributory   PAST MEDICAL HISTORY: Past Medical History  Diagnosis Date  . Hypertension   . Hyperlipidemia   . Thyroid disease   . Osteoporosis   . Hypothyroidism     hashimoto's  . Depression   . Pneumonia     2014  . Cold (disease) 03/21/12    couple of weeks ago - pt states much better now  . History of kidney stones     "passed 9 stones"  . GERD (gastroesophageal reflux disease)     no meds except occas tums  . Headache(784.0)     occas migraine  . Cancer     new dx of right breast cancer - planning chemo first  . Arthritis     oa; lower back pain - hx of injury yrs ago-occas flare ups  . Anemia     2005 - related to heavy menses with the thyroid probl    PAST SURGICAL HISTORY: Past Surgical History  Procedure Laterality Date  . Cesarean section  1987  .  Tonsillectomy    . Uterine ablation  2008    no menstrual periods since procedure  . Portacath placement N/A 03/23/2013    Procedure: INSERTION PORT-A-CATH;  Surgeon: Sydney Bookbinder, MD;  Location: [facility];  Service: General;  Laterality: N/A;    FAMILY HISTORY Family History  Problem Relation Age of Onset  . Heart failure Mother   . Heart attack Father   . Autoimmune disease Maternal Aunt     lupus, scelraderma, reynolds disease, and others  . Stroke  Paternal Uncle   . Heart failure Maternal Grandmother   . Heart attack Maternal Grandfather   . Throat cancer Paternal Grandmother     dx late 65s; non smoker or drinker  . Heart attack Paternal Grandfather   . Schizophrenia Son 30  . Heart attack Paternal Uncle    the patient's father died at the age of 5 from a myocardial infarction. The patient's mother died at the age of 78 with congestive heart failure. She also had a history of lupus. The patient had no brothers or sisters. There is no history of breast or ovarian cancer in the family to her knowledge.  GYNECOLOGIC HISTORY:  Menarche age 6, first live birth age 23. She is GX P2. She underwent endometrial ablation in 2009 and has not had a period since that time. She never took hormone replacement. She did use birth control pills remotely with no complications  SOCIAL HISTORY: (Updated January 2015) Sydney Ford works at [employer] for a variety of long-term clients. She does not work for a company. Her husband Sydney Ford is semiretired. He works for a company that [employer] data on [place]. Son Sydney Ford is in [place] and works in Architect. Son Sydney Ford lives at home and is disabled with a diagnosis of paranoid schizophrenia. The patient has no grandchildren. She is not a church attender    ADVANCED DIRECTIVES: Not in place   HEALTH MAINTENANCE: (Updated January 2015) History  Substance Use Topics  . Smoking status: Former Smoker    Quit date: 03/17/1985  . Smokeless tobacco: Never Used  . Alcohol Use: No     Colonoscopy: Never  PAP: Remote  Bone density: November 2014, showed osteoporosis   according to the patient's report  Lipid panel:  Not on file, Dr. Wilson Singer   Allergies  Allergen Reactions  . Codeine Itching and Nausea Only    REACTION: Nausea and itching  . Compazine [Prochlorperazine Edisylate] Other (See Comments)    Head aches   . Other     Close contact with Some recycled plastics cause  whelps    Current Outpatient Prescriptions  Medication Sig Dispense Refill  . atorvastatin (LIPITOR) 20 MG tablet Take 20 mg by mouth every evening.       Marland Kitchen amoxicillin-clavulanate (AUGMENTIN) 875-125 MG per tablet Take 1 tablet by mouth 2 (two) times daily.  10 tablet  0  . Cholecalciferol (VITAMIN D) 2000 UNITS CAPS Take 6,000 Units by mouth every morning.      . ciprofloxacin (CIPRO) 500 MG tablet Take 1 tablet (500 mg total) by mouth 2 (two) times daily.  10 tablet  0  . dexamethasone (DECADRON) 4 MG tablet Take 4 mg by mouth as needed (Starts daily on Friday - Monday after Thursdays chemo treatment).       Marland Kitchen lidocaine-prilocaine (EMLA) cream Apply 1 application topically as needed.  30 g  0  . LORazepam (ATIVAN) 0.5 MG tablet Take 0.5 mg by mouth  every 8 (eight) hours as needed for anxiety (or nausea).       . meloxicam (MOBIC) 15 MG tablet Take 15 mg by mouth daily as needed for pain.       Marland Kitchen omeprazole (PRILOSEC) 40 MG capsule Take 1 capsule (40 mg total) by mouth daily.  30 capsule  4  . ondansetron (ZOFRAN) 8 MG tablet Take 8 mg by mouth every 8 (eight) hours as needed for nausea.       . sertraline (ZOLOFT) 100 MG tablet Take 1 tablet (100 mg total) by mouth at bedtime.  30 tablet  1  . thyroid (ARMOUR) 90 MG tablet Take 90 mg by mouth every morning.      . traMADol (ULTRAM) 50 MG tablet Take 1-2 tablets (50-100 mg total) by mouth 3 (three) times daily as needed for moderate pain or severe pain.  100 tablet  0  . traZODone (DESYREL) 50 MG tablet Take 1 tablet (50 mg total) by mouth at bedtime as needed for sleep.  30 tablet  1   Current Facility-Administered Medications  Medication Dose Route Frequency Provider Last Rate Last Dose  . sodium chloride 0.9 % injection 10 mL  10 mL Intravenous PRN Amada Kingfisher, MD   10 mL at 05/26/13 1010    OBJECTIVE: Middle-aged white woman who appears stated age 58 Vitals:   05/26/13 0912  BP: 129/82  Pulse: 112  Temp: 98.8 F (37.1 C)   Resp: 20     Body mass index is 40.74 kg/(m^2).    ECOG FS: 1 Filed Weights   05/26/13 0912  Weight: 222 lb 12.8 oz (101.061 kg)   Sclerae unicteric, pupils equal and reactive Oropharynx clear and moist-- no thrush No cervical or supraclavicular adenopathy Lungs no rales or rhonchi Heart regular rate and rhythm Abd soft, nontender, positive bowel sounds no rebound no CVA tenderness MSK no focal spinal tenderness, no upper extremity lymphedema Neuro: nonfocal, well oriented, appropriate affect Breasts:   Barely palpate  mass in the right breast. There is no skin or nipple change of concern. The right axilla is benign. The left breast is unremarkable  LAB RESULTS: WBC 0.3 HCt 29.3 ANC 0  Ua moderate blood  STUDIES: Dg Chest 2 View  05/19/2013   CLINICAL DATA:  Shortness of breath; history of breast carcinoma  EXAM: CHEST  2 VIEW  COMPARISON:  March 23, 2013  FINDINGS: Central catheter tip is in the superior vena cava near the junction with the right atrium. No pneumothorax. There is a calcified granuloma in the left lower lobe. Elsewhere, lungs are clear. Heart size and pulmonary vascularity are normal. No adenopathy. No bone lesions. There is mild degenerative change in the thoracic spine.  IMPRESSION: No edema or consolidation. Granuloma left base. Port-A-Cath as described without pneumothorax.   Electronically Signed   By: Lowella Grip M.D.   On: 05/19/2013 10:35   Mr Breast Bilateral W Wo Contrast  05/17/2013   CLINICAL DATA:  Assess response to neoadjuvant chemotherapy.  EXAM: BILATERAL BREAST MRI WITH AND WITHOUT CONTRAST  TECHNIQUE: Multiplanar, multisequence MR images of both breasts were obtained prior to and following the intravenous administration of 10m of MultiHance  THREE-DIMENSIONAL MR IMAGE RENDERING ON INDEPENDENT WORKSTATION:  Three-dimensional MR images were rendered by post-processing of the original MR data on an independent workstation. The three-dimensional MR  images were interpreted, and findings are reported in the following complete MRI report for this study. Three dimensional images were evaluated at  the independent [employer] workstation  COMPARISON:  Previous breast MRI dated 03/08/2013.  FINDINGS: Breast composition:  Density b:  Scattered fibroglandular.  Background parenchymal enhancement: Mild  Right breast: There has been significant interval decrease in the size and enhancement of the previously described irregular enhancing mass located within the upper outer quadrant of the right breast. Only a small amount of non mass enhancement remains in the region. A measurable discrete mass is no longer present. There are no new findings within the right breast.  Left breast: No mass or abnormal enhancement.  Lymph nodes: No abnormal appearing lymph nodes.  Ancillary findings:  None.  IMPRESSION: Marked decrease in size of the a pre-existing irregularly enhancing mass located within the upper outer quadrant of the right breast as discussed above. No other significant change.  RECOMMENDATION: Treatment plan.  BI-RADS CATEGORY  6: Known biopsy-proven malignancy - appropriate action should be taken.   Electronically Signed   By: Luberta Robertson M.D.   On: 05/17/2013 14:03      ASSESSMENT: 58 y.o. 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  (2)  being treated in the neoadjuvant setting, the plan being to complete 4 dose dense cycles of doxorubicin/cyclophosphamide, first dose on 03/31/2013. Cycle 4 was delayed one week because of bad weather. She will be given Neulasta on day 2 for granulocyte support. This regimen will be followed by 12 weekly doses of carboplatin/paclitaxel prior to definitive surgery.   (3)  genetics counseling pending  PLAN: Breast cancer as above S/P cycle #4 of AC.  Patient neutropenic and reported  fever at home 100 not documented here.She is symptomatic with nausea and very little oral intake.We started Iv fluids awaiting for labs  At this point recommend admission and patient feeling poorly and agreed.I called and discussed case and concerns with hospitalist that will admit patient.She has already   received Neulasta.She will need pancultures and IV antibiotics with Iv fluids awaiting cultures and bone marrow recovery from recent chemotherapy. Patient with history of recurrent kidney stones and given symptomatology I suspect kidney infection  due to underlying kidney stone.consider Urology evaluation.   Will follow up as outpatient after D/C to resume planned chemotherapy.      Once she recovers from this treatment, she will start her weekly carboplatin/paclitaxel doses. Today we discussed the possible toxicities, side effects and complications of these treatments which hopefully will be milder as far as her symptoms are concerned. She understands one concern regarding treatment with Taxol is peripheral neuropathy, which may not develop at all, may develop and been mild and reversible, or may be permanent. We will watch him closely regarding that specific symptom among others  Sydney Ford has a good understanding of the overall plan. She agrees with it. She will call with any problems that may develop before her next visit here.   Amada Kingfisher, MD   05/30/2013 9:16 AM

## 2013-05-30 NOTE — Telephone Encounter (Signed)
Advised patient per Dr Owens Loffler that patients ultrasound still shows non obstructing kidney stones.  Encouraged her to call back if her fever spikes above 100.5 and to keep her appointment with Micah Flesher PA 06/02/13.  Patient has no further questions at this time and will call back with any other concerns.

## 2013-05-30 NOTE — Telephone Encounter (Signed)
BILATERAL NON OBSTRUCTING RENAL STONES. READ BY DR.LUKENS. PT. Sydney Ford. THIS NOTE TO DR.FAIDAS.

## 2013-05-31 LAB — URINE CULTURE

## 2013-06-01 ENCOUNTER — Other Ambulatory Visit: Payer: Self-pay | Admitting: Physician Assistant

## 2013-06-01 LAB — CULTURE, BLOOD (SINGLE)

## 2013-06-02 ENCOUNTER — Telehealth: Payer: Self-pay | Admitting: *Deleted

## 2013-06-02 ENCOUNTER — Ambulatory Visit (HOSPITAL_BASED_OUTPATIENT_CLINIC_OR_DEPARTMENT_OTHER): Payer: BC Managed Care – PPO | Admitting: Physician Assistant

## 2013-06-02 ENCOUNTER — Encounter: Payer: Self-pay | Admitting: Physician Assistant

## 2013-06-02 ENCOUNTER — Other Ambulatory Visit (HOSPITAL_BASED_OUTPATIENT_CLINIC_OR_DEPARTMENT_OTHER): Payer: BC Managed Care – PPO

## 2013-06-02 ENCOUNTER — Ambulatory Visit: Payer: BC Managed Care – PPO

## 2013-06-02 VITALS — BP 127/75 | HR 108 | Temp 99.0°F | Resp 18 | Ht 62.0 in | Wt 221.6 lb

## 2013-06-02 DIAGNOSIS — C50411 Malignant neoplasm of upper-outer quadrant of right female breast: Secondary | ICD-10-CM

## 2013-06-02 DIAGNOSIS — R5383 Other fatigue: Secondary | ICD-10-CM

## 2013-06-02 DIAGNOSIS — C50419 Malignant neoplasm of upper-outer quadrant of unspecified female breast: Secondary | ICD-10-CM

## 2013-06-02 DIAGNOSIS — R300 Dysuria: Secondary | ICD-10-CM

## 2013-06-02 DIAGNOSIS — D649 Anemia, unspecified: Secondary | ICD-10-CM

## 2013-06-02 DIAGNOSIS — Z171 Estrogen receptor negative status [ER-]: Secondary | ICD-10-CM

## 2013-06-02 DIAGNOSIS — R5381 Other malaise: Secondary | ICD-10-CM

## 2013-06-02 DIAGNOSIS — N200 Calculus of kidney: Secondary | ICD-10-CM

## 2013-06-02 DIAGNOSIS — R509 Fever, unspecified: Secondary | ICD-10-CM

## 2013-06-02 LAB — COMPREHENSIVE METABOLIC PANEL (CC13)
ALBUMIN: 3.3 g/dL — AB (ref 3.5–5.0)
ALT: 21 U/L (ref 0–55)
ANION GAP: 13 meq/L — AB (ref 3–11)
AST: 18 U/L (ref 5–34)
Alkaline Phosphatase: 83 U/L (ref 40–150)
BILIRUBIN TOTAL: 0.47 mg/dL (ref 0.20–1.20)
BUN: 9.7 mg/dL (ref 7.0–26.0)
CALCIUM: 9.6 mg/dL (ref 8.4–10.4)
CHLORIDE: 105 meq/L (ref 98–109)
CO2: 25 mEq/L (ref 22–29)
Creatinine: 0.9 mg/dL (ref 0.6–1.1)
GLUCOSE: 104 mg/dL (ref 70–140)
Potassium: 4.1 mEq/L (ref 3.5–5.1)
SODIUM: 142 meq/L (ref 136–145)
TOTAL PROTEIN: 6.6 g/dL (ref 6.4–8.3)

## 2013-06-02 LAB — CBC WITH DIFFERENTIAL/PLATELET
BASO%: 0.5 % (ref 0.0–2.0)
Basophils Absolute: 0 10*3/uL (ref 0.0–0.1)
EOS%: 0.3 % (ref 0.0–7.0)
Eosinophils Absolute: 0 10*3/uL (ref 0.0–0.5)
HEMATOCRIT: 26.6 % — AB (ref 34.8–46.6)
HGB: 9.1 g/dL — ABNORMAL LOW (ref 11.6–15.9)
LYMPH#: 0.3 10*3/uL — AB (ref 0.9–3.3)
LYMPH%: 4.2 % — AB (ref 14.0–49.7)
MCH: 30.4 pg (ref 25.1–34.0)
MCHC: 34.1 g/dL (ref 31.5–36.0)
MCV: 89.1 fL (ref 79.5–101.0)
MONO#: 0.5 10*3/uL (ref 0.1–0.9)
MONO%: 8.6 % (ref 0.0–14.0)
NEUT#: 5.4 10*3/uL (ref 1.5–6.5)
NEUT%: 86.4 % — ABNORMAL HIGH (ref 38.4–76.8)
PLATELETS: 208 10*3/uL (ref 145–400)
RBC: 2.98 10*6/uL — ABNORMAL LOW (ref 3.70–5.45)
RDW: 17 % — ABNORMAL HIGH (ref 11.2–14.5)
WBC: 6.2 10*3/uL (ref 3.9–10.3)

## 2013-06-02 NOTE — Telephone Encounter (Signed)
Per staff message and POF I have scheduled appts.  JMW  

## 2013-06-02 NOTE — Progress Notes (Signed)
[place]  [phone] [phone]     ID: Sydney Ford OB: [birth year]  MR#: [id number]  UMP#:[id number]  PCP: Dwan Bolt, MD GYN:   SU: Rolm Bookbinder, MD OTHER MD: Ulyess Blossom, MD  CHIEF COMPLAINT:  Right breast cancer/neoadjuvant chemotherapy   HISTORY OF PRESENT ILLNESS: Sydney Ford (pronounced "line 'em") herself noted a change in her right breast sometime October 2014 but "my breasts are like bean bags" and she initially ignored it. After while she began to feel a pole when she lay on her right side at night so she scheduled herself for screening mammography at [facility] 02/08/2013 (prior mammogram had been April 2011), and this did show a possible mass in the right breast. Unilateral right diagnostic mammography and ultrasonography 02/28/2013 confirmed an irregular mass in the right breast upper outer quadrant measuring 2.4 cm. This was palpable at the 11:00 position. Ultrasound showed an irregular hypoechoic mass measuring 1.5 cm with no abnormal adenopathy in the right axilla.  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  The patient met with Dr. Donne Hazel 03/14/2013 and he recommended primary systemic chemotherapy to decrease the size of the tumor and increase the chance of lumpectomy.   INTERVAL HISTORY: Sydney Ford returns alone today for followup of her locally advanced right breast carcinoma.  She completed her 4 dose dense cycles of neoadjuvant doxorubicin/cyclophosphamide, her final dose given on 05/19/2013. She is here today to discuss the initiation of her next regimen, specifically 12 weekly doses of paclitaxel/carboplatin. She is due for her first dose today.  Interval history is notable for the fact that Sydney Ford  experienced febrile neutropenia following the last cycle. She's been on antibiotics. A neutropenic workup was unremarkable including a chest x-ray, urine culture, and blood cultures. Her counts have slowly recovered, but she is still running some "low-grade fever" at times. Her highest reported temp recently was 100.4 yesterday. She completed her Augmentin this morning, and tells me she feels "so much better today".     Sydney Ford still feels tired. She still having some mild dysuria, but has noted no hematuria. A recent renal ultrasound did confirm kidney stones but these were nonobstructive and stable.   REVIEW OF SYSTEMS: Sydney Ford denies any rashes or skin changes and has had no abnormal bruising or bleeding. Her appetite is fair. She does have some episodes of nausea, but no emesis. She's having regular bowel movements. She denies any increased cough. She has shortness of breath with exertion which is stable, but denies any orthopnea. She's had no peripheral swelling. She denies any chest pain or palpitations. Sydney Ford also denies any abnormal headaches or dizziness. Currently, she denies any new or unusual myalgias, arthralgias, or bony pain. She does have some chronic back pain and joint pain, both of which are stable. At baseline, she also denies any peripheral neuropathy.  A detailed review of systems is otherwise stable and noncontributory.    PAST MEDICAL HISTORY: Past Medical History  Diagnosis Date  . Hypertension   . Hyperlipidemia   . Thyroid disease   . Osteoporosis   . Hypothyroidism     hashimoto's  . Depression   . Pneumonia     2014  . Cold (disease) 03/21/12    couple of weeks ago - pt states much better now  . History  of kidney stones     "passed 9 stones"  . GERD (gastroesophageal reflux disease)     no meds except occas tums  . Headache(784.0)     occas migraine  . Cancer     new dx of right breast cancer - planning chemo first  . Arthritis     oa; lower back pain - hx of  injury yrs ago-occas flare ups  . Anemia     2005 - related to heavy menses with the thyroid probl    PAST SURGICAL HISTORY: Past Surgical History  Procedure Laterality Date  . Cesarean section  1987  . Tonsillectomy    . Uterine ablation  2008    no menstrual periods since procedure  . Portacath placement N/A 03/23/2013    Procedure: INSERTION PORT-A-CATH;  Surgeon: Rolm Bookbinder, MD;  Location: [facility];  Service: General;  Laterality: N/A;    FAMILY HISTORY Family History  Problem Relation Age of Onset  . Heart failure Mother   . Heart attack Father   . Autoimmune disease Maternal Aunt     lupus, scelraderma, reynolds disease, and others  . Stroke Paternal Uncle   . Heart failure Maternal Grandmother   . Heart attack Maternal Grandfather   . Throat cancer Paternal Grandmother     dx late 95s; non smoker or drinker  . Heart attack Paternal Grandfather   . Schizophrenia Son 50  . Heart attack Paternal Uncle    the patient's father died at the age of 25 from a myocardial infarction. The patient's mother died at the age of 78 with congestive heart failure. She also had a history of lupus. The patient had no brothers or sisters. There is no history of breast or ovarian cancer in the family to her knowledge.  GYNECOLOGIC HISTORY:  Menarche age 58, first live birth age 58. She is GX P2. She underwent endometrial ablation in 2009 and has not had a period since that time. She never took hormone replacement. She did use birth control pills remotely with no complications  SOCIAL HISTORY: (Updated 06/02/2013) Sydney Ford works at [employer] for a variety of long-term clients. She does not work for a company. Her husband [name] is semiretired. He works for a company that [employer] data on [place]. Son Sydney Ford is in [place] and works in Architect. Son Sydney Ford lives at home and is disabled with a diagnosis of paranoid schizophrenia. The patient has no grandchildren.  She is not a church attender    ADVANCED DIRECTIVES: Not in place   HEALTH MAINTENANCE: (Updated 06/02/2013) History  Substance Use Topics  . Smoking status: Former Smoker    Quit date: 03/17/1985  . Smokeless tobacco: Never Used  . Alcohol Use: No     Colonoscopy: Never  PAP: Remote  Bone density: November 2014, showed osteoporosis   according to the patient's report  Lipid panel:  Not on file, Dr. Wilson Singer   Allergies  Allergen Reactions  . Codeine Itching and Nausea Only    REACTION: Nausea and itching  . Compazine [Prochlorperazine Edisylate] Other (See Comments)    Head aches   . Other     Close contact with Some recycled plastics cause whelps  . Tape Rash    Needs OPSITE dressing with port access    Current Outpatient Prescriptions  Medication Sig Dispense Refill  . atorvastatin (LIPITOR) 20 MG tablet Take 20 mg by mouth every evening.       . Cholecalciferol (  VITAMIN D) 2000 UNITS CAPS Take 6,000 Units by mouth every morning.      . lidocaine-prilocaine (EMLA) cream Apply 1 application topically as needed.  30 g  0  . meloxicam (MOBIC) 15 MG tablet Take 15 mg by mouth daily as needed for pain.       Marland Kitchen omeprazole (PRILOSEC) 40 MG capsule Take 1 capsule (40 mg total) by mouth daily.  30 capsule  4  . sertraline (ZOLOFT) 100 MG tablet Take 1 tablet (100 mg total) by mouth at bedtime.  30 tablet  1  . thyroid (ARMOUR) 90 MG tablet Take 90 mg by mouth every morning.      . traMADol (ULTRAM) 50 MG tablet Take 1-2 tablets (50-100 mg total) by mouth 3 (three) times daily as needed for moderate pain or severe pain.  100 tablet  0  . traZODone (DESYREL) 50 MG tablet Take 1 tablet (50 mg total) by mouth at bedtime as needed for sleep.  30 tablet  1  . venlafaxine (EFFEXOR) 37.5 MG tablet       . cholestyramine (QUESTRAN) 4 G packet       . dexamethasone (DECADRON) 4 MG tablet Take 4 mg by mouth as needed (Starts daily on Friday - Monday after Thursdays chemo treatment).       .  LORazepam (ATIVAN) 0.5 MG tablet Take 0.5 mg by mouth every 8 (eight) hours as needed for anxiety (or nausea).       . ondansetron (ZOFRAN) 8 MG tablet Take 8 mg by mouth every 8 (eight) hours as needed for nausea.       Marland Kitchen oxyCODONE-acetaminophen (PERCOCET/ROXICET) 5-325 MG per tablet       . prochlorperazine (COMPAZINE) 10 MG tablet        No current facility-administered medications for this visit.    OBJECTIVE: Middle-aged white woman who appears tired but is in no acute distress Filed Vitals:   06/02/13 0938  BP: 127/75  Pulse: 108  Temp: 99 F (37.2 C)  Resp: 18     Body mass index is 40.52 kg/(m^2).    ECOG FS: 1 Filed Weights   06/02/13 0938  Weight: 221 lb 9.6 oz (100.517 kg)   Physical Exam: HEENT:  Sclerae anicteric.  Oropharynx clear and moist. No ulcerations or mucositis noted. Neck supple, trachea midline.  NODES:  No cervical or supraclavicular lymphadenopathy palpated.  BREAST EXAM:  Deferred. Axillae are benign bilaterally, with no palpable lymphadenopathy. LUNGS:  Clear to auscultation bilaterally with good excursion. No dullness to percussion.  No wheezes or rhonchi HEART:  Regular  rhythm. Mildly tachycardic. ABDOMEN:  Soft, obese, nontender.  Positive bowel sounds.  MSK:  No focal spinal tenderness to palpation. Good range of motion bilaterally in the upper extremities. EXTREMITIES:  No peripheral edema.   SKIN:  Benign with no visible skin changes or lesions. No nail dyscrasia. No excessive ecchymoses. No petechiae. Mild pallor. NEURO:  Nonfocal. Well oriented.  Appropriate affect.  And LAB RESULTS:  Lab Results  Component Value Date   WBC 6.2 06/02/2013   NEUTROABS 5.4 06/02/2013   HGB 9.1* 06/02/2013   HCT 26.6* 06/02/2013   MCV 89.1 06/02/2013   PLT 208 06/02/2013      Chemistry      Component Value Date/Time   NA 142 06/02/2013 0904   NA 142 05/27/2013 0530   K 4.1 06/02/2013 0904   K 4.6 05/27/2013 0530   CL 104 05/27/2013 0530   CO2 25  06/02/2013  0904   CO2 28 05/27/2013 0530   BUN 9.7 06/02/2013 0904   BUN 15 05/27/2013 0530   CREATININE 0.9 06/02/2013 0904   CREATININE 0.94 05/27/2013 0530      Component Value Date/Time   CALCIUM 9.6 06/02/2013 0904   CALCIUM 8.8 05/27/2013 0530   ALKPHOS 83 06/02/2013 0904   ALKPHOS 65 05/27/2013 0530   AST 18 06/02/2013 0904   AST 7 05/27/2013 0530   ALT 21 06/02/2013 0904   ALT 11 05/27/2013 0530   BILITOT 0.47 06/02/2013 0904   BILITOT 0.5 05/27/2013 0530       STUDIES:  US Renal 05/30/2013    CLINICAL DATA: Fever, history of kidney stones  EXAM:  RENAL/URINARY TRACT ULTRASOUND COMPLETE  COMPARISON: None.  FINDINGS:  Right Kidney:  Length: 9.3 cm. 8 mm echogenic focus is noted in the lower pole  consistent with a nonobstructing stone.  Left Kidney:  Length: 11.8 cm. Multiple echogenic foci are noted within the left  kidney consistent with nonobstructing stones. Largest of these  measures 8 mm.  Bladder:  Appears normal for degree of bladder distention.  IMPRESSION:  Bilateral nonobstructing renal stones  Electronically Signed  By: Inez Catalina M.D.  On: 05/30/2013 12:00    DG Chest 2 View 05/26/2013  CLINICAL DATA: Neutropenic breast malignancy patient now with fever  EXAM:  CHEST 2 VIEW  COMPARISON: DG CHEST 2 VIEW dated 05/19/2013  FINDINGS:  The lungs are slightly less well inflated on the current exam. There  is no alveolar infiltrate. The interstitial markings are minimally  prominent but a definite interstitial type pneumonia pattern is not  seen. The cardiac silhouette is normal in size. The pulmonary  vascularity is not engorged. There is no pleural effusion or  pneumothorax. The Port-A-Cath appliance appears unchanged in  appearance with the tip in the midportion of the SVC. There is a  stable rounded calcification that may lie within the spleen under  the left hemidiaphragm. The observed portions of the bony thorax  appear normal.  IMPRESSION:  There is no  evidence of pneumonia nor definite evidence of other  acute cardiopulmonary abnormality.  Electronically Signed  By: David Martinique  On: 05/26/2013 16:59    Mr Breast Bilateral W Wo Contrast 05/17/2013   CLINICAL DATA:  Assess response to neoadjuvant chemotherapy.  EXAM: BILATERAL BREAST MRI WITH AND WITHOUT CONTRAST  TECHNIQUE: Multiplanar, multisequence MR images of both breasts were obtained prior to and following the intravenous administration of 81m of MultiHance  THREE-DIMENSIONAL MR IMAGE RENDERING ON INDEPENDENT WORKSTATION:  Three-dimensional MR images were rendered by post-processing of the original MR data on an independent workstation. The three-dimensional MR images were interpreted, and findings are reported in the following complete MRI report for this study. Three dimensional images were evaluated at the independent DynaCad workstation  COMPARISON:  Previous breast MRI dated 03/08/2013.  FINDINGS: Breast composition:  Density b:  Scattered fibroglandular.  Background parenchymal enhancement: Mild  Right breast: There has been significant interval decrease in the size and enhancement of the previously described irregular enhancing mass located within the upper outer quadrant of the right breast. Only a small amount of non mass enhancement remains in the region. A measurable discrete mass is no longer present. There are no new findings within the right breast.  Left breast: No mass or abnormal enhancement.  Lymph nodes: No abnormal appearing lymph nodes.  Ancillary findings:  None.  IMPRESSION: Marked decrease in size of the a pre-existing  irregularly enhancing mass located within the upper outer quadrant of the right breast as discussed above. No other significant change.  RECOMMENDATION: Treatment plan.  BI-RADS CATEGORY  6: Known biopsy-proven malignancy - appropriate action should be taken.   Electronically Signed   By: Luberta Robertson M.D.   On: 05/17/2013 14:03      ASSESSMENT: 58 y.o.  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  (2)  being treated in the neoadjuvant setting, the plan being to complete 4 dose dense cycles of doxorubicin/cyclophosphamide, first dose on 03/31/2013 and final dose on 05/19/2013. Cycle 4 was delayed one week because of bad weather.  Neulasta on day 2 for granulocyte support.   (3)  doxorubicin/cyclophosphamide is being followed by 12 weekly doses of carboplatin/paclitaxel prior to definitive surgery, first dose being delayed by one week to allow recovery secondary to recent febrile neutropenia. Scheduled to receive first cycle on 06/09/2013.  (3)  genetics counseling pending  PLAN: Over half of our 35 minute appointment today was spent reviewing pounds recent lab reports and imaging studies, discussing her concerns and questions, reviewing her treatment options, and coordinating care.  Fortunately, her neutrophils have recovered nicely today and she is afebrile today which is all positive. She is, however, still very weak and tired today, and we both agree it would be most prudent to give her one extra week to recover prior to initiating her next regimen which will be weekly paclitaxel/carboplatin.  We will hold treatment today, and Sydney Ford will return next week on March 26 in anticipation of day 1 cycle 1 of paclitaxel/carboplatin. We will continue to see her on a weekly basis and follow her very closely with labs and physical exam. I am going to repeat her urinalysis and culture when she returns next week since she is just finishing her antibiotic, and is still feeling some slight dysuria. She will also followup with her urologist if needed for the nonobstructing renal stones.  All this was reviewed in detail with him today. She voices understanding and agreement with our plan, and will call for any changes or  problems prior to her appointment here next week.   Cariah Salatino, PA-C   06/02/2013 10:22 AM

## 2013-06-05 ENCOUNTER — Encounter: Payer: Self-pay | Admitting: Hematology and Oncology

## 2013-06-05 LAB — CULTURE, BLOOD (SINGLE)

## 2013-06-05 NOTE — Progress Notes (Signed)
[place]  [phone] [phone]) [phone]     ID: [name] OB: 1955/09/10  MR#: [id number]  YBW#:[id number]  PCP: Dwan Bolt, MD GYN:   SU: Rolm Bookbinder, MD OTHER MD: Ulyess Blossom, MD  CHIEF COMPLAINT:  Right breast cancer/neoadjuvant chemotherapy   HISTORY OF PRESENT ILLNESS: Sydney Ford (pronounced "line 'em") herself noted a change in her right breast sometime October 2014 but "my breasts are like bean bags" and she initially ignored it. After while she began to feel a pole when she lay on her right side at night so she scheduled herself for screening mammography at the [facility] 02/08/2013 (prior mammogram had been April 2011), and this did show a possible mass in the right breast. Unilateral right diagnostic mammography and ultrasonography 02/28/2013 confirmed an irregular mass in the right breast upper outer quadrant measuring 2.4 cm. This was palpable at the 11:00 position. Ultrasound showed an irregular hypoechoic mass measuring 1.5 cm with no abnormal adenopathy in the right axilla.  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  The patient met with Dr. Donne Hazel 03/14/2013 and he recommended primary systemic chemotherapy to decrease the size of the tumor and increase the chance of lumpectomy.   INTERVAL HISTORY:as per 05/26/13 visit Patient comes in today stating not feeling well.She has been nauseated for several days after chemotherapy and barely eating anything as a result.She reports left lower abdomen/pelvic area pain.She had some trouble with urination denies hematuria.She has prior history of kidney stones.She passed the last one 2 years ago,at least 5 episodes of kidney stones previously.She reports adequate urination  and denies back pain.She had temp 100 last night and this am at home but afebrile here.  She has advanced right breast carcinoma, completed cycle 4 on 05/19/13 of 4 planned dose dense cycles of doxorubicin/cyclophosphamide being given in the neoadjuvant setting.   Since her last cycle here she had a repeat breast MRI which shows a marked decrease in size in her measurable disease in the breast  Today patient in follow up following hospital D/C.Feeling much better she states  She tells me passed a kidney stone again this weekend after she was D/c home. She was hospitalized due to neutropenis fever with symptoms suggestive of UTI and recurrent  kidney stones history.She received IV Vanc and Cefipime and D/C home on oral Cipro and Augmentin.  REVIEW OF SYSTEMS: She reports intermittent fever at home 100.4-100.6.She was seen by Urology long time ago.  She denies cough,chest pain or congestion.She denies bone pain besides joint aching.  Heart rate elevated 2 weeks now on Armour    A detailed review of systems today was otherwise noncontributory   PAST MEDICAL HISTORY: Past Medical History  Diagnosis Date  . Hypertension   . Hyperlipidemia   . Thyroid disease   . Osteoporosis   . Hypothyroidism     hashimoto's  . Depression   . Pneumonia     2014  . Cold (disease) 03/21/12    couple of weeks ago - pt states much better now  . History of kidney stones     "passed 9 stones"  . GERD (gastroesophageal reflux disease)     no meds except occas tums  . Headache(784.0)     occas migraine  . Cancer  new dx of right breast cancer - planning chemo first  . Arthritis     oa; lower back pain - hx of injury yrs ago-occas flare ups  . Anemia     2005 - related to heavy menses with the thyroid probl    PAST SURGICAL HISTORY: Past Surgical History  Procedure Laterality Date  . Cesarean section  1987  . Tonsillectomy    . Uterine ablation  2008    no menstrual periods since procedure   . Portacath placement N/A 03/23/2013    Procedure: INSERTION PORT-A-CATH;  Surgeon: Rolm Bookbinder, MD;  Location: [facility];  Service: General;  Laterality: N/A;    FAMILY HISTORY Family History  Problem Relation Age of Onset  . Heart failure Mother   . Heart attack Father   . Autoimmune disease Maternal Aunt     lupus, scelraderma, reynolds disease, and others  . Stroke Paternal Uncle   . Heart failure Maternal Grandmother   . Heart attack Maternal Grandfather   . Throat cancer Paternal Grandmother     dx late 90s; non smoker or drinker  . Heart attack Paternal Grandfather   . Schizophrenia Son 29  . Heart attack Paternal Uncle    the patient's father died at the age of 32 from a myocardial infarction. The patient's mother died at the age of 82 with congestive heart failure. She also had a history of lupus. The patient had no brothers or sisters. There is no history of breast or ovarian cancer in the family to her knowledge.  GYNECOLOGIC HISTORY:  Menarche age 10, first live birth age 68. She is GX P2. She underwent endometrial ablation in 2009 and has not had a period since that time. She never took hormone replacement. She did use birth control pills remotely with no complications  SOCIAL HISTORY: (Updated January 2015) [name] works at [employer] for a variety of long-term clients. She does not work for a company. Her husband [name] is semiretired. He works for a company that [employer] data on [place]. Son [name] is in [place] and works in Architect. Son [name] lives at home and is disabled with a diagnosis of paranoid schizophrenia. The patient has no grandchildren. She is not a church attender    ADVANCED DIRECTIVES: Not in place   HEALTH MAINTENANCE: (Updated January 2015) History  Substance Use Topics  . Smoking status: Former Smoker    Quit date: 03/17/1985  . Smokeless tobacco: Never Used  . Alcohol Use: No     Colonoscopy: Never  PAP:  Remote  Bone density: November 2014, showed osteoporosis   according to the patient's report  Lipid panel:  Not on file, Dr. Wilson Singer   Allergies  Allergen Reactions  . Codeine Itching and Nausea Only    REACTION: Nausea and itching  . Compazine [Prochlorperazine Edisylate] Other (See Comments)    Head aches   . Other     Close contact with Some recycled plastics cause whelps  . Tape Rash    Needs OPSITE dressing with port access    Current Outpatient Prescriptions  Medication Sig Dispense Refill  . atorvastatin (LIPITOR) 20 MG tablet Take 20 mg by mouth every evening.       . Cholecalciferol (VITAMIN D) 2000 UNITS CAPS Take 6,000 Units by mouth every morning.      Marland Kitchen dexamethasone (DECADRON) 4 MG tablet Take 4 mg by mouth as needed (Starts daily on Friday - Monday after Thursdays chemo  treatment).       . LORazepam (ATIVAN) 0.5 MG tablet Take 0.5 mg by mouth every 8 (eight) hours as needed for anxiety (or nausea).       . meloxicam (MOBIC) 15 MG tablet Take 15 mg by mouth daily as needed for pain.       Marland Kitchen omeprazole (PRILOSEC) 40 MG capsule Take 1 capsule (40 mg total) by mouth daily.  30 capsule  4  . ondansetron (ZOFRAN) 8 MG tablet Take 8 mg by mouth every 8 (eight) hours as needed for nausea.       . sertraline (ZOLOFT) 100 MG tablet Take 1 tablet (100 mg total) by mouth at bedtime.  30 tablet  1  . thyroid (ARMOUR) 90 MG tablet Take 90 mg by mouth every morning.      . traMADol (ULTRAM) 50 MG tablet Take 1-2 tablets (50-100 mg total) by mouth 3 (three) times daily as needed for moderate pain or severe pain.  100 tablet  0  . traZODone (DESYREL) 50 MG tablet Take 1 tablet (50 mg total) by mouth at bedtime as needed for sleep.  30 tablet  1  . cholestyramine (QUESTRAN) 4 G packet       . lidocaine-prilocaine (EMLA) cream Apply 1 application topically as needed.  30 g  0  . oxyCODONE-acetaminophen (PERCOCET/ROXICET) 5-325 MG per tablet       . prochlorperazine (COMPAZINE) 10 MG tablet        . venlafaxine (EFFEXOR) 37.5 MG tablet        No current facility-administered medications for this visit.    OBJECTIVE: Middle-aged white woman who appears stated age 58 Vitals:   05/30/13 0920  BP: 123/75  Pulse: 110  Temp: 99.4 F (37.4 C)  Resp: 18     Body mass index is 40.94 kg/(m^2).    ECOG FS: 1 Filed Weights   05/30/13 0920  Weight: 223 lb 14.4 oz (101.56 kg)   Sclerae unicteric, pupils equal and reactive Oropharynx clear and moist-- no thrush No cervical or supraclavicular adenopathy Lungs no rales or rhonchi Heart regular rate and rhythm Abd soft, nontender, positive bowel sounds no rebound no CVA tenderness MSK no focal spinal tenderness, no upper extremity lymphedema Neuro: nonfocal, well oriented, appropriate affect Breasts:   Barely palpate  mass in the right breast. There is no skin or nipple change of concern. The right axilla is benign. The left breast is unremarkable  LAB RESULTS: WBC 3.1 HCt 27.5 ANC 2.4 plat 107  Ua moderate blood  STUDIES: Dg Chest 2 View  05/19/2013   CLINICAL DATA:  Shortness of breath; history of breast carcinoma  EXAM: CHEST  2 VIEW  COMPARISON:  March 23, 2013  FINDINGS: Central catheter tip is in the superior vena cava near the junction with the right atrium. No pneumothorax. There is a calcified granuloma in the left lower lobe. Elsewhere, lungs are clear. Heart size and pulmonary vascularity are normal. No adenopathy. No bone lesions. There is mild degenerative change in the thoracic spine.  IMPRESSION: No edema or consolidation. Granuloma left base. Port-A-Cath as described without pneumothorax.   Electronically Signed   By: Lowella Grip M.D.   On: 05/19/2013 10:35   Mr Breast Bilateral W Wo Contrast  05/17/2013   CLINICAL DATA:  Assess response to neoadjuvant chemotherapy.  EXAM: BILATERAL BREAST MRI WITH AND WITHOUT CONTRAST  TECHNIQUE: Multiplanar, multisequence MR images of both breasts were obtained prior to and  following the intravenous  administration of 84m of MultiHance  THREE-DIMENSIONAL MR IMAGE RENDERING ON INDEPENDENT WORKSTATION:  Three-dimensional MR images were rendered by post-processing of the original MR data on an independent workstation. The three-dimensional MR images were interpreted, and findings are reported in the following complete MRI report for this study. Three dimensional images were evaluated at the independent DynaCad workstation  COMPARISON:  Previous breast MRI dated 03/08/2013.  FINDINGS: Breast composition:  Density b:  Scattered fibroglandular.  Background parenchymal enhancement: Mild  Right breast: There has been significant interval decrease in the size and enhancement of the previously described irregular enhancing mass located within the upper outer quadrant of the right breast. Only a small amount of non mass enhancement remains in the region. A measurable discrete mass is no longer present. There are no new findings within the right breast.  Left breast: No mass or abnormal enhancement.  Lymph nodes: No abnormal appearing lymph nodes.  Ancillary findings:  None.  IMPRESSION: Marked decrease in size of the a pre-existing irregularly enhancing mass located within the upper outer quadrant of the right breast as discussed above. No other significant change.  RECOMMENDATION: Treatment plan.  BI-RADS CATEGORY  6: Known biopsy-proven malignancy - appropriate action should be taken.   Electronically Signed   By: RLuberta RobertsonM.D.   On: 05/17/2013 14:03      ASSESSMENT: 58y.o. 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  (2)  being treated in the neoadjuvant setting,  She  completd  4 dose dense cycles of doxorubicin/cyclophosphamide, first dose on 03/31/2013. Cycle 4 was delayed one week because of bad weather. She received Neulasta on day 2  for granulocyte support.   This regimen will be followed by 12 weekly doses of carboplatin/paclitaxel prior to definitive surgery.   (3)  genetics counseling pending  PLAN: Breast cancer as above S/P cycle #4 of AC.  Neutropenic fever most likely source kidneys.Patient no longer neutropenic but continues febrile on double antibiotic coverage.Passed Kidney stone few days ago.  Obtain Kidney U/S. UA/Urine culture due to persistent fever.   Patient with history of recurrent kidney stones and given symptomatology I suspect kidney infection  due to underlying kidney stone.  Urology evaluation.   Will follow up as outpatient  Next week and if stable will resume planned chemotherapy.      Once she recovers from this treatment, she will start her weekly carboplatin/paclitaxel doses. Today we discussed the possible toxicities, side effects and complications of these treatments which hopefully will be milder as far as her symptoms are concerned. She understands one concern regarding treatment with Taxol is peripheral neuropathy, which may not develop at all, may develop and been mild and reversible, or may be permanent. We will watch her closely regarding that specific symptom among others  Patient has a good understanding of the overall plan. She agrees with it. She will call with any problems that may develop before her next visit here.   FAmada Kingfisher MD   06/05/2013 10:37 PM

## 2013-06-07 ENCOUNTER — Telehealth: Payer: Self-pay | Admitting: *Deleted

## 2013-06-07 ENCOUNTER — Telehealth: Payer: Self-pay | Admitting: Genetic Counselor

## 2013-06-07 ENCOUNTER — Encounter: Payer: Self-pay | Admitting: Genetic Counselor

## 2013-06-07 NOTE — Telephone Encounter (Signed)
Per staff message and POF I have scheduled appts.  JMW  

## 2013-06-07 NOTE — Telephone Encounter (Signed)
Left VM with good news on genetic test results

## 2013-06-07 NOTE — Telephone Encounter (Signed)
Revealed negative genetic testing on Breast/Ovarian cancer panel 

## 2013-06-09 ENCOUNTER — Ambulatory Visit (HOSPITAL_BASED_OUTPATIENT_CLINIC_OR_DEPARTMENT_OTHER): Payer: BC Managed Care – PPO

## 2013-06-09 ENCOUNTER — Encounter: Payer: Self-pay | Admitting: Physician Assistant

## 2013-06-09 ENCOUNTER — Other Ambulatory Visit: Payer: Self-pay | Admitting: Oncology

## 2013-06-09 ENCOUNTER — Other Ambulatory Visit (HOSPITAL_BASED_OUTPATIENT_CLINIC_OR_DEPARTMENT_OTHER): Payer: BC Managed Care – PPO

## 2013-06-09 ENCOUNTER — Ambulatory Visit (HOSPITAL_BASED_OUTPATIENT_CLINIC_OR_DEPARTMENT_OTHER): Payer: BC Managed Care – PPO | Admitting: Physician Assistant

## 2013-06-09 VITALS — BP 138/88 | HR 78 | Temp 97.4°F | Resp 20

## 2013-06-09 VITALS — BP 144/80 | HR 92 | Temp 98.7°F | Resp 18 | Ht 62.0 in | Wt 222.1 lb

## 2013-06-09 DIAGNOSIS — N200 Calculus of kidney: Secondary | ICD-10-CM

## 2013-06-09 DIAGNOSIS — N390 Urinary tract infection, site not specified: Secondary | ICD-10-CM

## 2013-06-09 DIAGNOSIS — F419 Anxiety disorder, unspecified: Secondary | ICD-10-CM

## 2013-06-09 DIAGNOSIS — M549 Dorsalgia, unspecified: Secondary | ICD-10-CM

## 2013-06-09 DIAGNOSIS — R300 Dysuria: Secondary | ICD-10-CM

## 2013-06-09 DIAGNOSIS — Z171 Estrogen receptor negative status [ER-]: Secondary | ICD-10-CM

## 2013-06-09 DIAGNOSIS — C50419 Malignant neoplasm of upper-outer quadrant of unspecified female breast: Secondary | ICD-10-CM

## 2013-06-09 DIAGNOSIS — C50411 Malignant neoplasm of upper-outer quadrant of right female breast: Secondary | ICD-10-CM

## 2013-06-09 DIAGNOSIS — Z5111 Encounter for antineoplastic chemotherapy: Secondary | ICD-10-CM

## 2013-06-09 DIAGNOSIS — D649 Anemia, unspecified: Secondary | ICD-10-CM

## 2013-06-09 DIAGNOSIS — M2550 Pain in unspecified joint: Secondary | ICD-10-CM

## 2013-06-09 LAB — URINALYSIS, MICROSCOPIC - CHCC
Bilirubin (Urine): NEGATIVE
Blood: NEGATIVE
Glucose: NEGATIVE mg/dL
Ketones: NEGATIVE mg/dL
NITRITE: NEGATIVE
PROTEIN: NEGATIVE mg/dL
SPECIFIC GRAVITY, URINE: 1.025 (ref 1.003–1.035)
UROBILINOGEN UR: 0.2 mg/dL (ref 0.2–1)
pH: 6 (ref 4.6–8.0)

## 2013-06-09 LAB — COMPREHENSIVE METABOLIC PANEL (CC13)
ALBUMIN: 3.5 g/dL (ref 3.5–5.0)
ALT: 40 U/L (ref 0–55)
AST: 25 U/L (ref 5–34)
Alkaline Phosphatase: 73 U/L (ref 40–150)
Anion Gap: 12 mEq/L — ABNORMAL HIGH (ref 3–11)
BILIRUBIN TOTAL: 0.43 mg/dL (ref 0.20–1.20)
BUN: 10.9 mg/dL (ref 7.0–26.0)
CO2: 21 meq/L — AB (ref 22–29)
Calcium: 9.4 mg/dL (ref 8.4–10.4)
Chloride: 112 mEq/L — ABNORMAL HIGH (ref 98–109)
Creatinine: 0.8 mg/dL (ref 0.6–1.1)
GLUCOSE: 93 mg/dL (ref 70–140)
Potassium: 4 mEq/L (ref 3.5–5.1)
Sodium: 145 mEq/L (ref 136–145)
Total Protein: 6.5 g/dL (ref 6.4–8.3)

## 2013-06-09 LAB — CBC WITH DIFFERENTIAL/PLATELET
BASO%: 1.3 % (ref 0.0–2.0)
BASOS ABS: 0 10*3/uL (ref 0.0–0.1)
EOS%: 0.6 % (ref 0.0–7.0)
Eosinophils Absolute: 0 10*3/uL (ref 0.0–0.5)
HEMATOCRIT: 30.3 % — AB (ref 34.8–46.6)
HGB: 9.8 g/dL — ABNORMAL LOW (ref 11.6–15.9)
LYMPH%: 15.4 % (ref 14.0–49.7)
MCH: 29.8 pg (ref 25.1–34.0)
MCHC: 32.3 g/dL (ref 31.5–36.0)
MCV: 92.1 fL (ref 79.5–101.0)
MONO#: 0.3 10*3/uL (ref 0.1–0.9)
MONO%: 10.3 % (ref 0.0–14.0)
NEUT#: 2.3 10*3/uL (ref 1.5–6.5)
NEUT%: 72.4 % (ref 38.4–76.8)
PLATELETS: 374 10*3/uL (ref 145–400)
RBC: 3.29 10*6/uL — ABNORMAL LOW (ref 3.70–5.45)
RDW: 19.5 % — ABNORMAL HIGH (ref 11.2–14.5)
WBC: 3.1 10*3/uL — ABNORMAL LOW (ref 3.9–10.3)
lymph#: 0.5 10*3/uL — ABNORMAL LOW (ref 0.9–3.3)
nRBC: 1 % — ABNORMAL HIGH (ref 0–0)

## 2013-06-09 MED ORDER — ONDANSETRON 16 MG/50ML IVPB (CHCC)
16.0000 mg | Freq: Once | INTRAVENOUS | Status: AC
  Administered 2013-06-09: 16 mg via INTRAVENOUS

## 2013-06-09 MED ORDER — DIPHENHYDRAMINE HCL 50 MG/ML IJ SOLN
INTRAMUSCULAR | Status: AC
  Filled 2013-06-09: qty 1

## 2013-06-09 MED ORDER — FAMOTIDINE IN NACL 20-0.9 MG/50ML-% IV SOLN
INTRAVENOUS | Status: AC
  Filled 2013-06-09: qty 50

## 2013-06-09 MED ORDER — ONDANSETRON 16 MG/50ML IVPB (CHCC)
INTRAVENOUS | Status: AC
  Filled 2013-06-09: qty 16

## 2013-06-09 MED ORDER — FAMOTIDINE IN NACL 20-0.9 MG/50ML-% IV SOLN
20.0000 mg | Freq: Once | INTRAVENOUS | Status: AC
  Administered 2013-06-09: 20 mg via INTRAVENOUS

## 2013-06-09 MED ORDER — NITROFURANTOIN MONOHYD MACRO 100 MG PO CAPS: 100.0000 mg | ORAL_CAPSULE | Freq: Two times a day (BID) | ORAL | Status: DC

## 2013-06-09 MED ORDER — SODIUM CHLORIDE 0.9 % IV SOLN
264.4000 mg | Freq: Once | INTRAVENOUS | Status: AC
  Administered 2013-06-09: 260 mg via INTRAVENOUS
  Filled 2013-06-09: qty 26

## 2013-06-09 MED ORDER — DEXAMETHASONE SODIUM PHOSPHATE 20 MG/5ML IJ SOLN
20.0000 mg | Freq: Once | INTRAMUSCULAR | Status: AC
  Administered 2013-06-09: 20 mg via INTRAVENOUS

## 2013-06-09 MED ORDER — DEXAMETHASONE SODIUM PHOSPHATE 20 MG/5ML IJ SOLN
INTRAMUSCULAR | Status: AC
  Filled 2013-06-09: qty 5

## 2013-06-09 MED ORDER — HEPARIN SOD (PORK) LOCK FLUSH 100 UNIT/ML IV SOLN
500.0000 [IU] | Freq: Once | INTRAVENOUS | Status: AC | PRN
  Administered 2013-06-09: 500 [IU]
  Filled 2013-06-09: qty 5

## 2013-06-09 MED ORDER — SODIUM CHLORIDE 0.9 % IV SOLN
Freq: Once | INTRAVENOUS | Status: AC
  Administered 2013-06-09: 11:00:00 via INTRAVENOUS

## 2013-06-09 MED ORDER — SODIUM CHLORIDE 0.9 % IJ SOLN
10.0000 mL | INTRAMUSCULAR | Status: DC | PRN
  Administered 2013-06-09: 10 mL
  Filled 2013-06-09: qty 10

## 2013-06-09 MED ORDER — SODIUM CHLORIDE 0.9 % IV SOLN
80.0000 mg/m2 | Freq: Once | INTRAVENOUS | Status: AC
  Administered 2013-06-09: 168 mg via INTRAVENOUS
  Filled 2013-06-09: qty 28

## 2013-06-09 MED ORDER — DIPHENHYDRAMINE HCL 50 MG/ML IJ SOLN
25.0000 mg | Freq: Once | INTRAMUSCULAR | Status: AC
  Administered 2013-06-09: 25 mg via INTRAVENOUS

## 2013-06-09 NOTE — Patient Instructions (Signed)
[facility] Discharge Instructions for Patients Receiving Chemotherapy  Today you received the following chemotherapy agents: Taxol and Carboplatin.  To help prevent nausea and vomiting after your treatment, we encourage you to take your nausea medication as prescribed.   If you develop nausea and vomiting that is not controlled by your nausea medication, call the clinic.   BELOW ARE SYMPTOMS THAT SHOULD BE REPORTED IMMEDIATELY:  *FEVER GREATER THAN 100.5 F  *CHILLS WITH OR WITHOUT FEVER  NAUSEA AND VOMITING THAT IS NOT CONTROLLED WITH YOUR NAUSEA MEDICATION  *UNUSUAL SHORTNESS OF BREATH  *UNUSUAL BRUISING OR BLEEDING  TENDERNESS IN MOUTH AND THROAT WITH OR WITHOUT PRESENCE OF ULCERS  *URINARY PROBLEMS  *BOWEL PROBLEMS  UNUSUAL RASH Items with * indicate a potential emergency and should be followed up as soon as possible.  Feel free to call the clinic you have any questions or concerns. The clinic phone number is [phone].    

## 2013-06-10 ENCOUNTER — Telehealth: Payer: Self-pay | Admitting: *Deleted

## 2013-06-10 LAB — URINE CULTURE

## 2013-06-10 NOTE — Progress Notes (Signed)
Sydney Ford  [phone] [phone]     ID: Sydney Ford OB: October 30, 1955  MR#: [id number]  EVO#:[id number]  PCP: Dwan Bolt, MD GYN:   SU: Rolm Bookbinder, MD OTHER MD: Ulyess Blossom, MD  CHIEF COMPLAINT:  Right breast cancer/neoadjuvant chemotherapy   HISTORY OF PRESENT ILLNESS: Sydney Ford (pronounced "line 'em") herself noted a change in her right breast sometime October 2014 but "my breasts are like bean bags" and she initially ignored it. After while she began to feel a pole when she lay on her right side at night so she scheduled herself for screening mammography at [facility] 02/08/2013 (prior mammogram had been April 2011), and this did show a possible mass in the right breast. Unilateral right diagnostic mammography and ultrasonography 02/28/2013 confirmed an irregular mass in the right breast upper outer quadrant measuring 2.4 cm. This was palpable at the 11:00 position. Ultrasound showed an irregular hypoechoic mass measuring 1.5 cm with no abnormal adenopathy in the right axilla.  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  The patient met with Dr. Donne Hazel 03/14/2013 and he recommended primary systemic chemotherapy to decrease the size of the tumor and increase the chance of lumpectomy.   Subsequent history is as detailed below.  INTERVAL HISTORY: Sydney Ford returns alone today for followup of her locally advanced right breast carcinoma.  She completed her 4 dose dense cycles of neoadjuvant doxorubicin/cyclophosphamide, her final dose given on 05/19/2013.  She developed neutropenia following cycle 4 and subsequently had some low-grade fevers while on an antibiotic.  We held her first cycle of weekly paclitaxel/carboplatin last  week to give her an extra week to recover.  She is here today to consider initiating her first of 12 planned weekly cycles.  Sydney Ford is feeling much better today.  She has had no additional fevers.  She completed her course of Augmentin 1 week ago.  Her counts have recovered well. Her energy level has improved significantly.   Her only complaint today is some mild dysuria, but no evidence of hematuria.  REVIEW OF SYSTEMS: Sydney Ford denies any fevers or chills.  She has had no rashes or skin changes and has had no abnormal bruising or bleeding. Her appetite is better and she currently denies any significant nausea, only occasional queasiness.  She has had no emesis. She's having regular bowel movements. She denies any increased cough. She has shortness of breath with exertion which is stable, but denies any orthopnea, peripheral swelling, chest pain or palpitations. Sydney Ford also denies any abnormal headaches or dizziness. Her vision is more blurry than usual. Currently, she denies any new or unusual myalgias, arthralgias, or bony pain. She does have some chronic back pain and joint pain, both of which are stable. At baseline, she also denies any peripheral neuropathy.  A detailed review of systems is otherwise stable and noncontributory.    PAST MEDICAL HISTORY: Past Medical History  Diagnosis Date  . Hypertension   . Hyperlipidemia   . Thyroid disease   . Osteoporosis   . Hypothyroidism     hashimoto's  . Depression   . Pneumonia     2014  . Cold (disease) 03/21/12    couple of weeks ago - pt states much better now  . History of kidney stones     "passed 9  stones"  . GERD (gastroesophageal reflux disease)     no meds except occas tums  . Headache(784.0)     occas migraine  . Cancer     new dx of right breast cancer - planning chemo first  . Arthritis     oa; lower back pain - hx of injury yrs ago-occas flare ups  . Anemia     2005 - related to heavy menses with the thyroid probl    PAST  SURGICAL HISTORY: Past Surgical History  Procedure Laterality Date  . Cesarean section  1987  . Tonsillectomy    . Uterine ablation  2008    no menstrual periods since procedure  . Portacath placement N/A 03/23/2013    Procedure: INSERTION PORT-A-CATH;  Surgeon: Rolm Bookbinder, MD;  Location: [facility];  Service: General;  Laterality: N/A;    FAMILY HISTORY Family History  Problem Relation Age of Onset  . Heart failure Mother   . Heart attack Father   . Autoimmune disease Maternal Aunt     lupus, scelraderma, reynolds disease, and others  . Stroke Paternal Uncle   . Heart failure Maternal Grandmother   . Heart attack Maternal Grandfather   . Throat cancer Paternal Grandmother     dx late 35s; non smoker or drinker  . Heart attack Paternal Grandfather   . Schizophrenia Son 58  . Heart attack Paternal Uncle    the patient's father died at the age of 85 from a myocardial infarction. The patient's mother died at the age of 8 with congestive heart failure. She also had a history of lupus. The patient had no brothers or sisters. There is no history of breast or ovarian cancer in the family to her knowledge.  GYNECOLOGIC HISTORY:  Menarche age 37, first live birth age 16. She is GX P2. She underwent endometrial ablation in 2009 and has not had a period since that time. She never took hormone replacement. She did use birth control pills remotely with no complications  SOCIAL HISTORY: (Updated 06/02/2013) Sydney Ford works at [employer] for a variety of long-term clients. She does not work for a company. Her husband [name] is semiretired. He works for a company that [employer] data on [place]. Son [name] is in [place] and works in Architect. Son [name] lives at home and is disabled with a diagnosis of paranoid schizophrenia. The patient has no grandchildren. She is not a church attender    ADVANCED DIRECTIVES: Not in place   HEALTH MAINTENANCE: (Updated  06/09/2013) History  Substance Use Topics  . Smoking status: Former Smoker    Quit date: 03/17/1985  . Smokeless tobacco: Never Used  . Alcohol Use: No     Colonoscopy: Never  PAP: Remote  Bone density: November 2014, showed osteoporosis   according to the patient's report  Lipid panel:  Not on file, Dr. Wilson Singer   Allergies  Allergen Reactions  . Codeine Itching and Nausea Only    REACTION: Nausea and itching  . Compazine [Prochlorperazine Edisylate] Other (See Comments)    Head aches   . Other     Close contact with Some recycled plastics cause whelps  . Tape Rash    Needs OPSITE dressing with port access    Current Outpatient Prescriptions  Medication Sig Dispense Refill  . atorvastatin (LIPITOR) 20 MG tablet Take 20 mg by mouth every evening.       . Cholecalciferol (VITAMIN D) 2000 UNITS CAPS Take 6,000 Units by  mouth every morning.      . lidocaine-prilocaine (EMLA) cream Apply 1 application topically as needed.  30 g  0  . sertraline (ZOLOFT) 100 MG tablet Take 1 tablet (100 mg total) by mouth at bedtime.  30 tablet  1  . thyroid (ARMOUR) 90 MG tablet Take 90 mg by mouth every morning.      . traMADol (ULTRAM) 50 MG tablet Take 1-2 tablets (50-100 mg total) by mouth 3 (three) times daily as needed for moderate pain or severe pain.  100 tablet  0  . traZODone (DESYREL) 50 MG tablet Take 1 tablet (50 mg total) by mouth at bedtime as needed for sleep.  30 tablet  1  . cholestyramine (QUESTRAN) 4 G packet       . dexamethasone (DECADRON) 4 MG tablet Take 4 mg by mouth as needed (Starts daily on Friday - Monday after Thursdays chemo treatment).       . LORazepam (ATIVAN) 0.5 MG tablet Take 0.5 mg by mouth every 8 (eight) hours as needed for anxiety (or nausea).       . meloxicam (MOBIC) 15 MG tablet Take 15 mg by mouth daily as needed for pain.       . nitrofurantoin, macrocrystal-monohydrate, (MACROBID) 100 MG capsule Take 1 capsule (100 mg total) by mouth 2 (two) times daily.  X 10 days  20 capsule  0  . omeprazole (PRILOSEC) 40 MG capsule Take 1 capsule (40 mg total) by mouth daily.  30 capsule  4  . ondansetron (ZOFRAN) 8 MG tablet Take 8 mg by mouth every 8 (eight) hours as needed for nausea.       Marland Kitchen oxyCODONE-acetaminophen (PERCOCET/ROXICET) 5-325 MG per tablet       . prochlorperazine (COMPAZINE) 10 MG tablet       . venlafaxine (EFFEXOR) 37.5 MG tablet        No current facility-administered medications for this visit.    OBJECTIVE: Middle-aged white woman in no acute distress Filed Vitals:   06/09/13 0939  BP: 144/80  Pulse: 92  Temp: 98.7 F (37.1 C)  Resp: 18     Body mass index is 40.61 kg/(m^2).    ECOG FS: 1 Filed Weights   06/09/13 0939  Weight: 222 lb 1.6 oz (100.744 kg)   Physical Exam: HEENT:  Sclerae anicteric.  Oropharynx clear and moist. No ulcerations or mucositis noted. No thrush. Neck supple, trachea midline.  NODES:  No cervical or supraclavicular lymphadenopathy palpated.  BREAST EXAM:  Deferred. Axillae are benign bilaterally, with no palpable lymphadenopathy. LUNGS:  Clear to auscultation bilaterally with good excursion. No dullness to percussion.  No crackles, wheezes or rhonchi HEART:  Regular rate and rhythm. No murmur. ABDOMEN:  Soft, obese, nontender.  Positive bowel sounds.  MSK:  No focal spinal tenderness to palpation. Good range of motion bilaterally in the upper extremities. EXTREMITIES:  No peripheral edema.   SKIN:  Benign with no visible skin changes or lesions. No nail dyscrasia. No excessive ecchymoses. No petechiae. No pallor. NEURO:  Nonfocal. Well oriented.  Appropriate affect.   LAB RESULTS:  Lab Results  Component Value Date   WBC 3.1* 06/09/2013   NEUTROABS 2.3 06/09/2013   HGB 9.8* 06/09/2013   HCT 30.3* 06/09/2013   MCV 92.1 06/09/2013   PLT 374 06/09/2013      Chemistry      Component Value Date/Time   NA 145 06/09/2013 0847   NA 142 05/27/2013 0530   K 4.0  06/09/2013 0847   K 4.6 05/27/2013 0530    CL 104 05/27/2013 0530   CO2 21* 06/09/2013 0847   CO2 28 05/27/2013 0530   BUN 10.9 06/09/2013 0847   BUN 15 05/27/2013 0530   CREATININE 0.8 06/09/2013 0847   CREATININE 0.94 05/27/2013 0530      Component Value Date/Time   CALCIUM 9.4 06/09/2013 0847   CALCIUM 8.8 05/27/2013 0530   ALKPHOS 73 06/09/2013 0847   ALKPHOS 65 05/27/2013 0530   AST 25 06/09/2013 0847   AST 7 05/27/2013 0530   ALT 40 06/09/2013 0847   ALT 11 05/27/2013 0530   BILITOT 0.43 06/09/2013 0847   BILITOT 0.5 05/27/2013 0530       STUDIES:   Mr Breast Bilateral W Wo Contrast 05/17/2013   CLINICAL DATA:  Assess response to neoadjuvant chemotherapy.  EXAM: BILATERAL BREAST MRI WITH AND WITHOUT CONTRAST  TECHNIQUE: Multiplanar, multisequence MR images of both breasts were obtained prior to and following the intravenous administration of 80m of MultiHance  THREE-DIMENSIONAL MR IMAGE RENDERING ON INDEPENDENT WORKSTATION:  Three-dimensional MR images were rendered by post-processing of the original MR data on an independent workstation. The three-dimensional MR images were interpreted, and findings are reported in the following complete MRI report for this study. Three dimensional images were evaluated at the independent DynaCad workstation  COMPARISON:  Previous breast MRI dated 03/08/2013.  FINDINGS: Breast composition:  Density b:  Scattered fibroglandular.  Background parenchymal enhancement: Mild  Right breast: There has been significant interval decrease in the size and enhancement of the previously described irregular enhancing mass located within the upper outer quadrant of the right breast. Only a small amount of non mass enhancement remains in the region. A measurable discrete mass is no longer present. There are no new findings within the right breast.  Left breast: No mass or abnormal enhancement.  Lymph nodes: No abnormal appearing lymph nodes.  Ancillary findings:  None.  IMPRESSION: Marked decrease in size of the a  pre-existing irregularly enhancing mass located within the upper outer quadrant of the right breast as discussed above. No other significant change.  RECOMMENDATION: Treatment plan.  BI-RADS CATEGORY  6: Known biopsy-proven malignancy - appropriate action should be taken.   Electronically Signed   By: RLuberta RobertsonM.D.   On: 05/17/2013 14:03      ASSESSMENT: 58y.o. 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  (2)  being treated in the neoadjuvant setting, the plan being to complete 4 dose dense cycles of doxorubicin/cyclophosphamide, first dose on 03/31/2013 and final dose on 05/19/2013. Cycle 4 was delayed one week because of bad weather.  Neulasta on day 2 for granulocyte support.   (3)  doxorubicin/cyclophosphamide is being followed by 12 weekly doses of carboplatin/paclitaxel prior to definitive surgery, first dose 06/09/2013.  (3)  genetics counseling pending  PLAN: Sydney Ford will receive her first weekly cycle of carboplatin/paclitaxel today as scheduled.  She has all her anti-nausea medications on hand at home and knows how to utilize them appropriately with this new regimen.  We will follow her closely over the next 12 weeks, and I will see her again next week on April 1, prior to her second cycle on April 2.  We will recheck a urinalysis today to make sure there is no residual sign of UTI and will treat as  needed.  She will also followup with her urologist if needed for the nonobstructing renal stones.  All this was reviewed in detail with Sydney Ford today. She voices understanding and agreement with our plan, and will call for any changes or problems prior to her appointment here next week.   Kalab Camps, PA-C    06/09/2013

## 2013-06-10 NOTE — Telephone Encounter (Signed)
NO PROBLEMS OR QUESTIONS AT THIS TIME. PT. WILL CALL THIS OFFICE IF THE NEED ARISES. 

## 2013-06-15 ENCOUNTER — Telehealth: Payer: Self-pay | Admitting: Physician Assistant

## 2013-06-15 ENCOUNTER — Emergency Department (HOSPITAL_COMMUNITY)
Admission: EM | Admit: 2013-06-15 | Discharge: 2013-06-15 | Disposition: A | Discharge status: Home / Self Care | Payer: BC Managed Care – PPO | Attending: Emergency Medicine | Admitting: Emergency Medicine

## 2013-06-15 ENCOUNTER — Emergency Department (HOSPITAL_COMMUNITY): Payer: BC Managed Care – PPO

## 2013-06-15 ENCOUNTER — Encounter (HOSPITAL_COMMUNITY): Payer: Self-pay | Admitting: Emergency Medicine

## 2013-06-15 ENCOUNTER — Encounter: Payer: Self-pay | Admitting: Oncology

## 2013-06-15 ENCOUNTER — Other Ambulatory Visit (HOSPITAL_BASED_OUTPATIENT_CLINIC_OR_DEPARTMENT_OTHER): Payer: BC Managed Care – PPO

## 2013-06-15 ENCOUNTER — Ambulatory Visit (HOSPITAL_BASED_OUTPATIENT_CLINIC_OR_DEPARTMENT_OTHER): Payer: BC Managed Care – PPO | Admitting: Physician Assistant

## 2013-06-15 ENCOUNTER — Encounter: Payer: Self-pay | Admitting: Physician Assistant

## 2013-06-15 VITALS — BP 161/66 | HR 78 | Temp 98.5°F | Resp 18 | Ht 62.0 in | Wt 221.8 lb

## 2013-06-15 DIAGNOSIS — Z8701 Personal history of pneumonia (recurrent): Secondary | ICD-10-CM | POA: Insufficient documentation

## 2013-06-15 DIAGNOSIS — Z9889 Other specified postprocedural states: Secondary | ICD-10-CM | POA: Insufficient documentation

## 2013-06-15 DIAGNOSIS — F329 Major depressive disorder, single episode, unspecified: Secondary | ICD-10-CM | POA: Insufficient documentation

## 2013-06-15 DIAGNOSIS — K219 Gastro-esophageal reflux disease without esophagitis: Secondary | ICD-10-CM | POA: Insufficient documentation

## 2013-06-15 DIAGNOSIS — C50919 Malignant neoplasm of unspecified site of unspecified female breast: Secondary | ICD-10-CM | POA: Insufficient documentation

## 2013-06-15 DIAGNOSIS — Z862 Personal history of diseases of the blood and blood-forming organs and certain disorders involving the immune mechanism: Secondary | ICD-10-CM | POA: Insufficient documentation

## 2013-06-15 DIAGNOSIS — C50419 Malignant neoplasm of upper-outer quadrant of unspecified female breast: Secondary | ICD-10-CM

## 2013-06-15 DIAGNOSIS — Z171 Estrogen receptor negative status [ER-]: Secondary | ICD-10-CM

## 2013-06-15 DIAGNOSIS — C50411 Malignant neoplasm of upper-outer quadrant of right female breast: Secondary | ICD-10-CM

## 2013-06-15 DIAGNOSIS — N200 Calculus of kidney: Secondary | ICD-10-CM

## 2013-06-15 DIAGNOSIS — D649 Anemia, unspecified: Secondary | ICD-10-CM

## 2013-06-15 DIAGNOSIS — Z8739 Personal history of other diseases of the musculoskeletal system and connective tissue: Secondary | ICD-10-CM | POA: Insufficient documentation

## 2013-06-15 DIAGNOSIS — F419 Anxiety disorder, unspecified: Secondary | ICD-10-CM

## 2013-06-15 DIAGNOSIS — R112 Nausea with vomiting, unspecified: Secondary | ICD-10-CM

## 2013-06-15 DIAGNOSIS — F3289 Other specified depressive episodes: Secondary | ICD-10-CM | POA: Insufficient documentation

## 2013-06-15 DIAGNOSIS — IMO0002 Reserved for concepts with insufficient information to code with codable children: Secondary | ICD-10-CM | POA: Insufficient documentation

## 2013-06-15 DIAGNOSIS — Z79899 Other long term (current) drug therapy: Secondary | ICD-10-CM | POA: Insufficient documentation

## 2013-06-15 DIAGNOSIS — Z87891 Personal history of nicotine dependence: Secondary | ICD-10-CM | POA: Insufficient documentation

## 2013-06-15 DIAGNOSIS — E063 Autoimmune thyroiditis: Secondary | ICD-10-CM | POA: Insufficient documentation

## 2013-06-15 DIAGNOSIS — M129 Arthropathy, unspecified: Secondary | ICD-10-CM | POA: Insufficient documentation

## 2013-06-15 DIAGNOSIS — G43909 Migraine, unspecified, not intractable, without status migrainosus: Secondary | ICD-10-CM | POA: Insufficient documentation

## 2013-06-15 DIAGNOSIS — E785 Hyperlipidemia, unspecified: Secondary | ICD-10-CM | POA: Insufficient documentation

## 2013-06-15 DIAGNOSIS — I10 Essential (primary) hypertension: Secondary | ICD-10-CM | POA: Insufficient documentation

## 2013-06-15 LAB — URINALYSIS, ROUTINE W REFLEX MICROSCOPIC
Bilirubin Urine: NEGATIVE
GLUCOSE, UA: NEGATIVE mg/dL
KETONES UR: NEGATIVE mg/dL
LEUKOCYTES UA: NEGATIVE
Nitrite: NEGATIVE
PH: 5 (ref 5.0–8.0)
Protein, ur: NEGATIVE mg/dL
Specific Gravity, Urine: 1.009 (ref 1.005–1.030)
Urobilinogen, UA: 0.2 mg/dL (ref 0.0–1.0)

## 2013-06-15 LAB — URINE MICROSCOPIC-ADD ON

## 2013-06-15 LAB — CBC WITH DIFFERENTIAL/PLATELET
BASO%: 1.4 % (ref 0.0–2.0)
BASOS ABS: 0.1 10*3/uL (ref 0.0–0.1)
EOS ABS: 0 10*3/uL (ref 0.0–0.5)
EOS%: 0.9 % (ref 0.0–7.0)
HEMATOCRIT: 29.9 % — AB (ref 34.8–46.6)
HEMOGLOBIN: 10.1 g/dL — AB (ref 11.6–15.9)
LYMPH%: 12.2 % — AB (ref 14.0–49.7)
MCH: 30.8 pg (ref 25.1–34.0)
MCHC: 33.7 g/dL (ref 31.5–36.0)
MCV: 91.6 fL (ref 79.5–101.0)
MONO#: 0.4 10*3/uL (ref 0.1–0.9)
MONO%: 8.2 % (ref 0.0–14.0)
NEUT%: 77.3 % — ABNORMAL HIGH (ref 38.4–76.8)
NEUTROS ABS: 3.6 10*3/uL (ref 1.5–6.5)
PLATELETS: 276 10*3/uL (ref 145–400)
RBC: 3.26 10*6/uL — ABNORMAL LOW (ref 3.70–5.45)
RDW: 19.5 % — ABNORMAL HIGH (ref 11.2–14.5)
WBC: 4.6 10*3/uL (ref 3.9–10.3)
lymph#: 0.6 10*3/uL — ABNORMAL LOW (ref 0.9–3.3)

## 2013-06-15 MED ORDER — ONDANSETRON 8 MG PO TBDP
8.0000 mg | ORAL_TABLET | Freq: Once | ORAL | Status: AC
Start: 2013-06-15 — End: 2013-06-15
  Administered 2013-06-15: 8 mg via ORAL
  Filled 2013-06-15: qty 1

## 2013-06-15 MED ORDER — ONDANSETRON HCL 8 MG PO TABS: 8.0000 mg | ORAL_TABLET | Freq: Three times a day (TID) | ORAL | Status: DC | PRN

## 2013-06-15 MED ORDER — PROMETHAZINE HCL 25 MG RE SUPP: 25.0000 mg | Freq: Three times a day (TID) | RECTAL | Status: DC | PRN

## 2013-06-15 MED ORDER — TRAMADOL HCL 50 MG PO TABS
50.0000 mg | ORAL_TABLET | Freq: Once | ORAL | Status: AC
  Administered 2013-06-15: 50 mg via ORAL
  Filled 2013-06-15: qty 1

## 2013-06-15 NOTE — ED Provider Notes (Signed)
Medical screening examination/treatment/procedure(s) were performed by non-physician practitioner and as supervising physician I was immediately available for consultation/collaboration.   EKG Interpretation None       Merryl Hacker, MD 06/15/13 [phone]

## 2013-06-15 NOTE — ED Notes (Signed)
Patient transported to CT 

## 2013-06-15 NOTE — ED Provider Notes (Signed)
CSN: [id number]     Arrival date & time 06/15/13  0206 History   First MD Initiated Contact with Patient 06/15/13 0302     Chief Complaint  Patient presents with  . Abdominal Pain     (Consider location/radiation/quality/duration/timing/severity/associated sxs/prior Treatment) HPI History provided by pt.   Pt presents w/ RLQ pain since 10:30pm last night.  Severe intimally but improved w/ 2 tramadol and now describes as dull ache.  Mild pain in R flank as well.   No aggravating factors.  Associated w/ nausea.  Denies fever, change in bowels, urinary and vaginal sx.  H/o kidney stones, but usually presents w/ severe flank pain; has only had abdominal pain on one other occasion.  Past abd surgeries include hysterectomy only.  She is currently on chemotherapy for breast cancer and has chronic anorexia.  Past Medical History  Diagnosis Date  . Hypertension   . Hyperlipidemia   . Thyroid disease   . Osteoporosis   . Hypothyroidism     hashimoto's  . Depression   . Pneumonia     2014  . Cold (disease) 03/21/12    couple of weeks ago - pt states much better now  . History of kidney stones     "passed 9 stones"  . GERD (gastroesophageal reflux disease)     no meds except occas tums  . Headache(784.0)     occas migraine  . Cancer     new dx of right breast cancer - planning chemo first  . Arthritis     oa; lower back pain - hx of injury yrs ago-occas flare ups  . Anemia     2005 - related to heavy menses with the thyroid probl   Past Surgical History  Procedure Laterality Date  . Cesarean section  1987  . Tonsillectomy    . Uterine ablation  2008    no menstrual periods since procedure  . Portacath placement N/A 03/23/2013    Procedure: INSERTION PORT-A-CATH;  Surgeon: Rolm Bookbinder, MD;  Location: [facility];  Service: General;  Laterality: N/A;   Family History  Problem Relation Age of Onset  . Heart failure Mother   . Heart attack Father   . Autoimmune disease Maternal Aunt      lupus, scelraderma, reynolds disease, and others  . Stroke Paternal Uncle   . Heart failure Maternal Grandmother   . Heart attack Maternal Grandfather   . Throat cancer Paternal Grandmother     dx late 50s; non smoker or drinker  . Heart attack Paternal Grandfather   . Schizophrenia Son 9  . Heart attack Paternal Uncle    History  Substance Use Topics  . Smoking status: Former Smoker    Quit date: 03/17/1985  . Smokeless tobacco: Never Used  . Alcohol Use: No   OB History   Grav Para Term Preterm Abortions TAB SAB Ect Mult Living                 Review of Systems  All other systems reviewed and are negative.      Allergies  Codeine; Compazine; Other; and Tape  Home Medications   Current Outpatient Rx  Name  Route  Sig  Dispense  Refill  . atorvastatin (LIPITOR) 20 MG tablet   Oral   Take 20 mg by mouth every evening.          . Cholecalciferol (VITAMIN D) 2000 UNITS CAPS   Oral   Take 6,000 Units by mouth every  morning.         Marland Kitchen dexamethasone (DECADRON) 4 MG tablet   Oral   Take 4 mg by mouth as needed (Starts daily on Friday - Monday after Thursdays chemo treatment).          Marland Kitchen lidocaine-prilocaine (EMLA) cream   Topical   Apply 1 application topically daily as needed. port         . LORazepam (ATIVAN) 0.5 MG tablet   Oral   Take 0.5 mg by mouth every 8 (eight) hours as needed for anxiety (or nausea).          . nitrofurantoin, macrocrystal-monohydrate, (MACROBID) 100 MG capsule   Oral   Take 1 capsule (100 mg total) by mouth 2 (two) times daily. X 10 days   20 capsule   0   . omeprazole (PRILOSEC) 40 MG capsule   Oral   Take 1 capsule (40 mg total) by mouth daily.   30 capsule   4   . ondansetron (ZOFRAN) 8 MG tablet   Oral   Take 8 mg by mouth every 8 (eight) hours as needed for nausea.          . sertraline (ZOLOFT) 100 MG tablet   Oral   Take 1 tablet (100 mg total) by mouth at bedtime.   30 tablet   1   . thyroid  (ARMOUR) 90 MG tablet   Oral   Take 90 mg by mouth every morning.         . traMADol (ULTRAM) 50 MG tablet   Oral   Take 1-2 tablets (50-100 mg total) by mouth 3 (three) times daily as needed for moderate pain or severe pain.   100 tablet   0   . traZODone (DESYREL) 50 MG tablet   Oral   Take 1 tablet (50 mg total) by mouth at bedtime as needed for sleep.   30 tablet   1   . venlafaxine (EFFEXOR) 37.5 MG tablet   Oral   Take 37.5 mg by mouth 2 (two) times daily with a meal.           BP 147/86  Pulse 86  Temp(Src) 98.1 F (36.7 C) (Oral)  Resp 18  SpO2 100% Physical Exam  Nursing note and vitals reviewed. Constitutional: She is oriented to person, place, and time. She appears well-developed and well-nourished. No distress.  HENT:  Head: Normocephalic and atraumatic.  Eyes:  Normal appearance  Neck: Normal range of motion.  Cardiovascular: Normal rate and regular rhythm.   Pulmonary/Chest: Effort normal and breath sounds normal. No respiratory distress.  Abdominal: Soft. Bowel sounds are normal. She exhibits no distension and no mass. There is no tenderness. There is no rebound and no guarding.  Obese.    Genitourinary:  R CVA tenderness  Musculoskeletal: Normal range of motion.  Neurological: She is alert and oriented to person, place, and time.  Skin: Skin is warm and dry. No rash noted.  Psychiatric: She has a normal mood and affect. Her behavior is normal.    ED Course  Procedures (including critical care time) Labs Review Labs Reviewed  URINALYSIS, ROUTINE W REFLEX MICROSCOPIC   Imaging Review Ct Abdomen Pelvis Wo Contrast  06/15/2013   CLINICAL DATA:  Abdominal pain, history of kidney stones and breast cancer.  EXAM: CT ABDOMEN AND PELVIS WITHOUT CONTRAST  TECHNIQUE: Multidetector CT imaging of the abdomen and pelvis was performed following the standard protocol without intravenous contrast.  COMPARISON:  US  RENAL dated 05/30/2013  FINDINGS: Limited view  of the lung bases are clear. Included heart and pericardium are unremarkable.  Moderate right hydroureteronephrosis to the level of the mid ureter where a 7 x 6 mm calculus is seen. Multiple bilateral nephrolithiasis, including 6 mm right interpolar calculus, contiguous calculi in the right lower pole measuring 6 mm. 6 mm left lower pole renal calculus. No left hydronephrosis.  The liver, pancreas, gallbladder and adrenal glands are unremarkable. Coarse calcification in the spleen which is otherwise unremarkable.  Stomach, small and large bowel are normal in course and caliber without inflammatory changes. Normal appendix. No intraperitoneal free fluid or free air. Aortoiliac vessels are normal course and caliber. Internal reproductive organs are unremarkable. Urinary bladder is decompressed harboring no intravesicular calculi. Phleboliths in the pelvis.  Moderate degenerative changes of pubic symphysis. Grade 1 L5-S1 anterolisthesis on degenerative basis with severe L4-5 degenerative disc disease. Small fat containing umbilical hernia.  IMPRESSION: Moderate right hydroureteronephrosis to the level of the mid ureter where a 7 x 6 mm calculus is seen. Bilateral residual nephrolithiasis as described above. No left hydronephrosis.   Electronically Signed   By: Elon Alas   On: 06/15/2013 05:22     EKG Interpretation None      MDM   Final diagnoses:  Kidney stone    58yo F w/ breast cancer, presents w/ RLQ pain since 10:30pm; severe at onset but currently a dull ache.  Associated w/ nausea.  Has chronic anorexia associated w/ chemo.  Has h/o kidney stones but usually presents w/ severe flank pain.  She denies hematuria.  On exam, afebrile, NAD, abd benign, R CVA ttp.  Suspect nephrolithiasis and is more likely than appendicitis, but because these sx are somewhat atypical and pain located in RLQ, CT abd/pelvis ordered and is pending.  U/A ordered as well.  Pt declines pain/nausea medication at this  time.    CT shows 7x37mm stone R ureter w/ mod hydronephrosis.  U/A neg for infection.  Pt received single dose po ultram and zofran.  Sx controlled.  D/c'd home w/ referral to urology.  Return precautions discussed.    Remer Macho, PA-C 06/15/13 [phone]

## 2013-06-15 NOTE — ED Notes (Signed)
PA at bedside.

## 2013-06-15 NOTE — Progress Notes (Signed)
[place]  [phone] [phone]     ID: Sydney Ford OB: 1955-09-25  MR#: [id number]  FXJ#:[id number]  PCP: Dwan Bolt, MD GYN:   SU: Rolm Bookbinder, MD OTHER MD: Ulyess Blossom, MD  CHIEF COMPLAINT:  Right breast cancer/neoadjuvant chemotherapy   HISTORY OF PRESENT ILLNESS: Sydney Ford (pronounced "line 'em") herself noted a change in her right breast sometime October 2014 but "my breasts are like bean bags" and she initially ignored it. After while she began to feel a pole when she lay on her right side at night so she scheduled herself for screening mammography at the [facility] 02/08/2013 (prior mammogram had been April 2011), and this did show a possible mass in the right breast. Unilateral right diagnostic mammography and ultrasonography 02/28/2013 confirmed an irregular mass in the right breast upper outer quadrant measuring 2.4 cm. This was palpable at the 11:00 position. Ultrasound showed an irregular hypoechoic mass measuring 1.5 cm with no abnormal adenopathy in the right axilla.  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  The patient met with Dr. Donne Hazel 03/14/2013 and he recommended primary systemic chemotherapy to decrease the size of the tumor and increase the chance of lumpectomy.   Subsequent history is as detailed below.  INTERVAL HISTORY: Sydney Ford returns alone today for followup of her locally advanced right breast carcinoma.  She completed her 4 dose dense cycles of neoadjuvant doxorubicin/cyclophosphamide, her final dose given on 05/19/2013.  She is now receiving weekly carboplatin/paclitaxel, and is due for her second of 12 planned weekly doses tomorrow, 06/16/2013.  Interval history is notable for the fact that  Sydney Ford was in the emergency room last night for abdominal pain. A CT showed a 7 x 6 mm stone in the right ureter with moderate hydronephrosis. She was given pain medication and Zofran which control her symptoms, and currently, she denies any pain at all. She's had no additional dysuria and denies any obvious hematuria. She's had no fevers, chills, or night sweats.  With regards to the chemotherapy last week, Sydney Ford did not take her antinausea medication following treatment, and had some significant nausea and vomiting by Saturday. Fortunately this resolved by Sunday, and she had been feeling better until the episode last night.   REVIEW OF SYSTEMS: Sydney Ford's energy level is fairly low. She has had no rashes or skin changes and denies any abnormal bruising or bleeding. She's had no oral ulcerations or oral sensitivity. Her appetite is fair. She is trying to keep herself well hydrated. She currently denies any nausea, and she's had no change in bowel habits. She continues to have shortness of breath with exertion which is stable. She denies any increased cough, phlegm production, pleurisy, orthopnea, peripheral swelling, chest pain, or palpitations. She does have some back pain and flank pain that comes and goes, and again currently not a problem. She denies any additional myalgias, arthralgias, or bony pain other than her chronic back pain and joint pain which are stable. She's had no problems with peripheral neuropathy. She denies any abnormal headaches or dizziness.   A detailed review of systems is otherwise stable and noncontributory.    PAST MEDICAL HISTORY: Past Medical History  Diagnosis Date  . Hypertension   . Hyperlipidemia   . Thyroid disease   . Osteoporosis   . Hypothyroidism  hashimoto's  . Depression   . Pneumonia     2014  . Cold (disease) 03/21/12    couple of weeks ago - pt states much better now  . History of kidney stones     "passed 9 stones"  . GERD (gastroesophageal reflux  disease)     no meds except occas tums  . Headache(784.0)     occas migraine  . Cancer     new dx of right breast cancer - planning chemo first  . Arthritis     oa; lower back pain - hx of injury yrs ago-occas flare ups  . Anemia     2005 - related to heavy menses with the thyroid probl    PAST SURGICAL HISTORY: Past Surgical History  Procedure Laterality Date  . Cesarean section  1987  . Tonsillectomy    . Uterine ablation  2008    no menstrual periods since procedure  . Portacath placement N/A 03/23/2013    Procedure: INSERTION PORT-A-CATH;  Surgeon: Rolm Bookbinder, MD;  Location: [facility];  Service: General;  Laterality: N/A;    FAMILY HISTORY Family History  Problem Relation Age of Onset  . Heart failure Mother   . Heart attack Father   . Autoimmune disease Maternal Aunt     lupus, scelraderma, reynolds disease, and others  . Stroke Paternal Uncle   . Heart failure Maternal Grandmother   . Heart attack Maternal Grandfather   . Throat cancer Paternal Grandmother     dx late 33s; non smoker or drinker  . Heart attack Paternal Grandfather   . Schizophrenia Son 13  . Heart attack Paternal Uncle    the patient's father died at the age of 39 from a myocardial infarction. The patient's mother died at the age of 69 with congestive heart failure. She also had a history of lupus. The patient had no brothers or sisters. There is no history of breast or ovarian cancer in the family to her knowledge.  GYNECOLOGIC HISTORY:  Menarche age 43, first live birth age 46. She is GX P2. She underwent endometrial ablation in 2009 and has not had a period since that time. She never took hormone replacement. She did use birth control pills remotely with no complications  SOCIAL HISTORY: (Updated 06/15/2013) Sydney Ford works at [employer] for a variety of long-term clients. She does not work for a company. Her husband Sydney Ford "Sydney Ford" Sydney Ford is semiretired. He works for a company that [employer]  data on [place]. Son Sydney Ford is in [place] and works in Architect. Son Sydney Ford lives at home and is disabled with a diagnosis of paranoid schizophrenia. The patient has no grandchildren. She is not a church attender    ADVANCED DIRECTIVES: Not in place   HEALTH MAINTENANCE: (Updated 06/15/2013) History  Substance Use Topics  . Smoking status: Former Smoker    Quit date: 03/17/1985  . Smokeless tobacco: Never Used  . Alcohol Use: No     Colonoscopy: Never  PAP: Remote  Bone density: November 2014, showed osteoporosis   according to the patient's report  Lipid panel:  Not on file, Dr. Wilson Singer   Allergies  Allergen Reactions  . Codeine Itching and Nausea Only    REACTION: Nausea and itching  . Compazine [Prochlorperazine Edisylate] Other (See Comments)    Head aches   . Other     Close contact with Some recycled plastics cause whelps  . Tape Rash    Needs OPSITE dressing with port  access    Current Outpatient Prescriptions  Medication Sig Dispense Refill  . atorvastatin (LIPITOR) 20 MG tablet Take 20 mg by mouth every evening.       . Cholecalciferol (VITAMIN D) 2000 UNITS CAPS Take 6,000 Units by mouth every morning.      . lidocaine-prilocaine (EMLA) cream Apply 1 application topically daily as needed. port      . omeprazole (PRILOSEC) 40 MG capsule Take 1 capsule (40 mg total) by mouth daily.  30 capsule  4  . ondansetron (ZOFRAN) 8 MG tablet Take 1 tablet (8 mg total) by mouth every 8 (eight) hours as needed for nausea or vomiting.  20 tablet  0  . sertraline (ZOLOFT) 100 MG tablet Take 1 tablet (100 mg total) by mouth at bedtime.  30 tablet  1  . thyroid (ARMOUR) 90 MG tablet Take 90 mg by mouth every morning.      . traZODone (DESYREL) 50 MG tablet Take 1 tablet (50 mg total) by mouth at bedtime as needed for sleep.  30 tablet  1  . venlafaxine (EFFEXOR) 37.5 MG tablet Take 37.5 mg by mouth 2 (two) times daily with a meal.       . dexamethasone (DECADRON) 4 MG  tablet Take 4 mg by mouth as needed (Starts daily on Friday - Monday after Thursdays chemo treatment).       . LORazepam (ATIVAN) 0.5 MG tablet Take 0.5 mg by mouth every 8 (eight) hours as needed for anxiety (or nausea).       . nitrofurantoin, macrocrystal-monohydrate, (MACROBID) 100 MG capsule Take 1 capsule (100 mg total) by mouth 2 (two) times daily. X 10 days  20 capsule  0  . promethazine (PHENERGAN) 25 MG suppository Place 1 suppository (25 mg total) rectally every 8 (eight) hours as needed for nausea or vomiting.  12 each  0  . traMADol (ULTRAM) 50 MG tablet Take 1-2 tablets (50-100 mg total) by mouth 3 (three) times daily as needed for moderate pain or severe pain.  100 tablet  0   No current facility-administered medications for this visit.    OBJECTIVE: Middle-aged white woman who appears tired but is in no acute distress Filed Vitals:   06/15/13 1017  BP: 161/66  Pulse: 78  Temp: 98.5 F (36.9 C)  Resp: 18     Body mass index is 40.56 kg/(m^2).    ECOG FS: 1 Filed Weights   06/15/13 1017  Weight: 221 lb 12.8 oz (100.608 kg)   Physical Exam: HEENT:  Sclerae anicteric.  Oropharynx clear and moist with no evidence of mucositis or oropharyngeal candidiasis. Neck supple, trachea midline.  NODES:  No cervical or supraclavicular lymphadenopathy palpated.  BREAST EXAM:  Deferred. Axillae are benign bilaterally, with no palpable lymphadenopathy. LUNGS:  Clear to auscultation bilaterally with good excursion.   No crackles, wheezes or rhonchi HEART:  Regular rate and rhythm.  ABDOMEN:  Soft, obese, nontender.  Positive bowel sounds. No organomegaly or masses palpated. MSK:  No focal spinal tenderness to palpation. Good range of motion bilaterally in the upper extremities. Right CVA tenderness. EXTREMITIES:  No peripheral edema.   SKIN:  Benign with no visible skin changes or lesions. No nail dyscrasia. No excessive ecchymoses. No petechiae. No pallor. NEURO:  Nonfocal. Well  oriented.  Appropriate affect.   LAB RESULTS:  Lab Results  Component Value Date   WBC 4.6 06/15/2013   NEUTROABS 3.6 06/15/2013   HGB 10.1* 06/15/2013   HCT 29.9*  06/15/2013   MCV 91.6 06/15/2013   PLT 276 06/15/2013      Chemistry      Component Value Date/Time   NA 145 06/09/2013 0847   NA 142 05/27/2013 0530   K 4.0 06/09/2013 0847   K 4.6 05/27/2013 0530   CL 104 05/27/2013 0530   CO2 21* 06/09/2013 0847   CO2 28 05/27/2013 0530   BUN 10.9 06/09/2013 0847   BUN 15 05/27/2013 0530   CREATININE 0.8 06/09/2013 0847   CREATININE 0.94 05/27/2013 0530      Component Value Date/Time   CALCIUM 9.4 06/09/2013 0847   CALCIUM 8.8 05/27/2013 0530   ALKPHOS 73 06/09/2013 0847   ALKPHOS 65 05/27/2013 0530   AST 25 06/09/2013 0847   AST 7 05/27/2013 0530   ALT 40 06/09/2013 0847   ALT 11 05/27/2013 0530   BILITOT 0.43 06/09/2013 0847   BILITOT 0.5 05/27/2013 0530       STUDIES:  CT Abdomen Pelvis Wo Contrast 06/15/2013  CLINICAL DATA: Abdominal pain, history of kidney stones and breast  cancer.  EXAM:  CT ABDOMEN AND PELVIS WITHOUT CONTRAST  TECHNIQUE:  Multidetector CT imaging of the abdomen and pelvis was performed  following the standard protocol without intravenous contrast.  COMPARISON: US RENAL dated 05/30/2013  FINDINGS:  Limited view of the lung bases are clear. Included heart and  pericardium are unremarkable.  Moderate right hydroureteronephrosis to the level of the mid ureter  where a 7 x 6 mm calculus is seen. Multiple bilateral  nephrolithiasis, including 6 mm right interpolar calculus,  contiguous calculi in the right lower pole measuring 6 mm. 6 mm left  lower pole renal calculus. No left hydronephrosis.  The liver, pancreas, gallbladder and adrenal glands are  unremarkable. Coarse calcification in the spleen which is otherwise  unremarkable.  Stomach, small and large bowel are normal in course and caliber  without inflammatory changes. Normal appendix. No intraperitoneal   free fluid or free air. Aortoiliac vessels are normal course and  caliber. Internal reproductive organs are unremarkable. Urinary  bladder is decompressed harboring no intravesicular calculi.  Phleboliths in the pelvis.  Moderate degenerative changes of pubic symphysis. Grade 1 L5-S1  anterolisthesis on degenerative basis with severe L4-5 degenerative  disc disease. Small fat containing umbilical hernia.  IMPRESSION:  Moderate right hydroureteronephrosis to the level of the mid ureter  where a 7 x 6 mm calculus is seen. Bilateral residual  nephrolithiasis as described above. No left hydronephrosis.  Electronically Signed  By: Elon Alas  On: 06/15/2013 05:22      Mr Breast Bilateral W Wo Contrast 05/17/2013   CLINICAL DATA:  Assess response to neoadjuvant chemotherapy.  EXAM: BILATERAL BREAST MRI WITH AND WITHOUT CONTRAST  TECHNIQUE: Multiplanar, multisequence MR images of both breasts were obtained prior to and following the intravenous administration of 12m of MultiHance  THREE-DIMENSIONAL MR IMAGE RENDERING ON INDEPENDENT WORKSTATION:  Three-dimensional MR images were rendered by post-processing of the original MR data on an independent workstation. The three-dimensional MR images were interpreted, and findings are reported in the following complete MRI report for this study. Three dimensional images were evaluated at the independent Dyna[employer]ad workstation  COMPARISON:  Previous breast MRI dated 03/08/2013.  FINDINGS: Breast composition:  Density b:  Scattered fibroglandular.  Background parenchymal enhancement: Mild  Right breast: There has been significant interval decrease in the size and enhancement of the previously described irregular enhancing mass located within the upper outer quadrant of the right breast.  Only a small amount of non mass enhancement remains in the region. A measurable discrete mass is no longer present. There are no new findings within the right breast.  Left  breast: No mass or abnormal enhancement.  Lymph nodes: No abnormal appearing lymph nodes.  Ancillary findings:  None.  IMPRESSION: Marked decrease in size of the a pre-existing irregularly enhancing mass located within the upper outer quadrant of the right breast as discussed above. No other significant change.  RECOMMENDATION: Treatment plan.  BI-RADS CATEGORY  6: Known biopsy-proven malignancy - appropriate action should be taken.   Electronically Signed   By: Luberta Robertson M.D.   On: 05/17/2013 14:03      ASSESSMENT: 58 y.o. 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  (2)  being treated in the neoadjuvant setting, the plan being to complete 4 dose dense cycles of doxorubicin/cyclophosphamide, first dose on 03/31/2013 and final dose on 05/19/2013. Cycle 4 was delayed one week because of bad weather.  Neulasta on day 2 for granulocyte support.   (3)  doxorubicin/cyclophosphamide is being followed by 12 weekly doses of carboplatin/paclitaxel prior to definitive surgery, first dose 06/09/2013.  (4)  recurrent nephrolithiasis  PLAN: Sydney Ford feels ready and able to receive her second dose of carboplatin/paclitaxel tomorrow as scheduled on 06/16/2013. We did review her antinausea regimen today. Recall that she developed a severe headache on Compazine, so we're basically going to follow the same regimen she had with her original doxorubicin/cyclophosphamide. Specifically she will be taking dexamethasone (8 mg by mouth with food twice daily x3 days after chemotherapy) and ondansetron (8 mg by mouth twice daily x3 days after chemotherapy, then one tablet every 12 hours as needed for nausea). I am also calling in a prescription for Phenergan suppositories to be used up to every 8 hours as needed.   With regards to her kidney stones, I am referring  her to a [facility] urology for further evaluation at their next available appointment. I do feel like she needs to be seeing her on weekly basis for these next few weeks due to her comorbidities, so she will see Dr. Jana Hakim next week on April 9 prior to her third weekly dose of carboplatin/paclitaxel. Of course in the meanwhile, she knows to call with any changes or problems whatsoever.  Sydney Ford voices both her understanding and agreement with the above plan.   Trany Chernick, PA-C    06/15/2013

## 2013-06-15 NOTE — Discharge Instructions (Signed)
Take ultram as needed for pain. Do not drive within four hours of taking this medication (may cause drowsiness or confusion).   Take zofran as needed for nausea.  Follow up with your urologist.  Please return to the ER right away if you develop worsening pain, fever, uncontrolled vomiting.

## 2013-06-15 NOTE — ED Notes (Signed)
Patient with c/o "severe pain in lower right abdomen" and states that she is afraid that "you have dislodged another kidney stone." Patient believes that US performed during last week's ED visit is causing pain that she is experiencing tonight, because "You all were rolling me around during the ultrasound and you've now dislodged another one." Patient denies N/V/D--states she threw up Saturday, but also had chemo tx on Thursday Patient states that she took two Ultram PTA tonight for pain

## 2013-06-16 ENCOUNTER — Other Ambulatory Visit: Payer: Self-pay | Admitting: *Deleted

## 2013-06-16 ENCOUNTER — Ambulatory Visit (HOSPITAL_BASED_OUTPATIENT_CLINIC_OR_DEPARTMENT_OTHER): Payer: BC Managed Care – PPO

## 2013-06-16 ENCOUNTER — Other Ambulatory Visit: Payer: BC Managed Care – PPO

## 2013-06-16 VITALS — BP 119/73 | HR 100 | Temp 98.6°F | Resp 20

## 2013-06-16 DIAGNOSIS — F411 Generalized anxiety disorder: Secondary | ICD-10-CM

## 2013-06-16 DIAGNOSIS — Z5111 Encounter for antineoplastic chemotherapy: Secondary | ICD-10-CM

## 2013-06-16 DIAGNOSIS — C50411 Malignant neoplasm of upper-outer quadrant of right female breast: Secondary | ICD-10-CM

## 2013-06-16 DIAGNOSIS — C50419 Malignant neoplasm of upper-outer quadrant of unspecified female breast: Secondary | ICD-10-CM

## 2013-06-16 MED ORDER — SODIUM CHLORIDE 0.9 % IV SOLN
Freq: Once | INTRAVENOUS | Status: AC
  Administered 2013-06-16: 09:00:00 via INTRAVENOUS

## 2013-06-16 MED ORDER — SODIUM CHLORIDE 0.9 % IV SOLN
80.0000 mg/m2 | Freq: Once | INTRAVENOUS | Status: AC
  Administered 2013-06-16: 168 mg via INTRAVENOUS
  Filled 2013-06-16: qty 28

## 2013-06-16 MED ORDER — ONDANSETRON 16 MG/50ML IVPB (CHCC)
16.0000 mg | Freq: Once | INTRAVENOUS | Status: AC
  Administered 2013-06-16: 16 mg via INTRAVENOUS

## 2013-06-16 MED ORDER — SODIUM CHLORIDE 0.9 % IV SOLN: Freq: Once | INTRAVENOUS | Status: DC

## 2013-06-16 MED ORDER — DEXAMETHASONE SODIUM PHOSPHATE 20 MG/5ML IJ SOLN
20.0000 mg | Freq: Once | INTRAMUSCULAR | Status: AC
  Administered 2013-06-16: 20 mg via INTRAVENOUS

## 2013-06-16 MED ORDER — DEXAMETHASONE SODIUM PHOSPHATE 20 MG/5ML IJ SOLN
INTRAMUSCULAR | Status: AC
  Filled 2013-06-16: qty 5

## 2013-06-16 MED ORDER — HEPARIN SOD (PORK) LOCK FLUSH 100 UNIT/ML IV SOLN
500.0000 [IU] | Freq: Once | INTRAVENOUS | Status: AC | PRN
  Administered 2013-06-16: 500 [IU]
  Filled 2013-06-16: qty 5

## 2013-06-16 MED ORDER — DIPHENHYDRAMINE HCL 50 MG/ML IJ SOLN
25.0000 mg | Freq: Once | INTRAMUSCULAR | Status: AC
  Administered 2013-06-16: 25 mg via INTRAVENOUS

## 2013-06-16 MED ORDER — FAMOTIDINE IN NACL 20-0.9 MG/50ML-% IV SOLN
INTRAVENOUS | Status: AC
  Filled 2013-06-16: qty 50

## 2013-06-16 MED ORDER — ONDANSETRON 16 MG/50ML IVPB (CHCC)
INTRAVENOUS | Status: AC
  Filled 2013-06-16: qty 16

## 2013-06-16 MED ORDER — DIPHENHYDRAMINE HCL 50 MG/ML IJ SOLN
INTRAMUSCULAR | Status: AC
  Filled 2013-06-16: qty 1

## 2013-06-16 MED ORDER — SODIUM CHLORIDE 0.9 % IV SOLN
264.4000 mg | Freq: Once | INTRAVENOUS | Status: AC
  Administered 2013-06-16: 260 mg via INTRAVENOUS
  Filled 2013-06-16: qty 26

## 2013-06-16 MED ORDER — SODIUM CHLORIDE 0.9 % IJ SOLN
10.0000 mL | INTRAMUSCULAR | Status: DC | PRN
  Administered 2013-06-16: 10 mL
  Filled 2013-06-16: qty 10

## 2013-06-16 MED ORDER — LORAZEPAM 2 MG/ML IJ SOLN
INTRAMUSCULAR | Status: AC
  Filled 2013-06-16: qty 1

## 2013-06-16 MED ORDER — FAMOTIDINE IN NACL 20-0.9 MG/50ML-% IV SOLN
20.0000 mg | Freq: Once | INTRAVENOUS | Status: AC
  Administered 2013-06-16: 20 mg via INTRAVENOUS

## 2013-06-16 MED ORDER — LORAZEPAM 2 MG/ML IJ SOLN
0.5000 mg | Freq: Once | INTRAMUSCULAR | Status: AC
  Administered 2013-06-16: 0.5 mg via INTRAVENOUS

## 2013-06-16 NOTE — Patient Instructions (Signed)
[facility] Discharge Instructions for Patients Receiving Chemotherapy  Today you received the following chemotherapy agents: Taxol and Carboplatin.  To help prevent nausea and vomiting after your treatment, we encourage you to take your nausea medication as prescribed.   If you develop nausea and vomiting that is not controlled by your nausea medication, call the clinic.   BELOW ARE SYMPTOMS THAT SHOULD BE REPORTED IMMEDIATELY:  *FEVER GREATER THAN 100.5 F  *CHILLS WITH OR WITHOUT FEVER  NAUSEA AND VOMITING THAT IS NOT CONTROLLED WITH YOUR NAUSEA MEDICATION  *UNUSUAL SHORTNESS OF BREATH  *UNUSUAL BRUISING OR BLEEDING  TENDERNESS IN MOUTH AND THROAT WITH OR WITHOUT PRESENCE OF ULCERS  *URINARY PROBLEMS  *BOWEL PROBLEMS  UNUSUAL RASH Items with * indicate a potential emergency and should be followed up as soon as possible.  Feel free to call the clinic you have any questions or concerns. The clinic phone number is [phone].    

## 2013-06-16 NOTE — Progress Notes (Signed)
Patient was at waiting area when staff arrived this morning, c/o heart palpitations, has been started on new med for kidney stones, has been trying to drink but c/o frequent and large volume of urine. Moved patient to exam room for further assessment. Dr Jana Hakim in room to assess patient. Patient admits to feeling dehydrated and getting overly anxious with these feelings. We will get patient accessed and give fluids and Ativan to help with nervousness. We will reassess after recieving fluids for any further treatment.

## 2013-06-17 ENCOUNTER — Telehealth: Payer: Self-pay | Admitting: *Deleted

## 2013-06-17 ENCOUNTER — Other Ambulatory Visit: Payer: Self-pay | Admitting: Oncology

## 2013-06-17 DIAGNOSIS — C50411 Malignant neoplasm of upper-outer quadrant of right female breast: Secondary | ICD-10-CM

## 2013-06-17 NOTE — Telephone Encounter (Signed)
Mailed after appt letter pt.

## 2013-06-22 ENCOUNTER — Other Ambulatory Visit: Payer: Self-pay

## 2013-06-23 ENCOUNTER — Other Ambulatory Visit: Payer: BC Managed Care – PPO

## 2013-06-23 ENCOUNTER — Other Ambulatory Visit (HOSPITAL_BASED_OUTPATIENT_CLINIC_OR_DEPARTMENT_OTHER): Payer: BC Managed Care – PPO

## 2013-06-23 ENCOUNTER — Telehealth: Payer: Self-pay | Admitting: *Deleted

## 2013-06-23 ENCOUNTER — Ambulatory Visit (HOSPITAL_BASED_OUTPATIENT_CLINIC_OR_DEPARTMENT_OTHER): Payer: BC Managed Care – PPO | Admitting: Oncology

## 2013-06-23 ENCOUNTER — Other Ambulatory Visit: Payer: Self-pay | Admitting: Physician Assistant

## 2013-06-23 ENCOUNTER — Telehealth: Payer: Self-pay | Admitting: Oncology

## 2013-06-23 ENCOUNTER — Other Ambulatory Visit: Payer: Self-pay | Admitting: *Deleted

## 2013-06-23 ENCOUNTER — Ambulatory Visit (HOSPITAL_BASED_OUTPATIENT_CLINIC_OR_DEPARTMENT_OTHER): Payer: BC Managed Care – PPO

## 2013-06-23 VITALS — BP 145/84 | HR 73 | Temp 98.3°F | Resp 20 | Ht 62.0 in | Wt 220.4 lb

## 2013-06-23 DIAGNOSIS — R110 Nausea: Secondary | ICD-10-CM

## 2013-06-23 DIAGNOSIS — Z87442 Personal history of urinary calculi: Secondary | ICD-10-CM

## 2013-06-23 DIAGNOSIS — C50219 Malignant neoplasm of upper-inner quadrant of unspecified female breast: Secondary | ICD-10-CM

## 2013-06-23 DIAGNOSIS — C50411 Malignant neoplasm of upper-outer quadrant of right female breast: Secondary | ICD-10-CM

## 2013-06-23 DIAGNOSIS — C50419 Malignant neoplasm of upper-outer quadrant of unspecified female breast: Secondary | ICD-10-CM

## 2013-06-23 DIAGNOSIS — Z5111 Encounter for antineoplastic chemotherapy: Secondary | ICD-10-CM

## 2013-06-23 LAB — COMPREHENSIVE METABOLIC PANEL (CC13)
ALBUMIN: 3.8 g/dL (ref 3.5–5.0)
ALT: 14 U/L (ref 0–55)
ANION GAP: 10 meq/L (ref 3–11)
AST: 10 U/L (ref 5–34)
Alkaline Phosphatase: 67 U/L (ref 40–150)
BUN: 19 mg/dL (ref 7.0–26.0)
CALCIUM: 9.7 mg/dL (ref 8.4–10.4)
CHLORIDE: 104 meq/L (ref 98–109)
CO2: 28 meq/L (ref 22–29)
Creatinine: 1.1 mg/dL (ref 0.6–1.1)
Glucose: 95 mg/dl (ref 70–140)
POTASSIUM: 3.5 meq/L (ref 3.5–5.1)
Sodium: 142 mEq/L (ref 136–145)
Total Bilirubin: 0.46 mg/dL (ref 0.20–1.20)
Total Protein: 6.6 g/dL (ref 6.4–8.3)

## 2013-06-23 LAB — CBC WITH DIFFERENTIAL/PLATELET
BASO%: 0.1 % (ref 0.0–2.0)
Basophils Absolute: 0 10*3/uL (ref 0.0–0.1)
EOS%: 0 % (ref 0.0–7.0)
Eosinophils Absolute: 0 10*3/uL (ref 0.0–0.5)
HEMATOCRIT: 30.2 % — AB (ref 34.8–46.6)
HGB: 10.2 g/dL — ABNORMAL LOW (ref 11.6–15.9)
LYMPH#: 0.4 10*3/uL — AB (ref 0.9–3.3)
LYMPH%: 4.5 % — ABNORMAL LOW (ref 14.0–49.7)
MCH: 31.9 pg (ref 25.1–34.0)
MCHC: 33.9 g/dL (ref 31.5–36.0)
MCV: 94 fL (ref 79.5–101.0)
MONO#: 0.5 10*3/uL (ref 0.1–0.9)
MONO%: 5.6 % (ref 0.0–14.0)
NEUT#: 8.3 10*3/uL — ABNORMAL HIGH (ref 1.5–6.5)
NEUT%: 89.8 % — AB (ref 38.4–76.8)
Platelets: 208 10*3/uL (ref 145–400)
RBC: 3.21 10*6/uL — ABNORMAL LOW (ref 3.70–5.45)
RDW: 19.5 % — AB (ref 11.2–14.5)
WBC: 9.2 10*3/uL (ref 3.9–10.3)

## 2013-06-23 MED ORDER — DIPHENHYDRAMINE HCL 50 MG/ML IJ SOLN
INTRAMUSCULAR | Status: AC
  Filled 2013-06-23: qty 1

## 2013-06-23 MED ORDER — SODIUM CHLORIDE 0.9 % IV SOLN
80.0000 mg/m2 | Freq: Once | INTRAVENOUS | Status: AC
  Administered 2013-06-23: 168 mg via INTRAVENOUS
  Filled 2013-06-23: qty 28

## 2013-06-23 MED ORDER — ONDANSETRON 16 MG/50ML IVPB (CHCC)
16.0000 mg | Freq: Once | INTRAVENOUS | Status: AC
  Administered 2013-06-23: 16 mg via INTRAVENOUS

## 2013-06-23 MED ORDER — SODIUM CHLORIDE 0.9 % IV SOLN
Freq: Once | INTRAVENOUS | Status: AC
  Administered 2013-06-23: 12:00:00 via INTRAVENOUS

## 2013-06-23 MED ORDER — DEXAMETHASONE SODIUM PHOSPHATE 20 MG/5ML IJ SOLN
INTRAMUSCULAR | Status: AC
  Filled 2013-06-23: qty 5

## 2013-06-23 MED ORDER — HEPARIN SOD (PORK) LOCK FLUSH 100 UNIT/ML IV SOLN
500.0000 [IU] | Freq: Once | INTRAVENOUS | Status: AC | PRN
  Administered 2013-06-23: 500 [IU]
  Filled 2013-06-23: qty 5

## 2013-06-23 MED ORDER — ONDANSETRON 16 MG/50ML IVPB (CHCC)
INTRAVENOUS | Status: AC
  Filled 2013-06-23: qty 16

## 2013-06-23 MED ORDER — FAMOTIDINE IN NACL 20-0.9 MG/50ML-% IV SOLN
20.0000 mg | Freq: Once | INTRAVENOUS | Status: AC
  Administered 2013-06-23: 20 mg via INTRAVENOUS

## 2013-06-23 MED ORDER — METOCLOPRAMIDE HCL 5 MG PO TABS: 5.0000 mg | ORAL_TABLET | Freq: Three times a day (TID) | ORAL | Status: DC

## 2013-06-23 MED ORDER — SODIUM CHLORIDE 0.9 % IV SOLN
264.4000 mg | Freq: Once | INTRAVENOUS | Status: AC
  Administered 2013-06-23: 260 mg via INTRAVENOUS
  Filled 2013-06-23: qty 26

## 2013-06-23 MED ORDER — FAMOTIDINE IN NACL 20-0.9 MG/50ML-% IV SOLN
INTRAVENOUS | Status: AC
  Filled 2013-06-23: qty 50

## 2013-06-23 MED ORDER — SODIUM CHLORIDE 0.9 % IJ SOLN
10.0000 mL | INTRAMUSCULAR | Status: DC | PRN
  Administered 2013-06-23: 10 mL
  Filled 2013-06-23: qty 10

## 2013-06-23 MED ORDER — DIPHENHYDRAMINE HCL 50 MG/ML IJ SOLN
25.0000 mg | Freq: Once | INTRAMUSCULAR | Status: AC
  Administered 2013-06-23: 25 mg via INTRAVENOUS

## 2013-06-23 MED ORDER — DEXAMETHASONE SODIUM PHOSPHATE 20 MG/5ML IJ SOLN
20.0000 mg | Freq: Once | INTRAMUSCULAR | Status: AC
  Administered 2013-06-23: 20 mg via INTRAVENOUS

## 2013-06-23 NOTE — Telephone Encounter (Signed)
Sent michelle a staff message to r/s the pt's chemo appt to follow the md visit on 07/07/2013.

## 2013-06-23 NOTE — Patient Instructions (Signed)
[facility] Discharge Instructions for Patients Receiving Chemotherapy  Today you received the following chemotherapy agents taxol/carboplatin  To help prevent nausea and vomiting after your treatment, we encourage you to take your nausea medication as directed   If you develop nausea and vomiting that is not controlled by your nausea medication, call the clinic.   BELOW ARE SYMPTOMS THAT SHOULD BE REPORTED IMMEDIATELY:  *FEVER GREATER THAN 100.5 F  *CHILLS WITH OR WITHOUT FEVER  NAUSEA AND VOMITING THAT IS NOT CONTROLLED WITH YOUR NAUSEA MEDICATION  *UNUSUAL SHORTNESS OF BREATH  *UNUSUAL BRUISING OR BLEEDING  TENDERNESS IN MOUTH AND THROAT WITH OR WITHOUT PRESENCE OF ULCERS  *URINARY PROBLEMS  *BOWEL PROBLEMS  UNUSUAL RASH Items with * indicate a potential emergency and should be followed up as soon as possible.  Feel free to call the clinic you have any questions or concerns. The clinic phone number is [phone].  

## 2013-06-23 NOTE — Progress Notes (Signed)
[facility]  Telephone:([phone]) [phone] Fax:(336) [phone]     ID: BICH MCHANEY OB: 1955/11/13  MR#: [id number]  HQP#:[id number]  PCP: Dwan Bolt, MD GYN:   SU: Rolm Bookbinder, MD OTHER MD: Ulyess Blossom, MD  CHIEF COMPLAINT:  Right breast cancer/neoadjuvant chemotherapy   HISTORY OF PRESENT ILLNESS: Sydney Ford (pronounced "line 'em") herself noted a change in her right breast sometime October 2014 but "my breasts are like bean bags" and she initially ignored it. After while she began to feel a pole when she lay on her right side at night so she scheduled herself for screening mammography at the [facility] 02/08/2013 (prior mammogram had been April 2011), and this did show a possible mass in the right breast. Unilateral right diagnostic mammography and ultrasonography 02/28/2013 confirmed an irregular mass in the right breast upper outer quadrant measuring 2.4 cm. This was palpable at the 11:00 position. Ultrasound showed an irregular hypoechoic mass measuring 1.5 cm with no abnormal adenopathy in the right axilla.  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  The patient met with Dr. Donne Hazel 03/14/2013 and he recommended primary systemic chemotherapy to decrease the size of the tumor and increase the chance of lumpectomy.   Subsequent history is as detailed below.  INTERVAL HISTORY: Sydney Ford returns today for followup of her breast cancer. This is day 1 cycle 3 of 12 planned weekly cycles of carboplatin and docetaxel. The interval history is complicated by her having developed significant flank pain with CT scan showing renal stones. In addition she neglected to take her dexamethasone and had some nausea and vomiting. 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 The patient did vomit last night. She is getting a feeling in her throat that food won't go down. She has no appetite and he really does does a lot of even try liquids. I think she is getting sensitized and this is a real concern. She reports no problems regarding peripheral neuropathy. Aside from that, a detailed review of systems today was entirely noncontributory except as noted   PAST MEDICAL HISTORY: Past Medical History  Diagnosis Date  . Hypertension   . Hyperlipidemia   . Thyroid disease   . Osteoporosis   . Hypothyroidism     hashimoto's  . Depression   . Pneumonia     2014  . Cold (disease) 03/21/12    couple of weeks ago - pt states much better now  . History of kidney stones     "passed 9 stones"  . GERD (gastroesophageal reflux disease)     no meds except occas tums  . Headache(784.0)     occas migraine  . Cancer     new dx of right breast cancer - planning chemo first  . Arthritis     oa; lower back pain - hx of injury yrs ago-occas flare ups  . Anemia     2005 - related to heavy menses with the thyroid probl    PAST SURGICAL HISTORY: Past Surgical History  Procedure Laterality Date  . Cesarean section  1987  . Tonsillectomy    . Uterine ablation  2008    no menstrual periods since procedure  .  Portacath placement N/A 03/23/2013    Procedure: INSERTION PORT-A-CATH;  Surgeon: Rolm Bookbinder, MD;  Location: [facility];  Service: General;  Laterality: N/A;    FAMILY HISTORY Family History  Problem Relation Age of Onset  . Heart failure Mother   . Heart attack Father   . Autoimmune disease Maternal Aunt     lupus, scelraderma, reynolds disease, and others  . Stroke Paternal Uncle   . Heart failure Maternal Grandmother   . Heart attack Maternal Grandfather   . Throat cancer Paternal Grandmother     dx late 50s; non smoker or drinker  .  Heart attack Paternal Grandfather   . Schizophrenia Son 55  . Heart attack Paternal Uncle    the patient's father died at the age of 85 from a myocardial infarction. The patient's mother died at the age of 29 with congestive heart failure. She also had a history of lupus. The patient had no brothers or sisters. There is no history of breast or ovarian cancer in the family to her knowledge.  GYNECOLOGIC HISTORY:  Menarche age 61, first live birth age 36. She is GX P2. She underwent endometrial ablation in 2009 and has not had a period since that time. She never took hormone replacement. She did use birth control pills remotely with no complications  SOCIAL HISTORY: (Updated 06/15/2013) Sydney Ford works at [employer] for a variety of long-term clients. She does not work for a company. Her husband Sydney Ford "Sydney Ford" Sydney Ford is semiretired. He works for a company that [employer] data on [place]. Son Sydney Ford is in [place] and works in Architect. Son Sydney Ford lives at home and is disabled with a diagnosis of paranoid schizophrenia. The patient has no grandchildren. She is not a church attender    ADVANCED DIRECTIVES: Not in place   HEALTH MAINTENANCE: (Updated 06/15/2013) History  Substance Use Topics  . Smoking status: Former Smoker    Quit date: 03/17/1985  . Smokeless tobacco: Never Used  . Alcohol Use: No     Colonoscopy: Never  PAP: Remote  Bone density: November 2014, showed osteoporosis   according to the patient's report  Lipid panel:  Not on file, Dr. Wilson Singer   Allergies  Allergen Reactions  . Codeine Itching and Nausea Only    REACTION: Nausea and itching  . Compazine [Prochlorperazine Edisylate] Other (See Comments)    Head aches   . Other     Close contact with Some recycled plastics cause whelps  . Tape Rash    Needs OPSITE dressing with port access    Current Outpatient Prescriptions  Medication Sig Dispense Refill  . atorvastatin (LIPITOR) 20 MG tablet Take 20 mg  by mouth every evening.       . Cholecalciferol (VITAMIN D) 2000 UNITS CAPS Take 6,000 Units by mouth every morning.      Marland Kitchen dexamethasone (DECADRON) 4 MG tablet Take 4 mg by mouth as needed (Starts daily on Friday - Monday after Thursdays chemo treatment).       Marland Kitchen dexamethasone (DECADRON) 4 MG tablet TAKE TWO TABLETS BY MOUTH ONCE A DAY ON THE DAY AFTER CHEMOTHERAPY AND THEN TAKE 2 TABLETS TWICE DAILY FOR 2 DAYS WITH FOOD  30 tablet  0  . lidocaine-prilocaine (EMLA) cream Apply 1 application topically daily as needed. port      . LORazepam (ATIVAN) 0.5 MG tablet Take 0.5 mg by mouth every 8 (eight) hours as needed for anxiety (or nausea).       Marland Kitchen  meloxicam (MOBIC) 15 MG tablet       . nitrofurantoin, macrocrystal-monohydrate, (MACROBID) 100 MG capsule Take 1 capsule (100 mg total) by mouth 2 (two) times daily. X 10 days  20 capsule  0  . omeprazole (PRILOSEC) 40 MG capsule Take 1 capsule (40 mg total) by mouth daily.  30 capsule  4  . ondansetron (ZOFRAN) 8 MG tablet Take 1 tablet (8 mg total) by mouth every 8 (eight) hours as needed for nausea or vomiting.  20 tablet  0  . promethazine (PHENERGAN) 25 MG suppository Place 1 suppository (25 mg total) rectally every 8 (eight) hours as needed for nausea or vomiting.  12 each  0  . sertraline (ZOLOFT) 100 MG tablet Take 1 tablet (100 mg total) by mouth at bedtime.  30 tablet  1  . tamsulosin (FLOMAX) 0.4 MG CAPS capsule Take 0.4 mg by mouth daily.      Marland Kitchen thyroid (ARMOUR) 90 MG tablet Take 90 mg by mouth every morning.      . traMADol (ULTRAM) 50 MG tablet Take 1-2 tablets (50-100 mg total) by mouth 3 (three) times daily as needed for moderate pain or severe pain.  100 tablet  0  . traZODone (DESYREL) 50 MG tablet Take 1 tablet (50 mg total) by mouth at bedtime as needed for sleep.  30 tablet  1  . venlafaxine (EFFEXOR) 37.5 MG tablet Take 37.5 mg by mouth 2 (two) times daily with a meal.        No current facility-administered medications for this  visit.    OBJECTIVE: Middle-aged white woman in no acute distress Filed Vitals:   06/23/13 1035  BP: 145/84  Pulse: 73  Temp: 98.3 F (36.8 C)  Resp: 20     Body mass index is 40.3 kg/(m^2).    ECOG FS: 1 Filed Weights   06/23/13 1035  Weight: 220 lb 6.4 oz (99.973 kg)   Sclerae unicteric, pupils equal and reactive Oropharynx clear and moist-- no thrush or other lesions No cervical or supraclavicular adenopathy Lungs no rales or rhonchi Heart regular rate and rhythm Abd soft, obese, nontender, positive bowel sounds MSK no focal spinal tenderness, no upper extremity lymphedema Neuro: nonfocal, well oriented, positive  Breasts: I no longer palpate a mass in the right breast. There are no skin or nipple changes of concern. The right axilla is benign. The left breast is unremarkable.     LAB RESULTS:  Lab Results  Component Value Date   WBC 9.2 06/23/2013   NEUTROABS 8.3* 06/23/2013   HGB 10.2* 06/23/2013   HCT 30.2* 06/23/2013   MCV 94.0 06/23/2013   PLT 208 06/23/2013      Chemistry      Component Value Date/Time   NA 142 06/23/2013 1024   NA 142 05/27/2013 0530   K 3.5 06/23/2013 1024   K 4.6 05/27/2013 0530   CL 104 05/27/2013 0530   CO2 28 06/23/2013 1024   CO2 28 05/27/2013 0530   BUN 19.0 06/23/2013 1024   BUN 15 05/27/2013 0530   CREATININE 1.1 06/23/2013 1024   CREATININE 0.94 05/27/2013 0530      Component Value Date/Time   CALCIUM 9.7 06/23/2013 1024   CALCIUM 8.8 05/27/2013 0530   ALKPHOS 67 06/23/2013 1024   ALKPHOS 65 05/27/2013 0530   AST 10 06/23/2013 1024   AST 7 05/27/2013 0530   ALT 14 06/23/2013 1024   ALT 11 05/27/2013 0530   BILITOT 0.46 06/23/2013 1024  BILITOT 0.5 05/27/2013 0530       STUDIES:  CT Abdomen Pelvis Wo Contrast 06/15/2013  CLINICAL DATA: Abdominal pain, history of kidney stones and breast  cancer.  EXAM:  CT ABDOMEN AND PELVIS WITHOUT CONTRAST  TECHNIQUE:  Multidetector CT imaging of the abdomen and pelvis was performed  following the standard  protocol without intravenous contrast.  COMPARISON: US RENAL dated 05/30/2013  FINDINGS:  Limited view of the lung bases are clear. Included heart and  pericardium are unremarkable.  Moderate right hydroureteronephrosis to the level of the mid ureter  where a 7 x 6 mm calculus is seen. Multiple bilateral  nephrolithiasis, including 6 mm right interpolar calculus,  contiguous calculi in the right lower pole measuring 6 mm. 6 mm left  lower pole renal calculus. No left hydronephrosis.  The liver, pancreas, gallbladder and adrenal glands are  unremarkable. Coarse calcification in the spleen which is otherwise  unremarkable.  Stomach, small and large bowel are normal in course and caliber  without inflammatory changes. Normal appendix. No intraperitoneal  free fluid or free air. Aortoiliac vessels are normal course and  caliber. Internal reproductive organs are unremarkable. Urinary  bladder is decompressed harboring no intravesicular calculi.  Phleboliths in the pelvis.  Moderate degenerative changes of pubic symphysis. Grade 1 L5-S1  anterolisthesis on degenerative basis with severe L4-5 degenerative  disc disease. Small fat containing umbilical hernia.  IMPRESSION:  Moderate right hydroureteronephrosis to the level of the mid ureter  where a 7 x 6 mm calculus is seen. Bilateral residual  nephrolithiasis as described above. No left hydronephrosis.  Electronically Signed  By: Elon Alas  On: 06/15/2013 05:22    ASSESSMENT: 58 y.o. 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  (2)  being treated in the neoadjuvant setting, the plan being to complete 4 dose dense cycles of doxorubicin/cyclophosphamide, first dose on 03/31/2013 and final dose on 05/19/2013. Cycle 4 was delayed one week because of bad weather.  Neulasta on  day 2 for granulocyte support.   (3)  doxorubicin/cyclophosphamide is being followed by 12 weekly doses of carboplatin/paclitaxel prior to definitive surgery, first dose 06/09/2013.  (4)  recurrent nephrolithiasis  PLAN: Sydney Ford is having a very good response to treatment and her counts are good. It doesn't help that she is developed renal stones in the middle of her chemotherapy. The real issue though is for nausea. I am concerned that she is becoming sensitize him that can be very difficult to control.  Today I have started her on metoclopramide 5 mg 3 times a day before meals. You should take this continuously until she is done with chemotherapy. I also suggested she try a half L. lorazepam tablet and see if that helps her a "disconnect" the association between treatments, food, and nausea. Otherwise she will continue the dexamethasone and ondansetron as before.  She will see [place] again next week for fourth of 12 planned weekly cycles. She knows to call for any problems that may develop before her next visit here.  Chauncey Cruel, MD

## 2013-06-23 NOTE — Telephone Encounter (Signed)
Per staff message and POF I have scheduled appts.  JMW  

## 2013-06-29 ENCOUNTER — Other Ambulatory Visit: Payer: Self-pay | Admitting: Urology

## 2013-06-29 ENCOUNTER — Telehealth: Payer: Self-pay | Admitting: Oncology

## 2013-06-29 ENCOUNTER — Encounter (HOSPITAL_COMMUNITY): Payer: Self-pay | Admitting: *Deleted

## 2013-06-29 NOTE — Telephone Encounter (Signed)
, °

## 2013-06-29 NOTE — Progress Notes (Signed)
Surgery on 06/30/13.  Need orders in EPIC.  Thank You.  

## 2013-06-30 ENCOUNTER — Encounter (HOSPITAL_COMMUNITY): Payer: BC Managed Care – PPO | Admitting: Certified Registered Nurse Anesthetist

## 2013-06-30 ENCOUNTER — Encounter (HOSPITAL_COMMUNITY)
Admission: RE | Disposition: A | Discharge status: Home / Self Care | Payer: Self-pay | Source: Ambulatory Visit | Attending: Urology

## 2013-06-30 ENCOUNTER — Ambulatory Visit: Payer: BC Managed Care – PPO | Admitting: Physician Assistant

## 2013-06-30 ENCOUNTER — Other Ambulatory Visit: Payer: BC Managed Care – PPO

## 2013-06-30 ENCOUNTER — Ambulatory Visit (HOSPITAL_COMMUNITY): Payer: BC Managed Care – PPO | Admitting: Certified Registered Nurse Anesthetist

## 2013-06-30 ENCOUNTER — Ambulatory Visit: Payer: BC Managed Care – PPO

## 2013-06-30 ENCOUNTER — Ambulatory Visit (HOSPITAL_COMMUNITY)
Admission: RE | Admit: 2013-06-30 | Discharge: 2013-06-30 | Disposition: A | Discharge status: Home / Self Care | Payer: BC Managed Care – PPO | Source: Ambulatory Visit | Attending: Urology | Admitting: Urology

## 2013-06-30 ENCOUNTER — Encounter (HOSPITAL_COMMUNITY): Payer: Self-pay | Admitting: Certified Registered Nurse Anesthetist

## 2013-06-30 DIAGNOSIS — N200 Calculus of kidney: Secondary | ICD-10-CM | POA: Insufficient documentation

## 2013-06-30 DIAGNOSIS — G43909 Migraine, unspecified, not intractable, without status migrainosus: Secondary | ICD-10-CM | POA: Insufficient documentation

## 2013-06-30 DIAGNOSIS — Z8701 Personal history of pneumonia (recurrent): Secondary | ICD-10-CM | POA: Insufficient documentation

## 2013-06-30 DIAGNOSIS — E785 Hyperlipidemia, unspecified: Secondary | ICD-10-CM | POA: Insufficient documentation

## 2013-06-30 DIAGNOSIS — E063 Autoimmune thyroiditis: Secondary | ICD-10-CM | POA: Insufficient documentation

## 2013-06-30 DIAGNOSIS — I10 Essential (primary) hypertension: Secondary | ICD-10-CM | POA: Insufficient documentation

## 2013-06-30 DIAGNOSIS — M810 Age-related osteoporosis without current pathological fracture: Secondary | ICD-10-CM | POA: Insufficient documentation

## 2013-06-30 DIAGNOSIS — Z888 Allergy status to other drugs, medicaments and biological substances status: Secondary | ICD-10-CM | POA: Insufficient documentation

## 2013-06-30 DIAGNOSIS — Z87891 Personal history of nicotine dependence: Secondary | ICD-10-CM | POA: Insufficient documentation

## 2013-06-30 DIAGNOSIS — N201 Calculus of ureter: Secondary | ICD-10-CM

## 2013-06-30 DIAGNOSIS — Z9109 Other allergy status, other than to drugs and biological substances: Secondary | ICD-10-CM | POA: Insufficient documentation

## 2013-06-30 DIAGNOSIS — Z79899 Other long term (current) drug therapy: Secondary | ICD-10-CM | POA: Insufficient documentation

## 2013-06-30 DIAGNOSIS — F329 Major depressive disorder, single episode, unspecified: Secondary | ICD-10-CM | POA: Insufficient documentation

## 2013-06-30 DIAGNOSIS — F3289 Other specified depressive episodes: Secondary | ICD-10-CM | POA: Insufficient documentation

## 2013-06-30 DIAGNOSIS — E039 Hypothyroidism, unspecified: Secondary | ICD-10-CM | POA: Insufficient documentation

## 2013-06-30 DIAGNOSIS — D649 Anemia, unspecified: Secondary | ICD-10-CM | POA: Insufficient documentation

## 2013-06-30 DIAGNOSIS — M47817 Spondylosis without myelopathy or radiculopathy, lumbosacral region: Secondary | ICD-10-CM | POA: Insufficient documentation

## 2013-06-30 DIAGNOSIS — K219 Gastro-esophageal reflux disease without esophagitis: Secondary | ICD-10-CM | POA: Insufficient documentation

## 2013-06-30 DIAGNOSIS — C50919 Malignant neoplasm of unspecified site of unspecified female breast: Secondary | ICD-10-CM | POA: Insufficient documentation

## 2013-06-30 DIAGNOSIS — Z885 Allergy status to narcotic agent status: Secondary | ICD-10-CM | POA: Insufficient documentation

## 2013-06-30 HISTORY — PX: CYSTOSCOPY WITH RETROGRADE PYELOGRAM, URETEROSCOPY AND STENT PLACEMENT: SHX5789

## 2013-06-30 HISTORY — PX: HOLMIUM LASER APPLICATION: SHX5852

## 2013-06-30 HISTORY — DX: Chronic kidney disease, unspecified: N18.9

## 2013-06-30 LAB — BASIC METABOLIC PANEL
BUN: 23 mg/dL (ref 6–23)
CALCIUM: 8.9 mg/dL (ref 8.4–10.5)
CO2: 25 meq/L (ref 19–32)
CREATININE: 1.22 mg/dL — AB (ref 0.50–1.10)
Chloride: 101 mEq/L (ref 96–112)
GFR calc Af Amer: 55 mL/min — ABNORMAL LOW (ref >90)
GFR calc non Af Amer: 48 mL/min — ABNORMAL LOW (ref >90)
GLUCOSE: 88 mg/dL (ref 70–99)
Potassium: 4.1 mEq/L (ref 3.7–5.3)
SODIUM: 138 meq/L (ref 137–147)

## 2013-06-30 LAB — CBC
HCT: 29.9 % — ABNORMAL LOW (ref 36.0–46.0)
HEMOGLOBIN: 10.1 g/dL — AB (ref 12.0–15.0)
MCH: 31.9 pg (ref 26.0–34.0)
MCHC: 33.8 g/dL (ref 30.0–36.0)
MCV: 94.3 fL (ref 78.0–100.0)
Platelets: 125 10*3/uL — ABNORMAL LOW (ref 150–400)
RBC: 3.17 MIL/uL — ABNORMAL LOW (ref 3.87–5.11)
RDW: 18.1 % — ABNORMAL HIGH (ref 11.5–15.5)
WBC: 4.2 10*3/uL (ref 4.0–10.5)

## 2013-06-30 SURGERY — CYSTOURETEROSCOPY, WITH RETROGRADE PYELOGRAM AND STENT INSERTION
Anesthesia: General | Laterality: Right

## 2013-06-30 MED ORDER — DEXAMETHASONE SODIUM PHOSPHATE 10 MG/ML IJ SOLN
INTRAMUSCULAR | Status: AC
  Filled 2013-06-30: qty 1

## 2013-06-30 MED ORDER — FENTANYL CITRATE 0.05 MG/ML IJ SOLN
INTRAMUSCULAR | Status: DC | PRN
  Administered 2013-06-30 (×3): 25 ug via INTRAVENOUS
  Administered 2013-06-30: 50 ug via INTRAVENOUS
  Administered 2013-06-30: 25 ug via INTRAVENOUS

## 2013-06-30 MED ORDER — PROPOFOL 10 MG/ML IV BOLUS
INTRAVENOUS | Status: DC | PRN
  Administered 2013-06-30: 150 mg via INTRAVENOUS

## 2013-06-30 MED ORDER — FENTANYL CITRATE 0.05 MG/ML IJ SOLN
INTRAMUSCULAR | Status: AC
  Filled 2013-06-30: qty 5

## 2013-06-30 MED ORDER — LIDOCAINE HCL (CARDIAC) 20 MG/ML IV SOLN
INTRAVENOUS | Status: DC | PRN
  Administered 2013-06-30: 80 mg via INTRAVENOUS

## 2013-06-30 MED ORDER — SODIUM CHLORIDE 0.9 % IR SOLN
Status: DC | PRN
  Administered 2013-06-30: 4000 mL via INTRAVESICAL

## 2013-06-30 MED ORDER — MIDAZOLAM HCL 2 MG/2ML IJ SOLN
INTRAMUSCULAR | Status: AC
  Filled 2013-06-30: qty 2

## 2013-06-30 MED ORDER — KETOROLAC TROMETHAMINE 15 MG/ML IJ SOLN
15.0000 mg | Freq: Four times a day (QID) | INTRAMUSCULAR | Status: DC
  Administered 2013-06-30: 15 mg via INTRAVENOUS

## 2013-06-30 MED ORDER — SODIUM CHLORIDE 0.9 % IJ SOLN
INTRAMUSCULAR | Status: AC
  Filled 2013-06-30: qty 10

## 2013-06-30 MED ORDER — OXYBUTYNIN CHLORIDE 5 MG PO TABS: 5.0000 mg | ORAL_TABLET | Freq: Three times a day (TID) | ORAL | Status: DC

## 2013-06-30 MED ORDER — CEPHALEXIN 250 MG PO CAPS: 250.0000 mg | ORAL_CAPSULE | Freq: Two times a day (BID) | ORAL | Status: DC

## 2013-06-30 MED ORDER — IOHEXOL 300 MG/ML  SOLN
INTRAMUSCULAR | Status: DC | PRN
Start: 2013-06-30 — End: 2013-06-30
  Administered 2013-06-30: 10 mL via URETHRAL

## 2013-06-30 MED ORDER — EPHEDRINE SULFATE 50 MG/ML IJ SOLN
INTRAMUSCULAR | Status: AC
  Filled 2013-06-30: qty 1

## 2013-06-30 MED ORDER — ONDANSETRON HCL 4 MG/2ML IJ SOLN
INTRAMUSCULAR | Status: DC | PRN
  Administered 2013-06-30: 4 mg via INTRAVENOUS

## 2013-06-30 MED ORDER — LIDOCAINE HCL (CARDIAC) 20 MG/ML IV SOLN
INTRAVENOUS | Status: AC
  Filled 2013-06-30: qty 5

## 2013-06-30 MED ORDER — DEXAMETHASONE SODIUM PHOSPHATE 10 MG/ML IJ SOLN
INTRAMUSCULAR | Status: DC | PRN
  Administered 2013-06-30: 10 mg via INTRAVENOUS

## 2013-06-30 MED ORDER — KETOROLAC TROMETHAMINE 15 MG/ML IJ SOLN
INTRAMUSCULAR | Status: AC
  Filled 2013-06-30: qty 1

## 2013-06-30 MED ORDER — LACTATED RINGERS IV SOLN
INTRAVENOUS | Status: DC | PRN
  Administered 2013-06-30: 09:00:00 via INTRAVENOUS

## 2013-06-30 MED ORDER — MIDAZOLAM HCL 5 MG/5ML IJ SOLN
INTRAMUSCULAR | Status: DC | PRN
  Administered 2013-06-30: 1 mg via INTRAVENOUS

## 2013-06-30 MED ORDER — ONDANSETRON HCL 4 MG/2ML IJ SOLN
INTRAMUSCULAR | Status: AC
  Filled 2013-06-30: qty 2

## 2013-06-30 MED ORDER — PROMETHAZINE HCL 25 MG/ML IJ SOLN: 6.2500 mg | INTRAMUSCULAR | Status: DC | PRN

## 2013-06-30 MED ORDER — FENTANYL CITRATE 0.05 MG/ML IJ SOLN
25.0000 ug | INTRAMUSCULAR | Status: DC | PRN
  Administered 2013-06-30 (×2): 50 ug via INTRAVENOUS

## 2013-06-30 MED ORDER — PROPOFOL 10 MG/ML IV BOLUS
INTRAVENOUS | Status: AC
  Filled 2013-06-30: qty 20

## 2013-06-30 MED ORDER — CEFAZOLIN SODIUM-DEXTROSE 2-3 GM-% IV SOLR
INTRAVENOUS | Status: AC
  Filled 2013-06-30: qty 50

## 2013-06-30 MED ORDER — FENTANYL CITRATE 0.05 MG/ML IJ SOLN
INTRAMUSCULAR | Status: AC
  Filled 2013-06-30: qty 2

## 2013-06-30 MED ORDER — ATROPINE SULFATE 0.4 MG/ML IJ SOLN
INTRAMUSCULAR | Status: AC
  Filled 2013-06-30: qty 1

## 2013-06-30 MED ORDER — CEFAZOLIN SODIUM-DEXTROSE 2-3 GM-% IV SOLR
INTRAVENOUS | Status: DC | PRN
  Administered 2013-06-30: 2 g via INTRAVENOUS

## 2013-06-30 SURGICAL SUPPLY — 20 items
BAG URO CATCHER STRL LF (DRAPE) ×2 IMPLANT
BASKET ZERO TIP NITINOL 2.4FR (BASKET) ×1 IMPLANT
BSKT STON RTRVL ZERO TP 2.4FR (BASKET) ×1
CATH URET 5FR 28IN CONE TIP (BALLOONS) ×1
CATH URET 5FR 70CM CONE TIP (BALLOONS) ×1 IMPLANT
CLOTH BEACON ORANGE TIMEOUT ST (SAFETY) ×2 IMPLANT
DRAPE CAMERA CLOSED 9X96 (DRAPES) ×2 IMPLANT
FIBER LASER TRAC TIP (UROLOGICAL SUPPLIES) ×1 IMPLANT
GLOVE BIOGEL M 8.0 STRL (GLOVE) ×2 IMPLANT
GLOVE SURG SS PI 8.0 STRL IVOR (GLOVE) ×2 IMPLANT
GOWN STRL REUS W/ TWL XL LVL3 (GOWN DISPOSABLE) ×1 IMPLANT
GOWN STRL REUS W/TWL XL LVL3 (GOWN DISPOSABLE) ×4 IMPLANT
GUIDEWIRE STR DUAL SENSOR (WIRE) ×2 IMPLANT
MANIFOLD NEPTUNE II (INSTRUMENTS) ×2 IMPLANT
MARKER SKIN DUAL TIP RULER LAB (MISCELLANEOUS) ×2 IMPLANT
PACK CYSTO (CUSTOM PROCEDURE TRAY) ×2 IMPLANT
SHEATH ACCESS URETERAL 38CM (SHEATH) ×1 IMPLANT
STENT CONTOUR 6FRX24X.038 (STENTS) ×1 IMPLANT
TUBING CONNECTING 10 (TUBING) ×2 IMPLANT
WIRE COONS/BENSON .038X145CM (WIRE) ×2 IMPLANT

## 2013-06-30 NOTE — Op Note (Signed)
PATIENT:  Sydney Ford  PRE-OPERATIVE DIAGNOSIS: Right mid ureteral and right renal calculi  POST-OPERATIVE DIAGNOSIS: Same  PROCEDURE: Cystoscopy, right retrograde ureteropyelogram with interpretive fluoroscopy, rigid and flexible right ureteroscopy, holmium laser of right ureteral stone with extraction, extraction of right renal calculi, double-J stent placement  SURGEON:  Lillette Boxer. Rosaleah Person, M.D.  ANESTHESIA:  General  EBL:  Minimal  DRAINS: 24 x 6 French contour stent with string  LOCAL MEDICATIONS USED:  None  SPECIMEN:  Stone fragments to patient  INDICATION: Sydney Ford is a 58 year old female who presents at this time for urgent ureteroscopy for a symptomatic right ureteral stone, as well as asymptomatic right renal calculi. She presented about 2 weeks ago with a 2 to three-day history of significant right ureteral colic, and was found to have a 7-8 mm right midureteral stone as well as bilateral renal calculi. She tried passing these with medical expulsive therapy, that was unsuccessful, and she presented again yesterday with recurrent pain. She   is receiving chemotherapy for breast cancer. She had a cycle plan this morning, that was canceled due to her urgent surgical procedure. She is aware of the risks and complications of ureteroscopy and desires to proceed. Description of procedure: The patient was properly identified and marked (if applicable) in the holding area. They were then  taken to the operating room and placed on the table in a supine position. General anesthesia was then administered. Once fully anesthetized the patient was moved to the dorsolithotomy position and the genitalia and perineum were sterilely prepped and draped in standard fashion. An official timeout was then performed.   A 22 French panendoscope was advanced into her bladder. The bladder was drained, and then filled with irrigating fluid. Systematic survey of the bladder revealed normal  findings, with normal bilateral ureteral orifice his, no tumors, trabeculations or foreign bodies. The right ureteral orifice was cannulated with a 6 [place] open-ended catheter. Retrograde was performed.  Findings revealed a normal distal ureter, with a filling defect present at the distal aspect of the mid ureter, just above the pelvic brim. Above this, with a filling defect noted, there was persistent dilation of the ureter. I was unable to get a significant amount of contrast into the pelvic and calyceal system due to obstruction from the stone.   I then negotiated 0.035 inch sensor-tip guidewire past the stone, with good curl seen within the kidney. The cystoscope was removed. I then passed the ureteroscope up to the mid ureteral stone. This was a large enough stone that I could not remove it without fragmentation, which was then performed with a 200  holmium fiber with the laser energy applied to the stone, fragmenting it into perhaps 10 pieces there were then removed, following dilation of the distal ureter with the medium digital ureteral access sheath. Following extraction of all stone fragments from the ureter, I then passed the access catheter all the way up to the proximal ureter. I then used a flexible ureteroscope to access the pyelo-calyceal system on the right. I extracted prepped 5-6 small calculi. There was a large calculus in the lower pole calyx but could not be engaged, and I left this alone, since it was a lower pole stone and less likely to pass. After all calyces were again inspected and found to be stone free, I terminated the procedure. The scope was removed, as well as the access catheter. Over a guidewire, using cystoscopic guidance, I passed a 24 cm x 6 [place]  contour stent, leaving the string intact. Fragments were removed from the bladder with irrigation and saved.  Good proximal and distal curls were seen on the stent using fluoroscopic and cystoscopic guidance. The bladder was  drained, the scope removed, and the procedure terminated. The patient tolerated procedure well. She was returned to the PACU in stable condition.   PLAN OF CARE: Discharge to home after PACU

## 2013-06-30 NOTE — H&P (Signed)
H&P  Chief Complaint: Kidney stone  History of Present Illness: Sydney Ford is a 58 y.o. year old female who presents at this time for ureteroscopic management of a symptomatic right ureteral stone. She has a history of having passed about 11 stones in the past. She initially saw me earlier this month with a couple day history of right flank pain, nausea and vomiting. At the time I saw her, her symptoms were quiescent. She is 7 mm right mid ureteral stone as well as bilateral renal calculi. She went to try to pass this on her own, and as she was fairly asymptomatic did not keep her scheduled followup a week later. She came in yesterday with significant, persistent right flank pain, nausea and vomiting. She was given Toradol in the office, and despite feeling better, requested urgent management of her stone due to her symptomatic issues. She was scheduled for chemotherapy today, which has been postponed to get her on the schedule to have her stone management. She is aware of risks and complications of ureteroscopy including but not limited to ureteral injury, inability to adequately access the stone because of ureteral narrowing with possible stent placement, as well as infection and bleeding. She understands these and desires to proceed.  Past Medical History  Diagnosis Date  . Hypertension   . Hyperlipidemia   . Thyroid disease   . Osteoporosis   . Hypothyroidism     hashimoto's  . Depression   . Pneumonia     2014  . Cold (disease) 03/21/12    couple of weeks ago - pt states much better now  . History of kidney stones     "passed 9 stones"  . GERD (gastroesophageal reflux disease)     no meds except occas tums  . Headache(784.0)     occas migraine  . Cancer     new dx of right breast cancer - planning chemo first  . Arthritis     oa; lower back pain - hx of injury yrs ago-occas flare ups  . Anemia     2005 - related to heavy menses with the thyroid probl  . Chronic kidney disease      kidney stone    Past Surgical History  Procedure Laterality Date  . Cesarean section  1987  . Tonsillectomy    . Uterine ablation  2008    no menstrual periods since procedure  . Portacath placement N/A 03/23/2013    Procedure: INSERTION PORT-A-CATH;  Surgeon: Rolm Bookbinder, MD;  Location: [facility];  Service: General;  Laterality: N/A;    Home Medications:  Medications Prior to Admission  Medication Sig Dispense Refill  . atorvastatin (LIPITOR) 20 MG tablet Take 20 mg by mouth every evening.       . Cholecalciferol (VITAMIN D) 2000 UNITS CAPS Take 6,000 Units by mouth every morning.      Marland Kitchen dexamethasone (DECADRON) 4 MG tablet Take 4-8 mg by mouth See admin instructions. TAKE TWO TABLETS BY MOUTH ONCE A DAY ON THE DAY AFTER CHEMOTHERAPY AND THEN TAKE 2 TABLETS TWICE DAILY FOR 2 DAYS WITH FOOD.      Marland Kitchen lidocaine-prilocaine (EMLA) cream Apply 1 application topically daily as needed. port      . LORazepam (ATIVAN) 0.5 MG tablet Take 0.5 mg by mouth every 8 (eight) hours as needed (nausea).       . metoCLOPramide (REGLAN) 5 MG tablet Take 1 tablet (5 mg total) by mouth 3 (three) times daily before meals.  90 tablet  4  . omeprazole (PRILOSEC) 40 MG capsule Take 1 capsule (40 mg total) by mouth daily.  30 capsule  4  . ondansetron (ZOFRAN) 8 MG tablet Take 1 tablet (8 mg total) by mouth every 8 (eight) hours as needed for nausea or vomiting.  20 tablet  0  . promethazine (PHENERGAN) 25 MG suppository Place 1 suppository (25 mg total) rectally every 8 (eight) hours as needed for nausea or vomiting.  12 each  0  . sertraline (ZOLOFT) 100 MG tablet Take 1 tablet (100 mg total) by mouth at bedtime.  30 tablet  1  . tamsulosin (FLOMAX) 0.4 MG CAPS capsule Take 0.4 mg by mouth daily.      Marland Kitchen thyroid (ARMOUR) 90 MG tablet Take 90 mg by mouth every morning.      . traMADol (ULTRAM) 50 MG tablet Take 1-2 tablets (50-100 mg total) by mouth 3 (three) times daily as needed for moderate pain or severe  pain.  100 tablet  0  . traZODone (DESYREL) 50 MG tablet Take 1 tablet (50 mg total) by mouth at bedtime as needed for sleep.  30 tablet  1    Allergies:  Allergies  Allergen Reactions  . Codeine Itching and Nausea Only    REACTION: Nausea and itching  . Compazine [Prochlorperazine Edisylate] Other (See Comments)    Head aches   . Other     Close contact with Some recycled plastics cause whelps  . Tape Rash    Needs OPSITE dressing with port access    Family History  Problem Relation Age of Onset  . Heart failure Mother   . Heart attack Father   . Autoimmune disease Maternal Aunt     lupus, scelraderma, reynolds disease, and others  . Stroke Paternal Uncle   . Heart failure Maternal Grandmother   . Heart attack Maternal Grandfather   . Throat cancer Paternal Grandmother     dx late 109s; non smoker or drinker  . Heart attack Paternal Grandfather   . Schizophrenia Son 53  . Heart attack Paternal Uncle     Social History:  reports that she quit smoking about 28 years ago. She has never used smokeless tobacco. She reports that she does not drink alcohol or use illicit drugs.  ROS: A complete review of systems was performed.  All systems are negative except for pertinent findings as noted.  Physical Exam:  Vital signs in last 24 hours: Temp:  [98.4 F (36.9 C)] 98.4 F (36.9 C) (04/16 0710) Pulse Rate:  [84] 84 (04/16 0710) Resp:  [20] 20 (04/16 0710) BP: (154)/(84) 154/84 mmHg (04/16 0710) SpO2:  [100 %] 100 % (04/16 0710) General:  Alert and oriented, No acute distress HEENT: Normocephalic, atraumatic Neck: No JVD or lymphadenopathy Cardiovascular: Regular rate and rhythm Lungs: Clear bilaterally Abdomen: Soft, nontender, nondistended, no abdominal masses Back: No CVA tenderness Extremities: No edema Neurologic: Grossly intact  Laboratory Data:  Results for orders placed during the hospital encounter of 06/30/13 (from the past 24 hour(s))  CBC     Status:  Abnormal   Collection Time    06/30/13  7:47 AM      Result Value Ref Range   WBC 4.2  4.0 - 10.5 K/uL   RBC 3.17 (*) 3.87 - 5.11 MIL/uL   Hemoglobin 10.1 (*) 12.0 - 15.0 g/dL   HCT 29.9 (*) 36.0 - 46.0 %   MCV 94.3  78.0 - 100.0 fL   MCH  31.9  26.0 - 34.0 pg   MCHC 33.8  30.0 - 36.0 g/dL   RDW 18.1 (*) 11.5 - 15.5 %   Platelets 125 (*) 150 - 400 K/uL  BASIC METABOLIC PANEL     Status: Abnormal   Collection Time    06/30/13  7:47 AM      Result Value Ref Range   Sodium 138  137 - 147 mEq/L   Potassium 4.1  3.7 - 5.3 mEq/L   Chloride 101  96 - 112 mEq/L   CO2 25  19 - 32 mEq/L   Glucose, Bld 88  70 - 99 mg/dL   BUN 23  6 - 23 mg/dL   Creatinine, Ser 1.22 (*) 0.50 - 1.10 mg/dL   Calcium 8.9  8.4 - 10.5 mg/dL   GFR calc non Af Amer 48 (*) >90 mL/min   GFR calc Af Amer 55 (*) >90 mL/min   No results found for this or any previous visit (from the past 240 hour(s)). Creatinine:  Recent Labs  06/23/13 1024 06/30/13 0747  CREATININE 1.1 1.22*    Radiologic Imaging: No results found.  Impression/Assessment:  Symptomatic right midureteral stone with right and left renal calculi  Plan:  Right-sided ureteroscopy following retrograde ureteropyelogram, holmium laser and extraction of right ureteral and possible right renal calculi.  Franchot Gallo 06/30/2013, 8:53 AM  Lillette Boxer. Lynleigh Kovack MD

## 2013-06-30 NOTE — Anesthesia Postprocedure Evaluation (Signed)
  Anesthesia Post-op Note  Patient: Sydney Ford  Procedure(s) Performed: Procedure(s) (LRB): CYSTOSCOPY WITH RETROGRADE PYELOGRAM, URETEROSCOPY, STONE EXTRACTION AND  STENT PLACEMENT (Right) HOLMIUM LASER APPLICATION (Right)  Patient Location: PACU  Anesthesia Type: General  Level of Consciousness: awake and alert   Airway and Oxygen Therapy: Patient Spontanous Breathing  Post-op Pain: mild  Post-op Assessment: Post-op Vital signs reviewed, Patient's Cardiovascular Status Stable, Respiratory Function Stable, Patent Airway and No signs of Nausea or vomiting  Last Vitals:  Filed Vitals:   06/30/13 1204  BP: 152/76  Pulse: 78  Temp: 36.6 C  Resp: 15    Post-op Vital Signs: stable   Complications: No apparent anesthesia complications

## 2013-06-30 NOTE — Anesthesia Procedure Notes (Signed)
Procedure Name: LMA Insertion Date/Time: 06/30/2013 9:46 AM Performed by: Ofilia Neas Pre-anesthesia Checklist: Patient identified, Patient being monitored, Timeout performed, Emergency Drugs available and Suction available Patient Re-evaluated:Patient Re-evaluated prior to inductionOxygen Delivery Method: Circle system utilized Preoxygenation: Pre-oxygenation with 100% oxygen Intubation Type: IV induction Ventilation: Mask ventilation without difficulty LMA: LMA with gastric port inserted LMA Size: 4.0 Number of attempts: 1 Placement Confirmation: positive ETCO2 and breath sounds checked- equal and bilateral Tube secured with: Tape (paper tape) Dental Injury: Teeth and Oropharynx as per pre-operative assessment

## 2013-06-30 NOTE — Transfer of Care (Signed)
Immediate Anesthesia Transfer of Care Note  Patient: Sydney Ford  Procedure(s) Performed: Procedure(s): CYSTOSCOPY WITH RETROGRADE PYELOGRAM, URETEROSCOPY, STONE EXTRACTION AND  STENT PLACEMENT (Right) HOLMIUM LASER APPLICATION (Right)  Patient Location: PACU  Anesthesia Type:General  Level of Consciousness: awake, alert , oriented and patient cooperative  Airway & Oxygen Therapy: Patient Spontanous Breathing and Patient connected to face mask oxygen  Post-op Assessment: Report given to PACU RN, Post -op Vital signs reviewed and stable and Patient moving all extremities X 4  Post vital signs: stable  Complications: No apparent anesthesia complications

## 2013-06-30 NOTE — Anesthesia Preprocedure Evaluation (Signed)
Anesthesia Evaluation  Patient identified by MRN, date of birth, ID band Patient awake    Reviewed: Allergy & Precautions, H&P , NPO status , Patient's Chart, lab work & pertinent test results  Airway Mallampati: II TM Distance: >3 FB Neck ROM: Full    Dental no notable dental hx.    Pulmonary asthma , former smoker,  breath sounds clear to auscultation  Pulmonary exam normal       Cardiovascular hypertension, Rhythm:Regular Rate:Normal     Neuro/Psych negative neurological ROS  negative psych ROS   GI/Hepatic negative GI ROS, Neg liver ROS,   Endo/Other  Hypothyroidism Morbid obesity  Renal/GU negative Renal ROS  negative genitourinary   Musculoskeletal negative musculoskeletal ROS (+)   Abdominal   Peds negative pediatric ROS (+)  Hematology negative hematology ROS (+) anemia ,   Anesthesia Other Findings   Reproductive/Obstetrics negative OB ROS                           Anesthesia Physical Anesthesia Plan  ASA: III  Anesthesia Plan: General   Post-op Pain Management:    Induction: Intravenous  Airway Management Planned: LMA  Additional Equipment:   Intra-op Plan:   Post-operative Plan:   Informed Consent: I have reviewed the patients History and Physical, chart, labs and discussed the procedure including the risks, benefits and alternatives for the proposed anesthesia with the patient or authorized representative who has indicated his/her understanding and acceptance.   Dental advisory given  Plan Discussed with: CRNA and Surgeon  Anesthesia Plan Comments:         Anesthesia Quick Evaluation

## 2013-06-30 NOTE — Discharge Instructions (Signed)
POSTOPERATIVE CARE AFTER URETEROSCOPY  Stent management  *Stents are often left in after ureteroscopy and stone treatment. If left in, they often cause urinary frequency, urgency, occasional blood in the urine, as well as flank discomfort with urination. These are all expected issues, and should resolve after the stent is removed. *Often times, a small thread is left on the end of the stent, and brought out through the urethra. If so, this is used to remove the stent, making it unnecessary to look in the bladder with a scope in the office to remove the stent. If a thread is left on, did not pull on it until instructed. It is okay to remove the stent on Monday morning, April 20.  Diet  Once you have adequately recovered from anesthesia, you may gradually advance your diet, as tolerated, to your regular diet.  Activities  You may gradually increase your activities to your normal unrestricted level the day following your procedure.  Medications  You should resume all preoperative medications. If you are on aspirin-like compounds, you should not resume these until the blood clears from your urine. If given an antibiotic by the surgeon, take these until they are completed. You may also be given, if you have a stent, medications to decrease the urinary frequency and urgency.  Pain  After ureteroscopy, there may be some pain on the side of the scope. Take your pain medicine for this. Usually, this pain resolves within a day or 2.  Fever  Please report any fever over 100 to the doctor.

## 2013-07-01 ENCOUNTER — Encounter (HOSPITAL_COMMUNITY): Payer: Self-pay | Admitting: Urology

## 2013-07-07 ENCOUNTER — Ambulatory Visit (HOSPITAL_BASED_OUTPATIENT_CLINIC_OR_DEPARTMENT_OTHER): Payer: BC Managed Care – PPO | Admitting: Physician Assistant

## 2013-07-07 ENCOUNTER — Encounter: Payer: Self-pay | Admitting: Physician Assistant

## 2013-07-07 ENCOUNTER — Other Ambulatory Visit: Payer: Self-pay | Admitting: Physician Assistant

## 2013-07-07 ENCOUNTER — Other Ambulatory Visit: Payer: Self-pay | Admitting: Oncology

## 2013-07-07 ENCOUNTER — Other Ambulatory Visit (HOSPITAL_BASED_OUTPATIENT_CLINIC_OR_DEPARTMENT_OTHER): Payer: BC Managed Care – PPO

## 2013-07-07 ENCOUNTER — Ambulatory Visit (HOSPITAL_BASED_OUTPATIENT_CLINIC_OR_DEPARTMENT_OTHER): Payer: BC Managed Care – PPO

## 2013-07-07 VITALS — BP 143/87 | HR 72 | Temp 98.6°F | Resp 18 | Ht 62.0 in | Wt 220.3 lb

## 2013-07-07 DIAGNOSIS — C50419 Malignant neoplasm of upper-outer quadrant of unspecified female breast: Secondary | ICD-10-CM

## 2013-07-07 DIAGNOSIS — R112 Nausea with vomiting, unspecified: Secondary | ICD-10-CM

## 2013-07-07 DIAGNOSIS — C50411 Malignant neoplasm of upper-outer quadrant of right female breast: Secondary | ICD-10-CM

## 2013-07-07 DIAGNOSIS — Z5111 Encounter for antineoplastic chemotherapy: Secondary | ICD-10-CM

## 2013-07-07 DIAGNOSIS — M1710 Unilateral primary osteoarthritis, unspecified knee: Secondary | ICD-10-CM

## 2013-07-07 DIAGNOSIS — M810 Age-related osteoporosis without current pathological fracture: Secondary | ICD-10-CM

## 2013-07-07 DIAGNOSIS — D649 Anemia, unspecified: Secondary | ICD-10-CM

## 2013-07-07 DIAGNOSIS — E039 Hypothyroidism, unspecified: Secondary | ICD-10-CM

## 2013-07-07 DIAGNOSIS — N200 Calculus of kidney: Secondary | ICD-10-CM

## 2013-07-07 DIAGNOSIS — D696 Thrombocytopenia, unspecified: Secondary | ICD-10-CM

## 2013-07-07 DIAGNOSIS — IMO0002 Reserved for concepts with insufficient information to code with codable children: Secondary | ICD-10-CM

## 2013-07-07 LAB — COMPREHENSIVE METABOLIC PANEL (CC13)
ALBUMIN: 3.3 g/dL — AB (ref 3.5–5.0)
ALT: 33 U/L (ref 0–55)
ANION GAP: 11 meq/L (ref 3–11)
AST: 27 U/L (ref 5–34)
Alkaline Phosphatase: 75 U/L (ref 40–150)
BILIRUBIN TOTAL: 0.87 mg/dL (ref 0.20–1.20)
BUN: 14.4 mg/dL (ref 7.0–26.0)
CO2: 25 meq/L (ref 22–29)
Calcium: 9.3 mg/dL (ref 8.4–10.4)
Chloride: 106 mEq/L (ref 98–109)
Creatinine: 1.2 mg/dL — ABNORMAL HIGH (ref 0.6–1.1)
GLUCOSE: 113 mg/dL (ref 70–140)
POTASSIUM: 3.7 meq/L (ref 3.5–5.1)
Sodium: 142 mEq/L (ref 136–145)
TOTAL PROTEIN: 6.3 g/dL — AB (ref 6.4–8.3)

## 2013-07-07 LAB — CBC WITH DIFFERENTIAL/PLATELET
BASO%: 0 % (ref 0.0–2.0)
BASOS ABS: 0 10*3/uL (ref 0.0–0.1)
EOS ABS: 0 10*3/uL (ref 0.0–0.5)
EOS%: 0.6 % (ref 0.0–7.0)
HEMATOCRIT: 27.3 % — AB (ref 34.8–46.6)
HEMOGLOBIN: 8.9 g/dL — AB (ref 11.6–15.9)
LYMPH%: 20.1 % (ref 14.0–49.7)
MCH: 31.4 pg (ref 25.1–34.0)
MCHC: 32.6 g/dL (ref 31.5–36.0)
MCV: 96.5 fL (ref 79.5–101.0)
MONO#: 0.2 10*3/uL (ref 0.1–0.9)
MONO%: 9.5 % (ref 0.0–14.0)
NEUT#: 1.3 10*3/uL — ABNORMAL LOW (ref 1.5–6.5)
NEUT%: 69.8 % (ref 38.4–76.8)
Platelets: 115 10*3/uL — ABNORMAL LOW (ref 145–400)
RBC: 2.83 10*6/uL — ABNORMAL LOW (ref 3.70–5.45)
RDW: 16.8 % — ABNORMAL HIGH (ref 11.2–14.5)
WBC: 1.8 10*3/uL — ABNORMAL LOW (ref 3.9–10.3)
lymph#: 0.4 10*3/uL — ABNORMAL LOW (ref 0.9–3.3)
nRBC: 0 % (ref 0–0)

## 2013-07-07 MED ORDER — DEXAMETHASONE SODIUM PHOSPHATE 20 MG/5ML IJ SOLN
INTRAMUSCULAR | Status: AC
  Filled 2013-07-07: qty 5

## 2013-07-07 MED ORDER — HEPARIN SOD (PORK) LOCK FLUSH 100 UNIT/ML IV SOLN
500.0000 [IU] | Freq: Once | INTRAVENOUS | Status: AC | PRN
  Administered 2013-07-07: 500 [IU]
  Filled 2013-07-07: qty 5

## 2013-07-07 MED ORDER — FAMOTIDINE IN NACL 20-0.9 MG/50ML-% IV SOLN
20.0000 mg | Freq: Once | INTRAVENOUS | Status: AC
  Administered 2013-07-07: 20 mg via INTRAVENOUS

## 2013-07-07 MED ORDER — FAMOTIDINE IN NACL 20-0.9 MG/50ML-% IV SOLN
INTRAVENOUS | Status: AC
  Filled 2013-07-07: qty 50

## 2013-07-07 MED ORDER — ONDANSETRON 16 MG/50ML IVPB (CHCC)
16.0000 mg | Freq: Once | INTRAVENOUS | Status: AC
  Administered 2013-07-07: 16 mg via INTRAVENOUS

## 2013-07-07 MED ORDER — PACLITAXEL CHEMO INJECTION 300 MG/50ML
80.0000 mg/m2 | Freq: Once | INTRAVENOUS | Status: AC
  Administered 2013-07-07: 168 mg via INTRAVENOUS
  Filled 2013-07-07: qty 28

## 2013-07-07 MED ORDER — ONDANSETRON 16 MG/50ML IVPB (CHCC)
INTRAVENOUS | Status: AC
  Filled 2013-07-07: qty 16

## 2013-07-07 MED ORDER — DEXAMETHASONE SODIUM PHOSPHATE 20 MG/5ML IJ SOLN
20.0000 mg | Freq: Once | INTRAMUSCULAR | Status: AC
  Administered 2013-07-07: 20 mg via INTRAVENOUS

## 2013-07-07 MED ORDER — DIPHENHYDRAMINE HCL 50 MG/ML IJ SOLN
INTRAMUSCULAR | Status: AC
  Filled 2013-07-07: qty 1

## 2013-07-07 MED ORDER — DIPHENHYDRAMINE HCL 50 MG/ML IJ SOLN
25.0000 mg | Freq: Once | INTRAMUSCULAR | Status: AC
  Administered 2013-07-07: 25 mg via INTRAVENOUS

## 2013-07-07 MED ORDER — SODIUM CHLORIDE 0.9 % IJ SOLN
10.0000 mL | INTRAMUSCULAR | Status: DC | PRN
  Administered 2013-07-07: 10 mL
  Filled 2013-07-07: qty 10

## 2013-07-07 MED ORDER — SODIUM CHLORIDE 0.9 % IV SOLN
210.8000 mg | Freq: Once | INTRAVENOUS | Status: AC
  Administered 2013-07-07: 210 mg via INTRAVENOUS
  Filled 2013-07-07: qty 21

## 2013-07-07 MED ORDER — SODIUM CHLORIDE 0.9 % IV SOLN
Freq: Once | INTRAVENOUS | Status: AC
  Administered 2013-07-07: 13:00:00 via INTRAVENOUS

## 2013-07-07 NOTE — Progress Notes (Signed)
Called [employer], verbal order received and read back to treat despite counts.

## 2013-07-07 NOTE — Progress Notes (Signed)
Discharged at 1530, ambulatory to home in no distress.

## 2013-07-07 NOTE — Progress Notes (Signed)
Sydney Ford  [phone] [phone]) [phone]     ID: [name] OB: [birth year]  MR#: [id number]  VQQ#:[id number]  PCP: Sydney Bolt, MD GYN:   SU: Sydney Bookbinder, MD OTHER MD: Sydney Blossom, MD  CHIEF COMPLAINT:  Right breast cancer/neoadjuvant chemotherapy   HISTORY OF PRESENT ILLNESS: Sydney Ford (pronounced "line 'em") herself noted a change in her right breast sometime October 2014 but "my breasts are like bean bags" and she initially ignored it. After while she began to feel a pole when she lay on her right side at night so she scheduled herself for screening mammography at [facility] 02/08/2013 (prior mammogram had been April 2011), and this did show a possible mass in the right breast. Unilateral right diagnostic mammography and ultrasonography 02/28/2013 confirmed an irregular mass in the right breast upper outer quadrant measuring 2.4 cm. This was palpable at the 11:00 position. Ultrasound showed an irregular hypoechoic mass measuring 1.5 cm with no abnormal adenopathy in the right axilla.  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  The patient met with Dr. Donne Ford 03/14/2013 and he recommended primary systemic chemotherapy to decrease the size of the tumor and increase the chance of lumpectomy.   Subsequent history is as detailed below.  INTERVAL HISTORY: Sydney Ford returns today for followup of her locally advanced right breast cancer. She is due for her fourth of 12 planned weekly doses of carboplatin/paclitaxel today. There was a one-week delay secondary to a diagnosis of kidney stones, and she actually underwent a cystoscopy with pyelogram uteroscopy stone extraction, and stent placement last week on 06/30/2013 in the care of Dr.  Diona Ford.  She tells me the pain has been "horrible", but she is beginning to feel a little better. She is ready to resume treatment today.  As previously recommended by Dr. Jana Ford. Sydney Ford did start utilizing metoclopramide, 5 mg 3 times daily before meals after her treatment 2 weeks ago. She also uses half a tablet of lorazepam occasionally for nausea, and she finds that these measures have been helpful.   REVIEW OF SYSTEMS: Sydney Ford has had no fevers or chills. She denies any skin changes or rashes. She did have some hematuria but this has resolved, and currently she denies any signs of abnormal bruising or bleeding. Her energy level is low. She's had no mouth ulcers or oral sensitivity. She currently denies any nausea. She's had no change in bowel habits. She denies any increased cough or shortness of breath and has had no chest pain or palpitations. She's had no abnormal headaches or dizziness. She denies any unusual myalgias, arthralgias, bony pain, peripheral swelling, or peripheral neuropathy.  A detailed review of systems is otherwise stable.   PAST MEDICAL HISTORY: Past Medical History  Diagnosis Date  . Hypertension   . Hyperlipidemia   . Thyroid disease   . Osteoporosis   . Hypothyroidism     hashimoto's  . Depression   . Pneumonia     2014  . Cold (disease) 03/21/12    couple of weeks ago - pt states much better now  . History of kidney stones     "passed 9 stones"  . GERD (gastroesophageal reflux disease)     no meds except occas tums  . Headache(784.0)     occas migraine  . Cancer  new dx of right breast cancer - planning chemo first  . Arthritis     oa; lower back pain - hx of injury yrs ago-occas flare ups  . Anemia     2005 - related to heavy menses with the thyroid probl  . Chronic kidney disease     kidney stone    PAST SURGICAL HISTORY:  (Updated 07/07/2013) Past Surgical History  Procedure Laterality Date  . Cesarean section  1987  . Tonsillectomy    .  Uterine ablation  2008    no menstrual periods since procedure  . Portacath placement N/A 03/23/2013    Procedure: INSERTION PORT-A-CATH;  Surgeon: Sydney Bookbinder, MD;  Location: [facility];  Service: General;  Laterality: N/A;  . Cystoscopy with retrograde pyelogram, ureteroscopy and stent placement Right 06/30/2013    Procedure: CYSTOSCOPY WITH RETROGRADE PYELOGRAM, URETEROSCOPY, STONE EXTRACTION AND  STENT PLACEMENT;  Surgeon: Franchot Gallo, MD;  Location: [facility];  Service: Urology;  Laterality: Right;  . Holmium laser application Right 5/63/1497    Procedure: HOLMIUM LASER APPLICATION;  Surgeon: Franchot Gallo, MD;  Location: [facility];  Service: Urology;  Laterality: Right;    FAMILY HISTORY Family History  Problem Relation Age of Onset  . Heart failure Mother   . Heart attack Father   . Autoimmune disease Maternal Aunt     lupus, scelraderma, reynolds disease, and others  . Stroke Paternal Uncle   . Heart failure Maternal Grandmother   . Heart attack Maternal Grandfather   . Throat cancer Paternal Grandmother     dx late 67s; non smoker or drinker  . Heart attack Paternal Grandfather   . Schizophrenia Son 2  . Heart attack Paternal Uncle    the patient's father died at the age of 62 from a myocardial infarction. The patient's mother died at the age of 69 with congestive heart failure. She also had a history of lupus. The patient had no brothers or sisters. There is no history of breast or ovarian cancer in the family to her knowledge.  GYNECOLOGIC HISTORY:  Menarche age 64, first live birth age 53. She is GX P2. She underwent endometrial ablation in 2009 and has not had a period since that time. She never took hormone replacement. She did use birth control pills remotely with no complications  SOCIAL HISTORY: (Updated 07/07/2013) Sydney Ford works at [employer] for a variety of long-term clients. She does not work for a company. Her husband Sydney Ford is semiretired. He  works for a company that [employer] data on [place]. Son Sydney Ford is in [place] and works in Architect. Son Sydney Ford lives at home and is disabled with a diagnosis of paranoid schizophrenia. The patient has no grandchildren. She is not a church attender    ADVANCED DIRECTIVES: Not in place   HEALTH MAINTENANCE: (Updated 07/07/2013) History  Substance Use Topics  . Smoking status: Former Smoker    Quit date: 03/17/1985  . Smokeless tobacco: Never Used  . Alcohol Use: No     Colonoscopy: Never  PAP: Remote  Bone density: November 2014, showed osteoporosis   according to the patient's report  Lipid panel:  Not on file, Dr. Wilson Singer   Allergies  Allergen Reactions  . Codeine Itching and Nausea Only    REACTION: Nausea and itching  . Compazine [Prochlorperazine Edisylate] Other (See Comments)    Head aches   . Other     Close contact with Some recycled plastics cause whelps  .  Tape Rash    Needs OPSITE dressing with port access    Current Outpatient Prescriptions  Medication Sig Dispense Refill  . atorvastatin (LIPITOR) 20 MG tablet Take 20 mg by mouth every evening.       . Cholecalciferol (VITAMIN D) 2000 UNITS CAPS Take 6,000 Units by mouth every morning.      Marland Kitchen dexamethasone (DECADRON) 4 MG tablet Take 4-8 mg by mouth See admin instructions. TAKE TWO TABLETS BY MOUTH ONCE A DAY ON THE DAY AFTER CHEMOTHERAPY AND THEN TAKE 2 TABLETS TWICE DAILY FOR 2 DAYS WITH FOOD.      Marland Kitchen lidocaine-prilocaine (EMLA) cream Apply 1 application topically daily as needed. port      . metoCLOPramide (REGLAN) 5 MG tablet Take 1 tablet (5 mg total) by mouth 3 (three) times daily before meals.  90 tablet  4  . omeprazole (PRILOSEC) 40 MG capsule Take 1 capsule (40 mg total) by mouth daily.  30 capsule  4  . oxybutynin (DITROPAN) 5 MG tablet Take 1 tablet (5 mg total) by mouth 3 (three) times daily.  30 tablet  1  . PRESCRIPTION MEDICATION Chemotherapy at [facility]      . sertraline (ZOLOFT) 100 MG  tablet Take 1 tablet (100 mg total) by mouth at bedtime.  30 tablet  1  . tamsulosin (FLOMAX) 0.4 MG CAPS capsule       . thyroid (ARMOUR) 90 MG tablet Take 90 mg by mouth every morning.      . traZODone (DESYREL) 50 MG tablet Take 1 tablet (50 mg total) by mouth at bedtime as needed for sleep.  30 tablet  1  . LORazepam (ATIVAN) 0.5 MG tablet Take 0.5 mg by mouth every 8 (eight) hours as needed (nausea).       . nitrofurantoin, macrocrystal-monohydrate, (MACROBID) 100 MG capsule       . ondansetron (ZOFRAN) 8 MG tablet Take 1 tablet (8 mg total) by mouth every 8 (eight) hours as needed for nausea or vomiting.  20 tablet  0  . promethazine (PHENERGAN) 25 MG suppository Place 1 suppository (25 mg total) rectally every 8 (eight) hours as needed for nausea or vomiting.  12 each  0  . traMADol (ULTRAM) 50 MG tablet Take 1-2 tablets (50-100 mg total) by mouth 3 (three) times daily as needed for moderate pain or severe pain.  100 tablet  0   No current facility-administered medications for this visit.    OBJECTIVE: Middle-aged white woman who appears tired but is in no acute distress Filed Vitals:   07/07/13 1000  BP: 143/87  Pulse: 72  Temp: 98.6 F (37 C)  Resp: 18     Body mass index is 40.28 kg/(m^2).    ECOG FS: 1 Filed Weights   07/07/13 1000  Weight: 220 lb 4.8 oz (99.927 kg)   Physical Exam: HEENT:  Sclerae anicteric.  Oropharynx clear, pink, and moist with no ulcerations or evidence of oropharyngeal candidiasis. Neck supple, trachea midline..  NODES:  No cervical or supraclavicular lymphadenopathy palpated.  BREAST EXAM:  Deferred. Axillae are benign bilaterally with no palpable lymphadenopathy. LUNGS:  Clear to auscultation bilaterally with good excursion.  No wheezes or rhonchi HEART:  Regular rate and rhythm. No murmur  ABDOMEN:  Soft, obese, nontender. No organomegaly or masses palpated. Positive bowel sounds.  MSK:  No focal spinal tenderness to palpation. Good range of  motion bilaterally in the upper extremities. EXTREMITIES:  No peripheral edema.  No lymphedema noted in  the right upper extremity. SKIN:  No visible rashes. No excessive ecchymoses. No petechiae. Mild pallor. Good skin turgor. NEURO:  Nonfocal. Well oriented.  Appropriate affect.     LAB RESULTS:  Lab Results  Component Value Date   WBC 1.8* 07/07/2013   NEUTROABS 1.3* 07/07/2013   HGB 8.9* 07/07/2013   HCT 27.3* 07/07/2013   MCV 96.5 07/07/2013   PLT 115* 07/07/2013      Chemistry      Component Value Date/Time   NA 142 07/07/2013 0946   NA 138 06/30/2013 0747   K 3.7 07/07/2013 0946   K 4.1 06/30/2013 0747   CL 101 06/30/2013 0747   CO2 25 07/07/2013 0946   CO2 25 06/30/2013 0747   BUN 14.4 07/07/2013 0946   BUN 23 06/30/2013 0747   CREATININE 1.2* 07/07/2013 0946   CREATININE 1.22* 06/30/2013 0747      Component Value Date/Time   CALCIUM 9.3 07/07/2013 0946   CALCIUM 8.9 06/30/2013 0747   ALKPHOS 75 07/07/2013 0946   ALKPHOS 65 05/27/2013 0530   AST 27 07/07/2013 0946   AST 7 05/27/2013 0530   ALT 33 07/07/2013 0946   ALT 11 05/27/2013 0530   BILITOT 0.87 07/07/2013 0946   BILITOT 0.5 05/27/2013 0530       STUDIES:  CT Abdomen Pelvis Wo Contrast 06/15/2013  CLINICAL DATA: Abdominal pain, history of kidney stones and breast  cancer.  EXAM:  CT ABDOMEN AND PELVIS WITHOUT CONTRAST  TECHNIQUE:  Multidetector CT imaging of the abdomen and pelvis was performed  following the standard protocol without intravenous contrast.  COMPARISON: US RENAL dated 05/30/2013  FINDINGS:  Limited view of the lung bases are clear. Included heart and  pericardium are unremarkable.  Moderate right hydroureteronephrosis to the level of the mid ureter  where a 7 x 6 mm calculus is seen. Multiple bilateral  nephrolithiasis, including 6 mm right interpolar calculus,  contiguous calculi in the right lower pole measuring 6 mm. 6 mm left  lower pole renal calculus. No left hydronephrosis.  The liver,  pancreas, gallbladder and adrenal glands are  unremarkable. Coarse calcification in the spleen which is otherwise  unremarkable.  Stomach, small and large bowel are normal in course and caliber  without inflammatory changes. Normal appendix. No intraperitoneal  free fluid or free air. Aortoiliac vessels are normal course and  caliber. Internal reproductive organs are unremarkable. Urinary  bladder is decompressed harboring no intravesicular calculi.  Phleboliths in the pelvis.  Moderate degenerative changes of pubic symphysis. Grade 1 L5-S1  anterolisthesis on degenerative basis with severe L4-5 degenerative  disc disease. Small fat containing umbilical hernia.  IMPRESSION:  Moderate right hydroureteronephrosis to the level of the mid ureter  where a 7 x 6 mm calculus is seen. Bilateral residual  nephrolithiasis as described above. No left hydronephrosis.  Electronically Signed  By: Awilda Metro  On: 06/15/2013 05:22    ASSESSMENT: 58 y.o. 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  (2)  being treated in the neoadjuvant setting, the plan being to complete 4 dose dense cycles of doxorubicin/cyclophosphamide, first dose on 03/31/2013 and final dose on 05/19/2013. Cycle 4 was delayed one week because of bad weather.  Neulasta on day 2 for granulocyte support.   (3)  doxorubicin/cyclophosphamide is being followed by 12 weekly doses of  carboplatin/paclitaxel prior to definitive surgery, first dose 06/09/2013.  (4)  recurrent nephrolithiasis  PLAN: The majority of our 25 minute appointment today was spent reviewing Sydney Ford's recent health concerns, reviewing her current labs, discussing her treatment plan, and coordinating care.   Dr. Jana Ford and I have reviewed Sydney Ford's case today, including her current labs. He is comfortable  with her proceeding with treatment today for her fourth weekly dose of carboplatin/paclitaxel as scheduled. Of course we will continue to follow her labs very closely, and these will be drawn on a weekly basis. We expect to see her hemoglobin increase, as the recent decreases likely secondary to her recent surgical procedure.   I will see Sydney Ford again next week on April 29 in anticipation of her fifth weekly dose of treatment. Our plan is to complete a total of 12 weekly doses prior to definitive surgery. This plan has been reviewed with Sydney Ford and she voices her understanding and agreement. She does know to call with any changes or problems.  Theotis Burrow, PA-C    07/07/2013

## 2013-07-07 NOTE — Patient Instructions (Signed)
[facility] Discharge Instructions for Patients Receiving Chemotherapy  Today you received the following chemotherapy agents Taxol, Carboplatin.  To help prevent nausea and vomiting after your treatment, we encourage you to take your nausea medication zofran 8 mg, phenergan 25 mg suppository and lorazepam 0.5 mg every 8 hrs if needed.   If you develop nausea and vomiting that is not controlled by your nausea medication, call the clinic.   BELOW ARE SYMPTOMS THAT SHOULD BE REPORTED IMMEDIATELY:  *FEVER GREATER THAN 100.5 F  *CHILLS WITH OR WITHOUT FEVER  NAUSEA AND VOMITING THAT IS NOT CONTROLLED WITH YOUR NAUSEA MEDICATION  *UNUSUAL SHORTNESS OF BREATH  *UNUSUAL BRUISING OR BLEEDING  TENDERNESS IN MOUTH AND THROAT WITH OR WITHOUT PRESENCE OF ULCERS  *URINARY PROBLEMS  *BOWEL PROBLEMS  UNUSUAL RASH Items with * indicate a potential emergency and should be followed up as soon as possible.  Feel free to call the clinic you have any questions or concerns. The clinic phone number is [phone].

## 2013-07-11 ENCOUNTER — Other Ambulatory Visit: Payer: Self-pay | Admitting: Physician Assistant

## 2013-07-11 ENCOUNTER — Telehealth: Payer: Self-pay | Admitting: Oncology

## 2013-07-11 ENCOUNTER — Other Ambulatory Visit: Payer: Self-pay | Admitting: Oncology

## 2013-07-11 NOTE — Telephone Encounter (Signed)
LMONVM OF THE PT ADVISING HER TO PICK UP HER MAY SCHEDULE THE NEXT TIME SHE COMES IN FOR HER APPTS.

## 2013-07-13 ENCOUNTER — Ambulatory Visit (HOSPITAL_BASED_OUTPATIENT_CLINIC_OR_DEPARTMENT_OTHER): Payer: BC Managed Care – PPO | Admitting: Physician Assistant

## 2013-07-13 ENCOUNTER — Telehealth: Payer: Self-pay | Admitting: *Deleted

## 2013-07-13 ENCOUNTER — Other Ambulatory Visit: Payer: Self-pay | Admitting: Oncology

## 2013-07-13 ENCOUNTER — Ambulatory Visit (HOSPITAL_BASED_OUTPATIENT_CLINIC_OR_DEPARTMENT_OTHER): Payer: BC Managed Care – PPO

## 2013-07-13 ENCOUNTER — Other Ambulatory Visit (HOSPITAL_BASED_OUTPATIENT_CLINIC_OR_DEPARTMENT_OTHER): Payer: BC Managed Care – PPO

## 2013-07-13 ENCOUNTER — Telehealth: Payer: Self-pay | Admitting: Oncology

## 2013-07-13 ENCOUNTER — Encounter: Payer: Self-pay | Admitting: Physician Assistant

## 2013-07-13 VITALS — BP 165/81 | HR 99 | Temp 98.3°F | Resp 18 | Ht 62.0 in | Wt 219.3 lb

## 2013-07-13 DIAGNOSIS — C50419 Malignant neoplasm of upper-outer quadrant of unspecified female breast: Secondary | ICD-10-CM

## 2013-07-13 DIAGNOSIS — R112 Nausea with vomiting, unspecified: Secondary | ICD-10-CM

## 2013-07-13 DIAGNOSIS — C50411 Malignant neoplasm of upper-outer quadrant of right female breast: Secondary | ICD-10-CM

## 2013-07-13 DIAGNOSIS — F419 Anxiety disorder, unspecified: Secondary | ICD-10-CM

## 2013-07-13 DIAGNOSIS — Z5111 Encounter for antineoplastic chemotherapy: Secondary | ICD-10-CM

## 2013-07-13 DIAGNOSIS — D649 Anemia, unspecified: Secondary | ICD-10-CM

## 2013-07-13 DIAGNOSIS — N200 Calculus of kidney: Secondary | ICD-10-CM

## 2013-07-13 DIAGNOSIS — D696 Thrombocytopenia, unspecified: Secondary | ICD-10-CM

## 2013-07-13 DIAGNOSIS — Z171 Estrogen receptor negative status [ER-]: Secondary | ICD-10-CM

## 2013-07-13 LAB — CBC WITH DIFFERENTIAL/PLATELET
BASO%: 0 % (ref 0.0–2.0)
BASO%: 0 % (ref 0.0–2.0)
BASOS ABS: 0 10*3/uL (ref 0.0–0.1)
Basophils Absolute: 0 10*3/uL (ref 0.0–0.1)
EOS ABS: 0 10*3/uL (ref 0.0–0.5)
EOS%: 1.8 % (ref 0.0–7.0)
EOS%: 1.9 % (ref 0.0–7.0)
Eosinophils Absolute: 0 10*3/uL (ref 0.0–0.5)
HCT: 28.2 % — ABNORMAL LOW (ref 34.8–46.6)
HCT: 26.8 % — ABNORMAL LOW (ref 34.8–46.6)
HEMOGLOBIN: 8.9 g/dL — AB (ref 11.6–15.9)
HGB: 9.4 g/dL — ABNORMAL LOW (ref 11.6–15.9)
LYMPH#: 0.5 10*3/uL — AB (ref 0.9–3.3)
LYMPH#: 0.5 10*3/uL — AB (ref 0.9–3.3)
LYMPH%: 20.7 % (ref 14.0–49.7)
LYMPH%: 22.4 % (ref 14.0–49.7)
MCH: 31.4 pg (ref 25.1–34.0)
MCH: 31.2 pg (ref 25.1–34.0)
MCHC: 33.3 g/dL (ref 31.5–36.0)
MCHC: 33.2 g/dL (ref 31.5–36.0)
MCV: 94.3 fL (ref 79.5–101.0)
MCV: 94 fL (ref 79.5–101.0)
MONO#: 0.1 10*3/uL (ref 0.1–0.9)
MONO#: 0.1 10*3/uL (ref 0.1–0.9)
MONO%: 3.6 % (ref 0.0–14.0)
MONO%: 4.3 % (ref 0.0–14.0)
NEUT%: 73.9 % (ref 38.4–76.8)
NEUT%: 71.4 % (ref 38.4–76.8)
NEUTROS ABS: 1.6 10*3/uL (ref 1.5–6.5)
NEUTROS ABS: 1.5 10*3/uL (ref 1.5–6.5)
PLATELETS: 157 10*3/uL (ref 145–400)
PLATELETS: 162 10*3/uL (ref 145–400)
RBC: 2.99 10*6/uL — AB (ref 3.70–5.45)
RBC: 2.85 10*6/uL — ABNORMAL LOW (ref 3.70–5.45)
RDW: 15.7 % — AB (ref 11.2–14.5)
RDW: 15.7 % — ABNORMAL HIGH (ref 11.2–14.5)
WBC: 2.2 10*3/uL — AB (ref 3.9–10.3)
WBC: 2.1 10*3/uL — AB (ref 3.9–10.3)
nRBC: 0 % (ref 0–0)

## 2013-07-13 LAB — COMPREHENSIVE METABOLIC PANEL (CC13)
ALT: 22 U/L (ref 0–55)
AST: 14 U/L (ref 5–34)
Albumin: 3.3 g/dL — ABNORMAL LOW (ref 3.5–5.0)
Alkaline Phosphatase: 96 U/L (ref 40–150)
Anion Gap: 10 mEq/L (ref 3–11)
BUN: 16.8 mg/dL (ref 7.0–26.0)
CALCIUM: 9.3 mg/dL (ref 8.4–10.4)
CHLORIDE: 107 meq/L (ref 98–109)
CO2: 27 meq/L (ref 22–29)
CREATININE: 1 mg/dL (ref 0.6–1.1)
Glucose: 103 mg/dl (ref 70–140)
POTASSIUM: 4.1 meq/L (ref 3.5–5.1)
Sodium: 144 mEq/L (ref 136–145)
Total Bilirubin: 0.66 mg/dL (ref 0.20–1.20)
Total Protein: 6.1 g/dL — ABNORMAL LOW (ref 6.4–8.3)

## 2013-07-13 MED ORDER — SODIUM CHLORIDE 0.9 % IJ SOLN
10.0000 mL | INTRAMUSCULAR | Status: AC | PRN
  Administered 2013-07-13: 10 mL
  Filled 2013-07-13: qty 10

## 2013-07-13 MED ORDER — HEPARIN SOD (PORK) LOCK FLUSH 100 UNIT/ML IV SOLN
500.0000 [IU] | INTRAVENOUS | Status: AC | PRN
  Administered 2013-07-13: 500 [IU]
  Filled 2013-07-13: qty 5

## 2013-07-13 MED ORDER — FAMOTIDINE IN NACL 20-0.9 MG/50ML-% IV SOLN
INTRAVENOUS | Status: AC
  Filled 2013-07-13: qty 50

## 2013-07-13 MED ORDER — DIPHENHYDRAMINE HCL 50 MG/ML IJ SOLN
INTRAMUSCULAR | Status: AC
  Filled 2013-07-13: qty 1

## 2013-07-13 MED ORDER — SODIUM CHLORIDE 0.9 % IJ SOLN
10.0000 mL | INTRAMUSCULAR | Status: DC | PRN
  Administered 2013-07-13: 10 mL
  Filled 2013-07-13: qty 10

## 2013-07-13 MED ORDER — DIPHENHYDRAMINE HCL 50 MG/ML IJ SOLN
25.0000 mg | Freq: Once | INTRAMUSCULAR | Status: AC
  Administered 2013-07-13: 25 mg via INTRAVENOUS

## 2013-07-13 MED ORDER — DEXAMETHASONE 4 MG PO TABS: ORAL_TABLET | ORAL | Status: DC

## 2013-07-13 MED ORDER — ONDANSETRON 16 MG/50ML IVPB (CHCC)
16.0000 mg | Freq: Once | INTRAVENOUS | Status: AC
  Administered 2013-07-13: 16 mg via INTRAVENOUS

## 2013-07-13 MED ORDER — SODIUM CHLORIDE 0.9 % IV SOLN
Freq: Once | INTRAVENOUS | Status: AC
  Administered 2013-07-13: 14:00:00 via INTRAVENOUS

## 2013-07-13 MED ORDER — FAMOTIDINE IN NACL 20-0.9 MG/50ML-% IV SOLN
20.0000 mg | Freq: Once | INTRAVENOUS | Status: AC
  Administered 2013-07-13 (×2): 20 mg via INTRAVENOUS

## 2013-07-13 MED ORDER — ONDANSETRON 16 MG/50ML IVPB (CHCC)
INTRAVENOUS | Status: AC
  Filled 2013-07-13: qty 16

## 2013-07-13 MED ORDER — DEXAMETHASONE SODIUM PHOSPHATE 20 MG/5ML IJ SOLN
20.0000 mg | Freq: Once | INTRAMUSCULAR | Status: AC
  Administered 2013-07-13: 20 mg via INTRAVENOUS

## 2013-07-13 MED ORDER — DEXAMETHASONE SODIUM PHOSPHATE 20 MG/5ML IJ SOLN
INTRAMUSCULAR | Status: AC
  Filled 2013-07-13: qty 5

## 2013-07-13 MED ORDER — HEPARIN SOD (PORK) LOCK FLUSH 100 UNIT/ML IV SOLN
500.0000 [IU] | Freq: Once | INTRAVENOUS | Status: AC | PRN
  Administered 2013-07-13: 500 [IU]
  Filled 2013-07-13: qty 5

## 2013-07-13 MED ORDER — PACLITAXEL CHEMO INJECTION 300 MG/50ML
80.0000 mg/m2 | Freq: Once | INTRAVENOUS | Status: AC
  Administered 2013-07-13: 168 mg via INTRAVENOUS
  Filled 2013-07-13: qty 28

## 2013-07-13 MED ORDER — SODIUM CHLORIDE 0.9 % IV SOLN
243.0000 mg | Freq: Once | INTRAVENOUS | Status: AC
  Administered 2013-07-13: 240 mg via INTRAVENOUS
  Filled 2013-07-13: qty 24

## 2013-07-13 NOTE — Telephone Encounter (Signed)
Per staff message and POF I have scheduled appts.  JMW  

## 2013-07-13 NOTE — Progress Notes (Signed)
[place]  [phone] [phone]     ID: Sydney Ford OB: 1955/06/23  MR#: [id number]  BOF#:[id number]  PCP: Dwan Bolt, MD GYN:   SU: Rolm Bookbinder, MD OTHER MD: Ulyess Blossom, MD;  Franchot Gallo, MD  CHIEF COMPLAINT:  Right breast cancer/neoadjuvant chemotherapy   HISTORY OF PRESENT ILLNESS: Sydney Ford (pronounced "line 'em") herself noted a change in her right breast sometime October 2014 but "my breasts are like bean bags" and she initially ignored it. After while she began to feel a pole when she lay on her right side at night so she scheduled herself for screening mammography [facility] 02/08/2013 (prior mammogram had been April 2011), and this did show a possible mass in the right breast. Unilateral right diagnostic mammography and ultrasonography 02/28/2013 confirmed an irregular mass in the right breast upper outer quadrant measuring 2.4 cm. This was palpable at the 11:00 position. Ultrasound showed an irregular hypoechoic mass measuring 1.5 cm with no abnormal adenopathy in the right axilla.  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  The patient met with Dr. Donne Hazel 03/14/2013 and he recommended primary systemic chemotherapy to decrease the size of the tumor and increase the chance of lumpectomy.   Subsequent history is as detailed below.  INTERVAL HISTORY: Sydney Ford returns alone today for followup of her locally advanced right breast cancer. She is due for her 5th of 12 planned weekly doses of carboplatin/paclitaxel today.   Sydney Ford's biggest complaint for the last several weeks has been recurrent kidney stones. She underwent stone extraction and stent placement on 06/30/2013 and returned last week to resume  chemotherapy. Since that time, however, she tells me she thinks she is "trying to pass another stone". She continues to have pressure in her right lower back and her bladder. She continues to be followed by Dr. Diona Fanti.    We discussed holding treatment today, but Sydney Ford tells me she feels well enough to be treated, and would really prefer not to delay chemotherapy. She continues to have some nausea, but thinks this may be associated as much with the pain as it does with the chemotherapy. She takes her antinausea medications appropriately and affectively.  REVIEW OF SYSTEMS: Sydney Ford has had no fevers or chills. She denies any skin changes or nail bed changes. She's had no signs of abnormal bruising or bleeding, and specifically she denies any recent hematuria. Her appetite is fair. She's had no change in bowel habits.  and She denies any increased cough or shortness of breath and has had no chest pain or palpitations. She's had no abnormal headaches or dizziness. She denies any unusual myalgias, arthralgias, bony pain, peripheral swelling, or peripheral neuropathy.  A detailed review of systems is otherwise stable.   PAST MEDICAL HISTORY: Past Medical History  Diagnosis Date  . Hypertension   . Hyperlipidemia   . Thyroid disease   . Osteoporosis   . Hypothyroidism     hashimoto's  . Depression   . Pneumonia     2014  . Cold (disease) 03/21/12    couple of weeks ago - pt states much better now  . History of kidney stones     "passed 9 stones"  . GERD (gastroesophageal reflux disease)     no meds except occas tums  . Headache(784.0)  occas migraine  . Cancer     new dx of right breast cancer - planning chemo first  . Arthritis     oa; lower back pain - hx of injury yrs ago-occas flare ups  . Anemia     2005 - related to heavy menses with the thyroid probl  . Chronic kidney disease     kidney stone    PAST SURGICAL HISTORY:  (Updated 07/07/2013) Past Surgical History  Procedure  Laterality Date  . Cesarean section  1987  . Tonsillectomy    . Uterine ablation  2008    no menstrual periods since procedure  . Portacath placement N/A 03/23/2013    Procedure: INSERTION PORT-A-CATH;  Surgeon: Rolm Bookbinder, MD;  Location: [facility];  Service: General;  Laterality: N/A;  . Cystoscopy with retrograde pyelogram, ureteroscopy and stent placement Right 06/30/2013    Procedure: CYSTOSCOPY WITH RETROGRADE PYELOGRAM, URETEROSCOPY, STONE EXTRACTION AND  STENT PLACEMENT;  Surgeon: Franchot Gallo, MD;  Location: [facility];  Service: Urology;  Laterality: Right;  . Holmium laser application Right 08/01/15    Procedure: HOLMIUM LASER APPLICATION;  Surgeon: Franchot Gallo, MD;  Location: [facility];  Service: Urology;  Laterality: Right;    FAMILY HISTORY Family History  Problem Relation Age of Onset  . Heart failure Mother   . Heart attack Father   . Autoimmune disease Maternal Aunt     lupus, scelraderma, reynolds disease, and others  . Stroke Paternal Uncle   . Heart failure Maternal Grandmother   . Heart attack Maternal Grandfather   . Throat cancer Paternal Grandmother     dx late 37s; non smoker or drinker  . Heart attack Paternal Grandfather   . Schizophrenia Son 15  . Heart attack Paternal Uncle    the patient's father died at the age of 23 from a myocardial infarction. The patient's mother died at the age of 56 with congestive heart failure. She also had a history of lupus. The patient had no brothers or sisters. There is no history of breast or ovarian cancer in the family to her knowledge.  GYNECOLOGIC HISTORY:   (reviewed on 07/13/2013)  Menarche age 36, first live birth age 36. She is GX P2. She underwent endometrial ablation in 2009 and has not had a period since that time. She never took hormone replacement. She did use birth control pills remotely with no complications  SOCIAL HISTORY: (Updated 07/13/2013) Sydney Ford works at [employer] for a variety of long-term  clients. She does not work for a company. Her husband [name] is semiretired. He works for a company that [employer] data on [place]. Son [name] is in [place] and works in Architect. Son [name] lives at home and is disabled with a diagnosis of paranoid schizophrenia. The patient has no grandchildren. She is not a church attender    ADVANCED DIRECTIVES: Not in place   HEALTH MAINTENANCE: (Updated 07/13/2013) History  Substance Use Topics  . Smoking status: Former Smoker    Quit date: 03/17/1985  . Smokeless tobacco: Never Used  . Alcohol Use: No     Colonoscopy: Never  PAP: Remote  Bone density: November 2014, showed osteoporosis   according to the patient's report  Lipid panel:  Not on file, Dr. Wilson Singer   Allergies  Allergen Reactions  . Codeine Itching and Nausea Only    REACTION: Nausea and itching  . Compazine [Prochlorperazine Edisylate] Other (See Comments)    Head aches   . Other  Close contact with Some recycled plastics cause whelps  . Tape Rash    Needs OPSITE dressing with port access    Current Outpatient Prescriptions  Medication Sig Dispense Refill  . atorvastatin (LIPITOR) 20 MG tablet Take 20 mg by mouth every evening.       . Cholecalciferol (VITAMIN D) 2000 UNITS CAPS Take 6,000 Units by mouth every morning.      Marland Kitchen dexamethasone (DECADRON) 4 MG tablet TAKE 2 TABLETS BY MOUTH ONCE A DAY ON THE DAY AFTER CHEMOTHERAPY AND THEN TAKE 2 TABLETS TWICE DAILY FOR 2 DAYS WITH FOOD.  30 tablet  0  . lidocaine-prilocaine (EMLA) cream Apply 1 application topically daily as needed. port      . LORazepam (ATIVAN) 0.5 MG tablet Take 0.5 mg by mouth every 8 (eight) hours as needed (nausea).       . metoCLOPramide (REGLAN) 5 MG tablet Take 1 tablet (5 mg total) by mouth 3 (three) times daily before meals.  90 tablet  4  . nitrofurantoin, macrocrystal-monohydrate, (MACROBID) 100 MG capsule       . omeprazole (PRILOSEC) 40 MG capsule Take 1 capsule  (40 mg total) by mouth daily.  30 capsule  4  . ondansetron (ZOFRAN) 8 MG tablet Take 1 tablet (8 mg total) by mouth every 8 (eight) hours as needed for nausea or vomiting.  20 tablet  0  . oxybutynin (DITROPAN) 5 MG tablet Take 1 tablet (5 mg total) by mouth 3 (three) times daily.  30 tablet  1  . PRESCRIPTION MEDICATION Chemotherapy at [facility]      . promethazine (PHENERGAN) 25 MG suppository Place 1 suppository (25 mg total) rectally every 8 (eight) hours as needed for nausea or vomiting.  12 each  0  . sertraline (ZOLOFT) 100 MG tablet TAKE ONE TABLET BY MOUTH AT BEDTIME  30 tablet  0  . tamsulosin (FLOMAX) 0.4 MG CAPS capsule       . thyroid (ARMOUR) 90 MG tablet Take 90 mg by mouth every morning.      . traMADol (ULTRAM) 50 MG tablet Take 1-2 tablets (50-100 mg total) by mouth 3 (three) times daily as needed for moderate pain or severe pain.  100 tablet  0  . traZODone (DESYREL) 50 MG tablet Take 1 tablet (50 mg total) by mouth at bedtime as needed for sleep.  30 tablet  1   No current facility-administered medications for this visit.    OBJECTIVE: Middle-aged white woman who appears tired  and slightly uncomfortable  Filed Vitals:   07/13/13 1124  BP: 165/81  Pulse: 99  Temp: 98.3 F (36.8 C)  Resp: 18     Body mass index is 40.1 kg/(m^2).    ECOG FS: 1 Filed Weights   07/13/13 1124  Weight: 219 lb 4.8 oz (99.474 kg)   Physical Exam: HEENT:  Sclerae anicteric.  Oropharynx  pink, and moist. No ulcerations, mucositis, or  candidiasis. Neck supple, trachea midline. NODES:  No cervical or supraclavicular lymphadenopathy palpated.  BREAST EXAM:   Unable to palpate a distinct mass in the right breast today.  Axillae are benign bilaterally with no palpable lymphadenopathy. LUNGS:  Clear to auscultation bilaterally with good excursion.  No Crackles, wheezes or rhonchi HEART:  Regular rate and rhythm. No murmur  ABDOMEN:  Soft, obese, nontender. No organomegaly or masses palpated.  Positive bowel sounds.  MSK:  No focal spinal tenderness to palpation. Good range of motion bilaterally in the upper extremities.  EXTREMITIES:  No peripheral edema.  No lymphedema noted in the right upper extremity. SKIN:  No visible rashes. No nail dyscrasia.  No excessive ecchymoses. No petechiae. Mild pallor. Good skin turgor. NEURO:  Nonfocal. Well oriented.   Fatigued  affect.     LAB RESULTS:  Lab Results  Component Value Date   WBC 2.1* 07/13/2013   NEUTROABS 1.5 07/13/2013   HGB 8.9* 07/13/2013   HCT 26.8* 07/13/2013   MCV 94.0 07/13/2013   PLT 162 07/13/2013      Chemistry      Component Value Date/Time   NA 144 07/13/2013 1204   NA 138 06/30/2013 0747   K 4.1 07/13/2013 1204   K 4.1 06/30/2013 0747   CL 101 06/30/2013 0747   CO2 27 07/13/2013 1204   CO2 25 06/30/2013 0747   BUN 16.8 07/13/2013 1204   BUN 23 06/30/2013 0747   CREATININE 1.0 07/13/2013 1204   CREATININE 1.22* 06/30/2013 0747      Component Value Date/Time   CALCIUM 9.3 07/13/2013 1204   CALCIUM 8.9 06/30/2013 0747   ALKPHOS 96 07/13/2013 1204   ALKPHOS 65 05/27/2013 0530   AST 14 07/13/2013 1204   AST 7 05/27/2013 0530   ALT 22 07/13/2013 1204   ALT 11 05/27/2013 0530   BILITOT 0.66 07/13/2013 1204   BILITOT 0.5 05/27/2013 0530       STUDIES:  CT Abdomen Pelvis Wo Contrast 06/15/2013  CLINICAL DATA: Abdominal pain, history of kidney stones and breast  cancer.  EXAM:  CT ABDOMEN AND PELVIS WITHOUT CONTRAST  TECHNIQUE:  Multidetector CT imaging of the abdomen and pelvis was performed  following the standard protocol without intravenous contrast.  COMPARISON: US RENAL dated 05/30/2013  FINDINGS:  Limited view of the lung bases are clear. Included heart and  pericardium are unremarkable.  Moderate right hydroureteronephrosis to the level of the mid ureter  where a 7 x 6 mm calculus is seen. Multiple bilateral  nephrolithiasis, including 6 mm right interpolar calculus,  contiguous calculi in the right  lower pole measuring 6 mm. 6 mm left  lower pole renal calculus. No left hydronephrosis.  The liver, pancreas, gallbladder and adrenal glands are  unremarkable. Coarse calcification in the spleen which is otherwise  unremarkable.  Stomach, small and large bowel are normal in course and caliber  without inflammatory changes. Normal appendix. No intraperitoneal  free fluid or free air. Aortoiliac vessels are normal course and  caliber. Internal reproductive organs are unremarkable. Urinary  bladder is decompressed harboring no intravesicular calculi.  Phleboliths in the pelvis.  Moderate degenerative changes of pubic symphysis. Grade 1 L5-S1  anterolisthesis on degenerative basis with severe L4-5 degenerative  disc disease. Small fat containing umbilical hernia.  IMPRESSION:  Moderate right hydroureteronephrosis to the level of the mid ureter  where a 7 x 6 mm calculus is seen. Bilateral residual  nephrolithiasis as described above. No left hydronephrosis.  Electronically Signed  By: Awilda Metro  On: 06/15/2013 05:22    ASSESSMENT: 58 y.o. 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  (2)  being treated in the neoadjuvant setting, the plan being to complete 4 dose dense cycles of doxorubicin/cyclophosphamide, first dose on 03/31/2013 and final dose on 05/19/2013. Cycle 4 was delayed one week because of bad weather.  Neulasta on day 2  for granulocyte support.   (3)  doxorubicin/cyclophosphamide is being followed by 12 weekly doses of carboplatin/paclitaxel prior to definitive surgery, first dose 06/09/2013.  (4)  recurrent nephrolithiasis - followed by Dr. Diona Fanti  PLAN:  Sydney Ford will proceed to treatment today as scheduled for her fifth weekly dose of carboplatin/paclitaxel. We will continue to follow her very closely on  a weekly basis with both labs and physical exam. Of course she will continue to be followed very closely by her urologist as well.   Our plan is to complete a total of 12 weekly doses prior to definitive surgery. This plan has been reviewed with Sydney Ford and she voices her understanding and agreement. She does know to call with any changes or problems.  Theotis Burrow, PA-C    07/07/2013

## 2013-07-13 NOTE — Telephone Encounter (Signed)
, °

## 2013-07-21 ENCOUNTER — Other Ambulatory Visit (HOSPITAL_BASED_OUTPATIENT_CLINIC_OR_DEPARTMENT_OTHER): Payer: BC Managed Care – PPO

## 2013-07-21 ENCOUNTER — Ambulatory Visit (HOSPITAL_BASED_OUTPATIENT_CLINIC_OR_DEPARTMENT_OTHER): Payer: BC Managed Care – PPO | Admitting: Oncology

## 2013-07-21 ENCOUNTER — Ambulatory Visit (HOSPITAL_BASED_OUTPATIENT_CLINIC_OR_DEPARTMENT_OTHER): Payer: BC Managed Care – PPO

## 2013-07-21 VITALS — BP 162/89 | HR 85 | Temp 98.2°F | Resp 20 | Ht 62.0 in | Wt 218.7 lb

## 2013-07-21 DIAGNOSIS — Z171 Estrogen receptor negative status [ER-]: Secondary | ICD-10-CM

## 2013-07-21 DIAGNOSIS — C50411 Malignant neoplasm of upper-outer quadrant of right female breast: Secondary | ICD-10-CM

## 2013-07-21 DIAGNOSIS — Z5111 Encounter for antineoplastic chemotherapy: Secondary | ICD-10-CM

## 2013-07-21 DIAGNOSIS — N200 Calculus of kidney: Secondary | ICD-10-CM

## 2013-07-21 DIAGNOSIS — C50419 Malignant neoplasm of upper-outer quadrant of unspecified female breast: Secondary | ICD-10-CM

## 2013-07-21 DIAGNOSIS — J45909 Unspecified asthma, uncomplicated: Secondary | ICD-10-CM

## 2013-07-21 DIAGNOSIS — M1990 Unspecified osteoarthritis, unspecified site: Secondary | ICD-10-CM

## 2013-07-21 DIAGNOSIS — R110 Nausea: Secondary | ICD-10-CM

## 2013-07-21 DIAGNOSIS — I10 Essential (primary) hypertension: Secondary | ICD-10-CM

## 2013-07-21 DIAGNOSIS — R5383 Other fatigue: Secondary | ICD-10-CM

## 2013-07-21 DIAGNOSIS — R5381 Other malaise: Secondary | ICD-10-CM

## 2013-07-21 LAB — CBC WITH DIFFERENTIAL/PLATELET
BASO%: 0 % (ref 0.0–2.0)
Basophils Absolute: 0 10*3/uL (ref 0.0–0.1)
EOS%: 0 % (ref 0.0–7.0)
Eosinophils Absolute: 0 10*3/uL (ref 0.0–0.5)
HEMATOCRIT: 28.4 % — AB (ref 34.8–46.6)
HGB: 9.2 g/dL — ABNORMAL LOW (ref 11.6–15.9)
LYMPH#: 0.4 10*3/uL — AB (ref 0.9–3.3)
LYMPH%: 20.5 % (ref 14.0–49.7)
MCH: 31.2 pg (ref 25.1–34.0)
MCHC: 32.4 g/dL (ref 31.5–36.0)
MCV: 96.3 fL (ref 79.5–101.0)
MONO#: 0.3 10*3/uL (ref 0.1–0.9)
MONO%: 16 % — ABNORMAL HIGH (ref 0.0–14.0)
NEUT#: 1.3 10*3/uL — ABNORMAL LOW (ref 1.5–6.5)
NEUT%: 63.5 % (ref 38.4–76.8)
NRBC: 0 % (ref 0–0)
Platelets: 232 10*3/uL (ref 145–400)
RBC: 2.95 10*6/uL — AB (ref 3.70–5.45)
RDW: 16.4 % — ABNORMAL HIGH (ref 11.2–14.5)
WBC: 2 10*3/uL — ABNORMAL LOW (ref 3.9–10.3)

## 2013-07-21 LAB — COMPREHENSIVE METABOLIC PANEL (CC13)
ALBUMIN: 3.2 g/dL — AB (ref 3.5–5.0)
ALT: 23 U/L (ref 0–55)
AST: 17 U/L (ref 5–34)
Alkaline Phosphatase: 84 U/L (ref 40–150)
Anion Gap: 12 mEq/L — ABNORMAL HIGH (ref 3–11)
BUN: 14.4 mg/dL (ref 7.0–26.0)
CALCIUM: 9.1 mg/dL (ref 8.4–10.4)
CHLORIDE: 108 meq/L (ref 98–109)
CO2: 22 mEq/L (ref 22–29)
Creatinine: 1.1 mg/dL (ref 0.6–1.1)
Glucose: 106 mg/dl (ref 70–140)
POTASSIUM: 3.7 meq/L (ref 3.5–5.1)
Sodium: 143 mEq/L (ref 136–145)
Total Bilirubin: 0.52 mg/dL (ref 0.20–1.20)
Total Protein: 6.1 g/dL — ABNORMAL LOW (ref 6.4–8.3)

## 2013-07-21 MED ORDER — FAMOTIDINE IN NACL 20-0.9 MG/50ML-% IV SOLN
INTRAVENOUS | Status: AC
  Filled 2013-07-21: qty 50

## 2013-07-21 MED ORDER — DIPHENHYDRAMINE HCL 50 MG/ML IJ SOLN
25.0000 mg | Freq: Once | INTRAMUSCULAR | Status: AC
  Administered 2013-07-21: 25 mg via INTRAVENOUS

## 2013-07-21 MED ORDER — DEXAMETHASONE SODIUM PHOSPHATE 20 MG/5ML IJ SOLN
20.0000 mg | Freq: Once | INTRAMUSCULAR | Status: AC
  Administered 2013-07-21: 20 mg via INTRAVENOUS

## 2013-07-21 MED ORDER — FAMOTIDINE IN NACL 20-0.9 MG/50ML-% IV SOLN
20.0000 mg | Freq: Once | INTRAVENOUS | Status: AC
  Administered 2013-07-21: 20 mg via INTRAVENOUS

## 2013-07-21 MED ORDER — SODIUM CHLORIDE 0.9 % IJ SOLN
10.0000 mL | INTRAMUSCULAR | Status: DC | PRN
  Administered 2013-07-21: 10 mL
  Filled 2013-07-21: qty 10

## 2013-07-21 MED ORDER — DIPHENHYDRAMINE HCL 50 MG/ML IJ SOLN
INTRAMUSCULAR | Status: AC
  Filled 2013-07-21: qty 1

## 2013-07-21 MED ORDER — DEXAMETHASONE SODIUM PHOSPHATE 20 MG/5ML IJ SOLN
INTRAMUSCULAR | Status: AC
  Filled 2013-07-21: qty 5

## 2013-07-21 MED ORDER — PACLITAXEL CHEMO INJECTION 300 MG/50ML
80.0000 mg/m2 | Freq: Once | INTRAVENOUS | Status: AC
  Administered 2013-07-21: 168 mg via INTRAVENOUS
  Filled 2013-07-21: qty 28

## 2013-07-21 MED ORDER — HEPARIN SOD (PORK) LOCK FLUSH 100 UNIT/ML IV SOLN
500.0000 [IU] | Freq: Once | INTRAVENOUS | Status: AC | PRN
  Administered 2013-07-21: 500 [IU]
  Filled 2013-07-21: qty 5

## 2013-07-21 MED ORDER — ONDANSETRON 16 MG/50ML IVPB (CHCC)
16.0000 mg | Freq: Once | INTRAVENOUS | Status: AC
  Administered 2013-07-21: 16 mg via INTRAVENOUS

## 2013-07-21 MED ORDER — SODIUM CHLORIDE 0.9 % IV SOLN
Freq: Once | INTRAVENOUS | Status: AC
  Administered 2013-07-21: 09:00:00 via INTRAVENOUS

## 2013-07-21 MED ORDER — SODIUM CHLORIDE 0.9 % IV SOLN
243.0000 mg | Freq: Once | INTRAVENOUS | Status: AC
  Administered 2013-07-21: 240 mg via INTRAVENOUS
  Filled 2013-07-21: qty 24

## 2013-07-21 MED ORDER — ONDANSETRON 16 MG/50ML IVPB (CHCC)
INTRAVENOUS | Status: AC
  Filled 2013-07-21: qty 16

## 2013-07-21 NOTE — Progress Notes (Signed)
[place]  [phone] [phone]     ID: Sydney Ford OB: 1955-07-05  MR#: [id number]  ZJI#:[id number]  PCP: Dwan Bolt, MD GYN:   SU: Rolm Bookbinder, MD OTHER MD: Ulyess Blossom, MD;  Franchot Gallo, MD  CHIEF COMPLAINT:  Right breast cancer/neoadjuvant chemotherapy   BREAST CANCER HISTORY: Sydney Ford (pronounced "line 'em") herself noted a change in her right breast sometime October 2014 but "my breasts are like bean bags" and she initially ignored it. After while she began to feel a pole when she lay on her right side at night so she scheduled herself for screening mammography at the [facility] Center 02/08/2013 (prior mammogram had been April 2011), and this did show a possible mass in the right breast. Unilateral right diagnostic mammography and ultrasonography 02/28/2013 confirmed an irregular mass in the right breast upper outer quadrant measuring 2.4 cm. This was palpable at the 11:00 position. Ultrasound showed an irregular hypoechoic mass measuring 1.5 cm with no abnormal adenopathy in the right axilla.  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  The patient met with Dr. Donne Hazel 03/14/2013 and he recommended primary systemic chemotherapy to decrease the size of the tumor and increase the chance of lumpectomy.   Subsequent history is as detailed below.  INTERVAL HISTORY: Sydney Ford returns today for followup of her locally advanced right breast cancer. She is due for her 6th of 12 planned weekly doses of carboplatin/ paclitaxel today.   REVIEW OF SYSTEMS: Sydney Ford is having some problems with nausea. It is not clear exactly what is causing this. It isn't chemotherapy-related, because it is constant. It is not clear to me whether or not  she is taking her Reglan before meals, but we went over that in detail today again. She still has problems with the kidney stones and has not passed "the large stone". She does have an appointment with Dr. Diona Fanti 07/29/2013. It could be that we could give her some extra fluid while she is getting treated. The most important thing to note that with this she is having absolutely no peripheral neuropathy. A detailed review of systems today was otherwise noncontributory   PAST MEDICAL HISTORY: Past Medical History  Diagnosis Date  . Hypertension   . Hyperlipidemia   . Thyroid disease   . Osteoporosis   . Hypothyroidism     hashimoto's  . Depression   . Pneumonia     2014  . Cold (disease) 03/21/12    couple of weeks ago - pt states much better now  . History of kidney stones     "passed 9 stones"  . GERD (gastroesophageal reflux disease)     no meds except occas tums  . Headache(784.0)     occas migraine  . Cancer     new dx of right breast cancer - planning chemo first  . Arthritis     oa; lower back pain - hx of injury yrs ago-occas flare ups  . Anemia     2005 - related to heavy menses with the thyroid probl  . Chronic kidney disease     kidney stone    PAST SURGICAL HISTORY:  (Updated 07/07/2013) Past Surgical History  Procedure Laterality Date  . Cesarean section  1987  . Tonsillectomy    . Uterine ablation  2008  no menstrual periods since procedure  . Portacath placement N/A 03/23/2013    Procedure: INSERTION PORT-A-CATH;  Surgeon: Rolm Bookbinder, MD;  Location: [facility];  Service: General;  Laterality: N/A;  . Cystoscopy with retrograde pyelogram, ureteroscopy and stent placement Right 06/30/2013    Procedure: CYSTOSCOPY WITH RETROGRADE PYELOGRAM, URETEROSCOPY, STONE EXTRACTION AND  STENT PLACEMENT;  Surgeon: Franchot Gallo, MD;  Location: [facility];  Service: Urology;  Laterality: Right;  . Holmium laser application Right 5/36/1443    Procedure: HOLMIUM LASER  APPLICATION;  Surgeon: Franchot Gallo, MD;  Location: [facility];  Service: Urology;  Laterality: Right;    FAMILY HISTORY Family History  Problem Relation Age of Onset  . Heart failure Mother   . Heart attack Father   . Autoimmune disease Maternal Aunt     lupus, scelraderma, reynolds disease, and others  . Stroke Paternal Uncle   . Heart failure Maternal Grandmother   . Heart attack Maternal Grandfather   . Throat cancer Paternal Grandmother     dx late 60; non smoker or drinker  . Heart attack Paternal Grandfather   . Schizophrenia Son 12  . Heart attack Paternal Uncle    the patient's father died at the age of 56 from a myocardial infarction. The patient's mother died at the age of 65 with congestive heart failure. She also had a history of lupus. The patient had no brothers or sisters. There is no history of breast or ovarian cancer in the family to her knowledge.  GYNECOLOGIC HISTORY:   (reviewed on 07/13/2013)  Menarche age 15, first live birth age 51. She is GX P2. She underwent endometrial ablation in 2009 and has not had a period since that time. She never took hormone replacement. She did use birth control pills remotely with no complications  SOCIAL HISTORY: (Updated 07/13/2013) Sydney Ford works at [employer] for a variety of long-term clients. She does not work for a company. Her husband Sydney Ford "Sydney Ford" Sydney Ford is semiretired. He works for a company that [employer] data on [employer]. Son Sydney Ford is in [place] and works in Architect. Son Sydney Ford lives at home and is disabled with a diagnosis of paranoid schizophrenia. The patient has no grandchildren. She is not a church attender    ADVANCED DIRECTIVES: Not in place   HEALTH MAINTENANCE: (Updated 07/13/2013) History  Substance Use Topics  . Smoking status: Former Smoker    Quit date: 03/17/1985  . Smokeless tobacco: Never Used  . Alcohol Use: No     Colonoscopy: Never  PAP: Remote  Bone density: November  2014, showed osteoporosis   according to the patient's report  Lipid panel:  Not on file, Dr. Wilson Singer   Allergies  Allergen Reactions  . Codeine Itching and Nausea Only    REACTION: Nausea and itching  . Compazine [Prochlorperazine Edisylate] Other (See Comments)    Head aches   . Other     Close contact with Some recycled plastics cause whelps  . Tape Rash    Needs OPSITE dressing with port access    Current Outpatient Prescriptions  Medication Sig Dispense Refill  . atorvastatin (LIPITOR) 20 MG tablet Take 20 mg by mouth every evening.       . Cholecalciferol (VITAMIN D) 2000 UNITS CAPS Take 6,000 Units by mouth every morning.      Marland Kitchen dexamethasone (DECADRON) 4 MG tablet TAKE 2 TABLETS BY MOUTH ONCE A DAY ON THE DAY AFTER CHEMOTHERAPY AND THEN TAKE 2 TABLETS TWICE DAILY FOR  2 DAYS WITH FOOD.  60 tablet  0  . lidocaine-prilocaine (EMLA) cream Apply 1 application topically daily as needed. port      . LORazepam (ATIVAN) 0.5 MG tablet Take 0.5 mg by mouth every 8 (eight) hours as needed (nausea).       . metoCLOPramide (REGLAN) 5 MG tablet Take 1 tablet (5 mg total) by mouth 3 (three) times daily before meals.  90 tablet  4  . nitrofurantoin, macrocrystal-monohydrate, (MACROBID) 100 MG capsule       . omeprazole (PRILOSEC) 40 MG capsule Take 1 capsule (40 mg total) by mouth daily.  30 capsule  4  . ondansetron (ZOFRAN) 8 MG tablet Take 1 tablet (8 mg total) by mouth every 8 (eight) hours as needed for nausea or vomiting.  20 tablet  0  . oxybutynin (DITROPAN) 5 MG tablet Take 1 tablet (5 mg total) by mouth 3 (three) times daily.  30 tablet  1  . PRESCRIPTION MEDICATION Chemotherapy at [facility]      . promethazine (PHENERGAN) 25 MG suppository Place 1 suppository (25 mg total) rectally every 8 (eight) hours as needed for nausea or vomiting.  12 each  0  . sertraline (ZOLOFT) 100 MG tablet TAKE ONE TABLET BY MOUTH AT BEDTIME  30 tablet  0  . tamsulosin (FLOMAX) 0.4 MG CAPS capsule       .  thyroid (ARMOUR) 90 MG tablet Take 90 mg by mouth every morning.      . traMADol (ULTRAM) 50 MG tablet Take 1-2 tablets (50-100 mg total) by mouth 3 (three) times daily as needed for moderate pain or severe pain.  100 tablet  0  . traZODone (DESYREL) 50 MG tablet Take 1 tablet (50 mg total) by mouth at bedtime as needed for sleep.  30 tablet  1   No current facility-administered medications for this visit.       Filed Vitals:   07/21/13 0837  BP: 162/89  Pulse: 85  Temp: 98.2 F (36.8 C)  Resp: 20     Body mass index is 39.99 kg/(m^2).    ECOG FS: 1 Filed Weights   07/21/13 0837  Weight: 218 lb 11.2 oz (99.202 kg)    OBJECTIVE:  Sclerae unicteric, pupils equal and reactive Oropharynx clear and moist-- teeth in good repair No cervical or supraclavicular adenopathy Lungs no rales or rhonchi Heart regular rate and rhythm Abd soft, obese, nontender, positive bowel sounds MSK no focal spinal tenderness, no upper extremity lymphedema Neuro: nonfocal, well oriented, appropriate affect Breasts: I do not palpate a mass in the right breast. There are no skin or nipple changes of concern. The right axilla is benign. Left breast is unremarkable.   LAB RESULTS:  Lab Results  Component Value Date   WBC 2.0* 07/21/2013   NEUTROABS 1.3* 07/21/2013   HGB 9.2* 07/21/2013   HCT 28.4* 07/21/2013   MCV 96.3 07/21/2013   PLT 232 07/21/2013      Chemistry      Component Value Date/Time   NA 143 07/21/2013 0821   NA 138 06/30/2013 0747   K 3.7 07/21/2013 0821   K 4.1 06/30/2013 0747   CL 101 06/30/2013 0747   CO2 22 07/21/2013 0821   CO2 25 06/30/2013 0747   BUN 14.4 07/21/2013 0821   BUN 23 06/30/2013 0747   CREATININE 1.1 07/21/2013 0821   CREATININE 1.22* 06/30/2013 0747      Component Value Date/Time   CALCIUM 9.1 07/21/2013 5784  CALCIUM 8.9 06/30/2013 0747   ALKPHOS 84 07/21/2013 0821   ALKPHOS 65 05/27/2013 0530   AST 17 07/21/2013 0821   AST 7 05/27/2013 0530   ALT 23 07/21/2013 0821   ALT 11  05/27/2013 0530   BILITOT 0.52 07/21/2013 0821   BILITOT 0.5 05/27/2013 0530       STUDIES: No results found.  ASSESSMENT: 58 y.o. 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  (2)  being treated in the neoadjuvant setting,   (a) completed 4 dose dense cycles of doxorubicin/cyclophosphamide 05/19/2013. Cycle 4 was delayed one week because of bad weather.  Neulasta on day 2 for granulocyte support.  (b) receiving 12 weekly doses of carboplatin/paclitaxel prior to definitive surgery, first dose 06/09/2013.  (3)  recurrent nephrolithiasis - followed by Dr. Diona Fanti  PLAN:  [name] is tolerating the chemotherapy well, and in particular has not developed any peripheral neuropathy symptoms. We are proceeding with the sixth of 12 planned weekly doses of paclitaxel.  Her neutrophils are drifting down. My suspicion is that she will have to skip a week possibly next week or the week after that. We would make it up at the end if necessary.  It would be great if she could drink more fluids, because he would help her pastor kidney stones. I am going to at 500 cc of saline to her treatment today to help a little bit with that.  She is excited about the fact that we no longer feel any palpable mass in the right breast. She has a good understanding of the overall plan. She agrees with it. She knows the goal of treatment in her case is cure. She will call with any problems that may develop before the next visit here.  Chauncey Cruel, MD    07/21/2013

## 2013-07-21 NOTE — Patient Instructions (Signed)
Cancer Center Discharge Instructions for Patients Receiving Chemotherapy  Today you received the following chemotherapy agents Taxol/Carboplatin To help prevent nausea and vomiting after your treatment, we encourage you to take your nausea medication as prescribed.  If you develop nausea and vomiting that is not controlled by your nausea medication, call the clinic.   BELOW ARE SYMPTOMS THAT SHOULD BE REPORTED IMMEDIATELY:  *FEVER GREATER THAN 100.5 F  *CHILLS WITH OR WITHOUT FEVER  NAUSEA AND VOMITING THAT IS NOT CONTROLLED WITH YOUR NAUSEA MEDICATION  *UNUSUAL SHORTNESS OF BREATH  *UNUSUAL BRUISING OR BLEEDING  TENDERNESS IN MOUTH AND THROAT WITH OR WITHOUT PRESENCE OF ULCERS  *URINARY PROBLEMS  *BOWEL PROBLEMS  UNUSUAL RASH Items with * indicate a potential emergency and should be followed up as soon as possible.  Feel free to call the clinic you have any questions or concerns. The clinic phone number is [phone].    

## 2013-07-21 NOTE — Addendum Note (Signed)
Addended by: Laureen Abrahams on: 07/21/2013 06:07 PM   Modules accepted: Orders

## 2013-07-28 ENCOUNTER — Ambulatory Visit (HOSPITAL_BASED_OUTPATIENT_CLINIC_OR_DEPARTMENT_OTHER): Payer: BC Managed Care – PPO | Admitting: Hematology and Oncology

## 2013-07-28 ENCOUNTER — Other Ambulatory Visit (HOSPITAL_BASED_OUTPATIENT_CLINIC_OR_DEPARTMENT_OTHER): Payer: BC Managed Care – PPO

## 2013-07-28 ENCOUNTER — Ambulatory Visit (HOSPITAL_BASED_OUTPATIENT_CLINIC_OR_DEPARTMENT_OTHER): Payer: BC Managed Care – PPO

## 2013-07-28 VITALS — BP 156/84 | HR 92 | Temp 97.9°F | Resp 20 | Ht 62.0 in | Wt 217.4 lb

## 2013-07-28 DIAGNOSIS — Z5111 Encounter for antineoplastic chemotherapy: Secondary | ICD-10-CM

## 2013-07-28 DIAGNOSIS — C50411 Malignant neoplasm of upper-outer quadrant of right female breast: Secondary | ICD-10-CM

## 2013-07-28 DIAGNOSIS — Z171 Estrogen receptor negative status [ER-]: Secondary | ICD-10-CM

## 2013-07-28 DIAGNOSIS — C50419 Malignant neoplasm of upper-outer quadrant of unspecified female breast: Secondary | ICD-10-CM

## 2013-07-28 LAB — CBC WITH DIFFERENTIAL/PLATELET
BASO%: 0.3 % (ref 0.0–2.0)
BASOS ABS: 0 10*3/uL (ref 0.0–0.1)
EOS%: 0 % (ref 0.0–7.0)
Eosinophils Absolute: 0 10*3/uL (ref 0.0–0.5)
HCT: 30.5 % — ABNORMAL LOW (ref 34.8–46.6)
HEMOGLOBIN: 10 g/dL — AB (ref 11.6–15.9)
LYMPH%: 18.5 % (ref 14.0–49.7)
MCH: 31.3 pg (ref 25.1–34.0)
MCHC: 32.8 g/dL (ref 31.5–36.0)
MCV: 95.6 fL (ref 79.5–101.0)
MONO#: 0.2 10*3/uL (ref 0.1–0.9)
MONO%: 6.3 % (ref 0.0–14.0)
NEUT#: 2.2 10*3/uL (ref 1.5–6.5)
NEUT%: 74.9 % (ref 38.4–76.8)
Platelets: 162 10*3/uL (ref 145–400)
RBC: 3.19 10*6/uL — ABNORMAL LOW (ref 3.70–5.45)
RDW: 16 % — AB (ref 11.2–14.5)
WBC: 2.9 10*3/uL — ABNORMAL LOW (ref 3.9–10.3)
lymph#: 0.5 10*3/uL — ABNORMAL LOW (ref 0.9–3.3)

## 2013-07-28 LAB — COMPREHENSIVE METABOLIC PANEL (CC13)
ALT: 26 U/L (ref 0–55)
AST: 14 U/L (ref 5–34)
Albumin: 3.5 g/dL (ref 3.5–5.0)
Alkaline Phosphatase: 81 U/L (ref 40–150)
Anion Gap: 11 mEq/L (ref 3–11)
BUN: 14.5 mg/dL (ref 7.0–26.0)
CALCIUM: 9.5 mg/dL (ref 8.4–10.4)
CHLORIDE: 106 meq/L (ref 98–109)
CO2: 26 mEq/L (ref 22–29)
CREATININE: 1.1 mg/dL (ref 0.6–1.1)
Glucose: 118 mg/dl (ref 70–140)
POTASSIUM: 3.7 meq/L (ref 3.5–5.1)
Sodium: 144 mEq/L (ref 136–145)
Total Bilirubin: 0.65 mg/dL (ref 0.20–1.20)
Total Protein: 6.4 g/dL (ref 6.4–8.3)

## 2013-07-28 MED ORDER — ONDANSETRON 16 MG/50ML IVPB (CHCC)
INTRAVENOUS | Status: AC
  Filled 2013-07-28: qty 16

## 2013-07-28 MED ORDER — DEXAMETHASONE SODIUM PHOSPHATE 20 MG/5ML IJ SOLN
20.0000 mg | Freq: Once | INTRAMUSCULAR | Status: AC
  Administered 2013-07-28: 20 mg via INTRAVENOUS

## 2013-07-28 MED ORDER — SODIUM CHLORIDE 0.9 % IV SOLN
Freq: Once | INTRAVENOUS | Status: AC
  Administered 2013-07-28: 12:00:00 via INTRAVENOUS

## 2013-07-28 MED ORDER — ONDANSETRON 16 MG/50ML IVPB (CHCC)
16.0000 mg | Freq: Once | INTRAVENOUS | Status: AC
  Administered 2013-07-28: 16 mg via INTRAVENOUS

## 2013-07-28 MED ORDER — DEXAMETHASONE SODIUM PHOSPHATE 20 MG/5ML IJ SOLN
INTRAMUSCULAR | Status: AC
  Filled 2013-07-28: qty 5

## 2013-07-28 MED ORDER — DIPHENHYDRAMINE HCL 50 MG/ML IJ SOLN
INTRAMUSCULAR | Status: AC
  Filled 2013-07-28: qty 1

## 2013-07-28 MED ORDER — PACLITAXEL CHEMO INJECTION 300 MG/50ML
80.0000 mg/m2 | Freq: Once | INTRAVENOUS | Status: AC
  Administered 2013-07-28: 168 mg via INTRAVENOUS
  Filled 2013-07-28: qty 28

## 2013-07-28 MED ORDER — HEPARIN SOD (PORK) LOCK FLUSH 100 UNIT/ML IV SOLN
500.0000 [IU] | Freq: Once | INTRAVENOUS | Status: AC | PRN
  Administered 2013-07-28: 500 [IU]
  Filled 2013-07-28: qty 5

## 2013-07-28 MED ORDER — DIPHENHYDRAMINE HCL 50 MG/ML IJ SOLN
25.0000 mg | Freq: Once | INTRAMUSCULAR | Status: AC
  Administered 2013-07-28: 25 mg via INTRAVENOUS

## 2013-07-28 MED ORDER — FAMOTIDINE IN NACL 20-0.9 MG/50ML-% IV SOLN
INTRAVENOUS | Status: AC
  Filled 2013-07-28: qty 50

## 2013-07-28 MED ORDER — SODIUM CHLORIDE 0.9 % IJ SOLN
10.0000 mL | INTRAMUSCULAR | Status: DC | PRN
  Administered 2013-07-28: 10 mL
  Filled 2013-07-28: qty 10

## 2013-07-28 MED ORDER — SODIUM CHLORIDE 0.9 % IV SOLN
243.0000 mg | Freq: Once | INTRAVENOUS | Status: AC
  Administered 2013-07-28: 240 mg via INTRAVENOUS
  Filled 2013-07-28: qty 24

## 2013-07-28 MED ORDER — FAMOTIDINE IN NACL 20-0.9 MG/50ML-% IV SOLN
20.0000 mg | Freq: Once | INTRAVENOUS | Status: AC
  Administered 2013-07-28: 20 mg via INTRAVENOUS

## 2013-07-28 NOTE — Patient Instructions (Signed)
Cancer Center Discharge Instructions for Patients Receiving Chemotherapy  Today you received the following chemotherapy agents Taxol/Carboplatin To help prevent nausea and vomiting after your treatment, we encourage you to take your nausea medication as prescribed.  If you develop nausea and vomiting that is not controlled by your nausea medication, call the clinic.   BELOW ARE SYMPTOMS THAT SHOULD BE REPORTED IMMEDIATELY:  *FEVER GREATER THAN 100.5 F  *CHILLS WITH OR WITHOUT FEVER  NAUSEA AND VOMITING THAT IS NOT CONTROLLED WITH YOUR NAUSEA MEDICATION  *UNUSUAL SHORTNESS OF BREATH  *UNUSUAL BRUISING OR BLEEDING  TENDERNESS IN MOUTH AND THROAT WITH OR WITHOUT PRESENCE OF ULCERS  *URINARY PROBLEMS  *BOWEL PROBLEMS  UNUSUAL RASH Items with * indicate a potential emergency and should be followed up as soon as possible.  Feel free to call the clinic you have any questions or concerns. The clinic phone number is [phone].    

## 2013-07-28 NOTE — Progress Notes (Signed)
[place]yd[place]  Telephone:([phone] [phone]) [phone]     ID: Sydney Ford OB: October 25, 1955  MR#: [id number]  TIW#:[id number]  PCP: Sydney Bolt, MD GYN:   SU: Sydney Bookbinder, MD OTHER MD: Sydney Blossom, MD;  Sydney Gallo, MD  CHIEF COMPLAINT:  Right breast cancer/neoadjuvant chemotherapy   BREAST CANCER HISTORY: As per previously documented note:  Sydney Ford (pronounced "line 'em") herself noted a change in her right breast sometime October 2014 but "my breasts are like bean bags" and she initially ignored it. After while she began to feel a pole when she lay on her right side at night so she scheduled herself for screening mammography at the breast Center 02/08/2013 (prior mammogram had been April 2011), and this did show a possible mass in the right breast. Unilateral right diagnostic mammography and ultrasonography 02/28/2013 confirmed an irregular mass in the right breast upper outer quadrant measuring 2.4 cm. This was palpable at the 11:00 position. Ultrasound showed an irregular hypoechoic mass measuring 1.5 cm with no abnormal adenopathy in the right axilla.  Biopsy of the mass was performed the same day, and showed ([id number]) an invasive ductal carcinoma, grade not stated, E-cadherin strongly positive, estrogen and progesterone receptor negative with no HER-2 amplification, and an MIB-1 of 99%.  The patient met with Dr. Donne Ford 03/14/2013 and he recommended primary systemic chemotherapy to decrease the size of the tumor and increase the chance of lumpectomy.   Subsequent history is as detailed below.  INTERVAL HISTORY: Sydney Ford returns today for followup of her locally advanced right breast cancer. She is due for her 7th cycle of  12 planned weekly doses of carboplatin/ paclitaxel today. She does complain of sweating and fatigue  after the chemotherapy and she is taking Decadron for 4 days with each chemotherapy . Her nausea is controlled with  the medications  REVIEW OF SYSTEMS: She denies any headaches, blurred vision, constipation blood in the stool or blood in the urine. And she does not complain of any neuropathy. She denies any fever, chest pain, shortness of breath, palpitations. Rest of the pertinent symptoms are mentioned in interval history  PAST MEDICAL HISTORY: Past Medical History  Diagnosis Date  . Hypertension   . Hyperlipidemia   . Thyroid disease   . Osteoporosis   . Hypothyroidism     hashimoto's  . Depression   . Pneumonia     2014  . Cold (disease) 03/21/12    couple of weeks ago - pt states much better now  . History of kidney stones     "passed 9 stones"  . GERD (gastroesophageal reflux disease)     no meds except occas tums  . Headache(784.0)     occas migraine  . Cancer     new dx of right breast cancer - planning chemo first  . Arthritis     oa; lower back pain - hx of injury yrs ago-occas flare ups  . Anemia     2005 - related to heavy menses with the thyroid probl  . Chronic kidney disease     kidney stone    PAST SURGICAL HISTORY:  (Updated 07/07/2013) Past Surgical History  Procedure Laterality Date  . Cesarean section  1987  . Tonsillectomy    . Uterine ablation  2008    no menstrual periods since procedure  . Portacath placement N/A 03/23/2013    Procedure: INSERTION PORT-A-CATH;  Surgeon: Sydney Bookbinder, MD;  Location: [facility];  Service: General;  Laterality:  N/A;  . Cystoscopy with retrograde pyelogram, ureteroscopy and stent placement Right 06/30/2013    Procedure: CYSTOSCOPY WITH RETROGRADE PYELOGRAM, URETEROSCOPY, STONE EXTRACTION AND  STENT PLACEMENT;  Surgeon: Sydney Gallo, MD;  Location: [facility];  Service: Urology;  Laterality: Right;  . Holmium laser application Right 7/67/3419    Procedure: HOLMIUM LASER APPLICATION;  Surgeon: Sydney Gallo, MD;  Location: [facility];  Service: Urology;  Laterality: Right;    FAMILY HISTORY Family History  Problem Relation Age of  Onset  . Heart failure Mother   . Heart attack Father   . Autoimmune disease Maternal Aunt     lupus, scelraderma, reynolds disease, and others  . Stroke Paternal Uncle   . Heart failure Maternal Grandmother   . Heart attack Maternal Grandfather   . Throat cancer Paternal Grandmother     dx late 66s; non smoker or drinker  . Heart attack Paternal Grandfather   . Schizophrenia Son 40  . Heart attack Paternal Uncle    the patient's father died at the age of 75 from a myocardial infarction. The patient's mother died at the age of 105 with congestive heart failure. She also had a history of lupus. The patient had no brothers or sisters. There is no history of breast or ovarian cancer in the family to her knowledge.  GYNECOLOGIC HISTORY:   (reviewed on 07/13/2013)  Menarche age 47, first live birth age 65. She is GX P2. She underwent endometrial ablation in 2009 and has not had a period since that time. She never took hormone replacement. She did use birth control pills remotely with no complications  SOCIAL HISTORY: (Updated 07/13/2013) Sydney Ford works at [employer] for a variety of long-term clients. She does not work for a company. Her husband Sydney Ford "Clair Gulling" Satira Anis is semiretired. He works for a company that [employer] data on [place]. Son Sydney Ford is in [place] and works in Architect. Son Sydney Ford lives at home and is disabled with a diagnosis of paranoid schizophrenia. The patient has no grandchildren. She is not a church attender    ADVANCED DIRECTIVES: Not in place   HEALTH MAINTENANCE: (Updated 07/13/2013) History  Substance Use Topics  . Smoking status: Former Smoker    Quit date: 03/17/1985  . Smokeless tobacco: Never Used  . Alcohol Use: No     Colonoscopy: Never  PAP: Remote  Bone density: November 2014, showed osteoporosis   according to the patient's report  Lipid panel:  Not on file, Dr. Wilson Singer   Allergies  Allergen Reactions  . Codeine Itching and Nausea  Only    REACTION: Nausea and itching  . Compazine [Prochlorperazine Edisylate] Other (See Comments)    Head aches   . Other     Close contact with Some recycled plastics cause whelps  . Tape Rash    Needs OPSITE dressing with port access    Current Outpatient Prescriptions  Medication Sig Dispense Refill  . atorvastatin (LIPITOR) 20 MG tablet Take 20 mg by mouth every evening.       Marland Kitchen dexamethasone (DECADRON) 4 MG tablet TAKE 2 TABLETS BY MOUTH ONCE A DAY ON THE DAY AFTER CHEMOTHERAPY AND THEN TAKE 2 TABLETS TWICE DAILY FOR 2 DAYS WITH FOOD.  60 tablet  0  . lidocaine-prilocaine (EMLA) cream Apply 1 application topically daily as needed. port      . LORazepam (ATIVAN) 0.5 MG tablet Take 0.5 mg by mouth every 8 (eight) hours as needed (nausea).       Marland Kitchen  metoCLOPramide (REGLAN) 5 MG tablet Take 1 tablet (5 mg total) by mouth 3 (three) times daily before meals.  90 tablet  4  . nitrofurantoin, macrocrystal-monohydrate, (MACROBID) 100 MG capsule       . omeprazole (PRILOSEC) 40 MG capsule Take 1 capsule (40 mg total) by mouth daily.  30 capsule  4  . ondansetron (ZOFRAN) 8 MG tablet Take 1 tablet (8 mg total) by mouth every 8 (eight) hours as needed for nausea or vomiting.  20 tablet  0  . oxybutynin (DITROPAN) 5 MG tablet Take 1 tablet (5 mg total) by mouth 3 (three) times daily.  30 tablet  1  . PRESCRIPTION MEDICATION Chemotherapy at [facility]      . promethazine (PHENERGAN) 25 MG suppository Place 1 suppository (25 mg total) rectally every 8 (eight) hours as needed for nausea or vomiting.  12 each  0  . sertraline (ZOLOFT) 100 MG tablet TAKE ONE TABLET BY MOUTH AT BEDTIME  30 tablet  0  . tamsulosin (FLOMAX) 0.4 MG CAPS capsule       . thyroid (ARMOUR) 90 MG tablet Take 90 mg by mouth every morning.      . traMADol (ULTRAM) 50 MG tablet Take 1-2 tablets (50-100 mg total) by mouth 3 (three) times daily as needed for moderate pain or severe pain.  100 tablet  0  . traZODone (DESYREL) 50 MG tablet  Take 1 tablet (50 mg total) by mouth at bedtime as needed for sleep.  30 tablet  1   No current facility-administered medications for this visit.       Filed Vitals:   07/28/13 1110  BP: 156/84  Pulse: 92  Temp: 97.9 F (36.6 C)  Resp: 20     Body mass index is 39.75 kg/(m^2).    ECOG FS: 1 Filed Weights   07/28/13 1110  Weight: 217 lb 6.4 oz (98.612 kg)    OBJECTIVE:  Sclerae unicteric, pupils equal and reactive Oropharynx clear and moist-- teeth in good repair No cervical or supraclavicular adenopathy Lungs no rales or rhonchi Heart regular rate and rhythm Abd soft, obese, nontender, positive bowel sounds MSK no focal spinal tenderness, no upper extremity lymphedema Neuro: nonfocal, well oriented, appropriate affect Breasts: I did not appreciate any right breast mass .There are no skin or nipple changes of concern. The right axilla is benign. Left breast is unremarkable.   LAB RESULTS:  Lab Results  Component Value Date   WBC 2.9* 07/28/2013   NEUTROABS 2.2 07/28/2013   HGB 10.0* 07/28/2013   HCT 30.5* 07/28/2013   MCV 95.6 07/28/2013   PLT 162 07/28/2013      Chemistry      Component Value Date/Time   NA 144 07/28/2013 1059   NA 138 06/30/2013 0747   K 3.7 07/28/2013 1059   K 4.1 06/30/2013 0747   CL 101 06/30/2013 0747   CO2 26 07/28/2013 1059   CO2 25 06/30/2013 0747   BUN 14.5 07/28/2013 1059   BUN 23 06/30/2013 0747   CREATININE 1.1 07/28/2013 1059   CREATININE 1.22* 06/30/2013 0747      Component Value Date/Time   CALCIUM 9.5 07/28/2013 1059   CALCIUM 8.9 06/30/2013 0747   ALKPHOS 81 07/28/2013 1059   ALKPHOS 65 05/27/2013 0530   AST 14 07/28/2013 1059   AST 7 05/27/2013 0530   ALT 26 07/28/2013 1059   ALT 11 05/27/2013 0530   BILITOT 0.65 07/28/2013 1059   BILITOT 0.5 05/27/2013 0530  ASSESSMENT: 58 y.o. BRCA1 and BRCA2 negative [place] woman   (1)  status post right breast biopsy 02/28/2013 for a clinical T2 N0, stage IIA invasive ductal carcinoma,  grade not stated, triple negative, with an MIB-1 of 99%.  (2)  being treated in the neoadjuvant setting,   (a) completed 4 dose dense cycles of doxorubicin/cyclophosphamide 05/19/2013. Cycle 4 was delayed one week because of bad weather.  Neulasta on day 2 for granulocyte support.  (b) receiving 12 weekly doses of carboplatin/paclitaxel prior to definitive surgery, first dose 06/09/2013.  (3)  recurrent nephrolithiasis - followed by Dr. Diona Fanti  PLAN:  Sydney Ford is tolerating the chemotherapy well without any major side effects.  In view of patient's  Sweating and  Fatigue, I have asked her to decrease the dexamethasone or to stop and see if that would help her. Continue seventh weekly cycle of carboplatin and Taxol today. Even though her WBC is slightly low, her ANC is 2200 which is acceptable for treatment today  Follow up in 1 week for 8th week of Taxol and carboplatinum with CBC and differential and CMP on the day of next visit   She is in agreement with the current plan of care .She will call with any problems that may develop before the next visit here.  Wilmon Arms, MD    Medical oncology 07/28/2013

## 2013-08-01 ENCOUNTER — Telehealth: Payer: Self-pay | Admitting: *Deleted

## 2013-08-01 NOTE — Telephone Encounter (Signed)
ON 07/31/13 PT. PICKED UP A RECYCLED PLASTIC BAG AND TOSSED IT INTO HER CAR. HER HANDS FROM THE WRISTS TO KNUCKLES BEGAN TO SWELL, BURN AND TURN RED. THIS NORMALLY LAST AROUND 20 MINUTES BUT IT HAS NOT STOPPED. PT. TRIED CORTAID AND GOLD BOND. WITH LITTLE RELIEF. PT. ALSO STATES SHE HAS HAD SIX LOOSE STOOLS IN THE PAST 24 HOURS. PT. HAS NOT TAKEN ANYTHING FOR THE LOOSE STOOLS. INSTRUCTED PT. TO TAKE TWO IMODIUM NOW AND THEN AS DIRECTED ON THE BOX. SHE VOICES UNDERSTANDING. SPOKE WITH DR.MAGRINAT'S NURSE, AMY MITCHELL,RN. INSTRUCTED PT. TO APPLY BENADRYL CREAM ON HER HANDS AND AMY MITCHELL,RN WILL CALL TO CHECK ON PT.

## 2013-08-04 ENCOUNTER — Other Ambulatory Visit (HOSPITAL_BASED_OUTPATIENT_CLINIC_OR_DEPARTMENT_OTHER): Payer: BC Managed Care – PPO

## 2013-08-04 ENCOUNTER — Ambulatory Visit (HOSPITAL_BASED_OUTPATIENT_CLINIC_OR_DEPARTMENT_OTHER): Payer: BC Managed Care – PPO

## 2013-08-04 ENCOUNTER — Encounter: Payer: Self-pay | Admitting: Physician Assistant

## 2013-08-04 ENCOUNTER — Ambulatory Visit (HOSPITAL_BASED_OUTPATIENT_CLINIC_OR_DEPARTMENT_OTHER): Payer: BC Managed Care – PPO | Admitting: Physician Assistant

## 2013-08-04 VITALS — BP 148/84 | HR 111 | Temp 98.3°F | Resp 18 | Ht 62.0 in | Wt 215.1 lb

## 2013-08-04 DIAGNOSIS — C50419 Malignant neoplasm of upper-outer quadrant of unspecified female breast: Secondary | ICD-10-CM

## 2013-08-04 DIAGNOSIS — C50411 Malignant neoplasm of upper-outer quadrant of right female breast: Secondary | ICD-10-CM

## 2013-08-04 DIAGNOSIS — R21 Rash and other nonspecific skin eruption: Secondary | ICD-10-CM

## 2013-08-04 DIAGNOSIS — D649 Anemia, unspecified: Secondary | ICD-10-CM

## 2013-08-04 DIAGNOSIS — N200 Calculus of kidney: Secondary | ICD-10-CM

## 2013-08-04 DIAGNOSIS — B370 Candidal stomatitis: Secondary | ICD-10-CM

## 2013-08-04 DIAGNOSIS — Z5111 Encounter for antineoplastic chemotherapy: Secondary | ICD-10-CM

## 2013-08-04 DIAGNOSIS — D696 Thrombocytopenia, unspecified: Secondary | ICD-10-CM

## 2013-08-04 DIAGNOSIS — C50919 Malignant neoplasm of unspecified site of unspecified female breast: Secondary | ICD-10-CM

## 2013-08-04 LAB — COMPREHENSIVE METABOLIC PANEL (CC13)
ALT: 30 U/L (ref 0–55)
AST: 21 U/L (ref 5–34)
Albumin: 3.4 g/dL — ABNORMAL LOW (ref 3.5–5.0)
Alkaline Phosphatase: 78 U/L (ref 40–150)
Anion Gap: 13 mEq/L — ABNORMAL HIGH (ref 3–11)
BUN: 14.1 mg/dL (ref 7.0–26.0)
CHLORIDE: 105 meq/L (ref 98–109)
CO2: 24 mEq/L (ref 22–29)
CREATININE: 1.2 mg/dL — AB (ref 0.6–1.1)
Calcium: 9 mg/dL (ref 8.4–10.4)
Glucose: 107 mg/dl (ref 70–140)
Potassium: 3.8 mEq/L (ref 3.5–5.1)
SODIUM: 142 meq/L (ref 136–145)
TOTAL PROTEIN: 6.1 g/dL — AB (ref 6.4–8.3)
Total Bilirubin: 0.92 mg/dL (ref 0.20–1.20)

## 2013-08-04 LAB — CBC WITH DIFFERENTIAL/PLATELET
BASO%: 0.5 % (ref 0.0–2.0)
Basophils Absolute: 0 10*3/uL (ref 0.0–0.1)
EOS%: 0.5 % (ref 0.0–7.0)
Eosinophils Absolute: 0 10*3/uL (ref 0.0–0.5)
HEMATOCRIT: 25.8 % — AB (ref 34.8–46.6)
HGB: 8.7 g/dL — ABNORMAL LOW (ref 11.6–15.9)
LYMPH%: 18.5 % (ref 14.0–49.7)
MCH: 32.2 pg (ref 25.1–34.0)
MCHC: 33.7 g/dL (ref 31.5–36.0)
MCV: 95.5 fL (ref 79.5–101.0)
MONO#: 0.2 10*3/uL (ref 0.1–0.9)
MONO%: 9.2 % (ref 0.0–14.0)
NEUT#: 1.4 10*3/uL — ABNORMAL LOW (ref 1.5–6.5)
NEUT%: 71.3 % (ref 38.4–76.8)
Platelets: 87 10*3/uL — ABNORMAL LOW (ref 145–400)
RBC: 2.71 10*6/uL — AB (ref 3.70–5.45)
RDW: 18.3 % — ABNORMAL HIGH (ref 11.2–14.5)
WBC: 2 10*3/uL — AB (ref 3.9–10.3)
lymph#: 0.4 10*3/uL — ABNORMAL LOW (ref 0.9–3.3)

## 2013-08-04 MED ORDER — FAMOTIDINE IN NACL 20-0.9 MG/50ML-% IV SOLN
INTRAVENOUS | Status: AC
  Filled 2013-08-04: qty 50

## 2013-08-04 MED ORDER — PACLITAXEL CHEMO INJECTION 300 MG/50ML
80.0000 mg/m2 | Freq: Once | INTRAVENOUS | Status: AC
  Administered 2013-08-04: 168 mg via INTRAVENOUS
  Filled 2013-08-04: qty 28

## 2013-08-04 MED ORDER — DEXAMETHASONE SODIUM PHOSPHATE 20 MG/5ML IJ SOLN
INTRAMUSCULAR | Status: AC
  Filled 2013-08-04: qty 5

## 2013-08-04 MED ORDER — DIPHENHYDRAMINE HCL 50 MG/ML IJ SOLN
INTRAMUSCULAR | Status: AC
  Filled 2013-08-04: qty 1

## 2013-08-04 MED ORDER — DIPHENHYDRAMINE HCL 50 MG/ML IJ SOLN
25.0000 mg | Freq: Once | INTRAMUSCULAR | Status: AC
  Administered 2013-08-04: 25 mg via INTRAVENOUS

## 2013-08-04 MED ORDER — SODIUM CHLORIDE 0.9 % IJ SOLN
10.0000 mL | INTRAMUSCULAR | Status: DC | PRN
  Administered 2013-08-04: 10 mL
  Filled 2013-08-04: qty 10

## 2013-08-04 MED ORDER — DEXAMETHASONE SODIUM PHOSPHATE 20 MG/5ML IJ SOLN
20.0000 mg | Freq: Once | INTRAMUSCULAR | Status: AC
  Administered 2013-08-04: 20 mg via INTRAVENOUS

## 2013-08-04 MED ORDER — NYSTATIN 100000 UNIT/ML MT SUSP: 5.0000 mL | Freq: Four times a day (QID) | OROMUCOSAL | Status: DC

## 2013-08-04 MED ORDER — HEPARIN SOD (PORK) LOCK FLUSH 100 UNIT/ML IV SOLN
500.0000 [IU] | Freq: Once | INTRAVENOUS | Status: AC | PRN
  Administered 2013-08-04: 500 [IU]
  Filled 2013-08-04: qty 5

## 2013-08-04 MED ORDER — SODIUM CHLORIDE 0.9 % IV SOLN
Freq: Once | INTRAVENOUS | Status: AC
  Administered 2013-08-04: 12:00:00 via INTRAVENOUS

## 2013-08-04 MED ORDER — FAMOTIDINE IN NACL 20-0.9 MG/50ML-% IV SOLN
20.0000 mg | Freq: Once | INTRAVENOUS | Status: AC
  Administered 2013-08-04: 20 mg via INTRAVENOUS

## 2013-08-04 MED ORDER — ONDANSETRON 16 MG/50ML IVPB (CHCC)
INTRAVENOUS | Status: AC
  Filled 2013-08-04: qty 16

## 2013-08-04 MED ORDER — ONDANSETRON 16 MG/50ML IVPB (CHCC)
16.0000 mg | Freq: Once | INTRAVENOUS | Status: AC
  Administered 2013-08-04: 16 mg via INTRAVENOUS

## 2013-08-04 NOTE — Progress Notes (Signed)
[place]  [phone] [phone]     ID: [name] OB: 21-Jun-1955  MR#: [id number]  WNU#:[id number]  PCP: Sydney Bolt, MD GYN:   SU: Sydney Bookbinder, MD OTHER MD: Sydney Blossom, MD;  Sydney Gallo, MD  CHIEF COMPLAINT:  Right breast cancer/neoadjuvant chemotherapy   BREAST CANCER HISTORY: As per previously documented note:  Sydney Ford (pronounced "line 'em") herself noted a change in her right breast sometime October 2014 but "my breasts are like bean bags" and she initially ignored it. After while she began to feel a pole when she lay on her right side at night so she scheduled herself for screening mammography at [facility] 02/08/2013 (prior mammogram had been April 2011), and this did show a possible mass in the right breast. Unilateral right diagnostic mammography and ultrasonography 02/28/2013 confirmed an irregular mass in the right breast upper outer quadrant measuring 2.4 cm. This was palpable at the 11:00 position. Ultrasound showed an irregular hypoechoic mass measuring 1.5 cm with no abnormal adenopathy in the right axilla.  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  The patient met with Dr. Donne Ford 03/14/2013 and he recommended primary systemic chemotherapy to decrease the size of the tumor and increase the chance of lumpectomy.   Subsequent history is as detailed below.  INTERVAL HISTORY: Sydney Ford returns alone today for followup of her locally advanced right breast cancer. She continues to receive neoadjuvant chemotherapy, and is due for her eighth of 12 planned weekly doses of carboplatin/paclitaxel today.   Sydney Ford is extremely tired today. She is having a lot of taste aversion and is finding it difficult to eat  solid foods. She is able to eat soup, drinking fluids, and keep herself well hydrated. She's having frequent soft bowel movements, but no loose stools. She has some occasional nausea and reflux, but admits that she has not been taking her Prilosec regularly.  Her mouth is slightly sensitive and her teeth feel "coated", but she denies any ulcerations.  Sydney Ford also developed a rash on the dorsal surfaces of the hands bilaterally, and also on her face following her most recent dose of chemotherapy last week. She also notes that she decreased the amount of dexamethasone she was taking after treatment last week, and this is the first time she has experienced such a rash. Her hands especially have been very red and dry, but with no actual cracking or peeling. This has improved significantly over the past week.  Sydney Ford continues to have intermittent flank pain associated with her recurrent kidney stones, and this has not worsened or changed. She is able to urinate normally, and denies any dysuria or hematuria.   REVIEW OF SYSTEMS: Pa has had no known fevers or chills. She does have some hot flashes. Her energy level is low and her hemoglobin has decreased significantly to 8.7 today. She denies any abnormal bruising or bleeding. She does have some shortness of breath with even minor exertion. She denies any increased cough or phlegm production. She has had no orthopnea, increased swelling in the extremities, chest pain, or palpitations. She has had no abnormal headaches or dizziness. She currently denies any new myalgias, arthralgias, bony pain. She also continues to deny any signs of neuropathy in either the upper or lower extremities.  A detailed review of systems  is otherwise stable and noncontributory.    PAST MEDICAL HISTORY: Past Medical History  Diagnosis Date  . Hypertension   . Hyperlipidemia   . Thyroid disease   . Osteoporosis   . Hypothyroidism     hashimoto's  . Depression   . Pneumonia      2014  . Cold (disease) 03/21/12    couple of weeks ago - pt states much better now  . History of kidney stones     "passed 9 stones"  . GERD (gastroesophageal reflux disease)     no meds except occas tums  . Headache(784.0)     occas migraine  . Cancer     new dx of right breast cancer - planning chemo first  . Arthritis     oa; lower back pain - hx of injury yrs ago-occas flare ups  . Anemia     2005 - related to heavy menses with the thyroid probl  . Chronic kidney disease     kidney stone    PAST SURGICAL HISTORY:  (Updated 07/07/2013) Past Surgical History  Procedure Laterality Date  . Cesarean section  1987  . Tonsillectomy    . Uterine ablation  2008    no menstrual periods since procedure  . Portacath placement N/A 03/23/2013    Procedure: INSERTION PORT-A-CATH;  Surgeon: Sydney Bookbinder, MD;  Location: [facility];  Service: General;  Laterality: N/A;  . Cystoscopy with retrograde pyelogram, ureteroscopy and stent placement Right 06/30/2013    Procedure: CYSTOSCOPY WITH RETROGRADE PYELOGRAM, URETEROSCOPY, STONE EXTRACTION AND  STENT PLACEMENT;  Surgeon: Sydney Gallo, MD;  Location: [facility];  Service: Urology;  Laterality: Right;  . Holmium laser application Right 4/00/8676    Procedure: HOLMIUM LASER APPLICATION;  Surgeon: Sydney Gallo, MD;  Location: [facility];  Service: Urology;  Laterality: Right;    FAMILY HISTORY Family History  Problem Relation Age of Onset  . Heart failure Mother   . Heart attack Father   . Autoimmune disease Maternal Aunt     lupus, scelraderma, reynolds disease, and others  . Stroke Paternal Uncle   . Heart failure Maternal Grandmother   . Heart attack Maternal Grandfather   . Throat cancer Paternal Grandmother     dx late 47s; non smoker or drinker  . Heart attack Paternal Grandfather   . Schizophrenia Son 69  . Heart attack Paternal Uncle    the patient's father died at the age of 30 from a myocardial infarction. The patient's mother  died at the age of 84 with congestive heart failure. She also had a history of lupus. The patient had no brothers or sisters. There is no history of breast or ovarian cancer in the family to her knowledge.  GYNECOLOGIC HISTORY:   (reviewed on 08/04/2013)  Menarche age 58, first live birth age 76. She is GX P2. She underwent endometrial ablation in 2009 and has not had a period since that time. She never took hormone replacement. She did use birth control pills remotely with no complications  SOCIAL HISTORY: (Reviewd 08/04/2013) and  Sydney Ford works at [employer] for a variety of long-term clients. She does not work for a company. Her husband [name] is semiretired. He works for a company that [employer] data on [place]. Son [name] is in [place] and works in Architect. Son [name] lives at home and is disabled with a diagnosis of paranoid schizophrenia. The patient has no grandchildren. She is not a church attender  ADVANCED DIRECTIVES: Not in place   HEALTH MAINTENANCE: ( reviewed 08/04/2013 ) History  Substance Use Topics  . Smoking status: Former Smoker    Quit date: 03/17/1985  . Smokeless tobacco: Never Used  . Alcohol Use: No     Colonoscopy: Never  PAP: Remote  Bone density: November 2014, showed osteoporosis   according to the patient's report  Lipid panel:  Not on file, Dr. Wilson Singer   Allergies  Allergen Reactions  . Codeine Itching and Nausea Only    REACTION: Nausea and itching  . Compazine [Prochlorperazine Edisylate] Other (See Comments)    Head aches   . Other     Close contact with Some recycled plastics cause whelps  . Tape Rash    Needs OPSITE dressing with port access    Current Outpatient Prescriptions  Medication Sig Dispense Refill  . atorvastatin (LIPITOR) 20 MG tablet Take 20 mg by mouth every evening.       . lidocaine-prilocaine (EMLA) cream Apply 1 application topically daily as needed. port      . omeprazole (PRILOSEC) 40 MG  capsule Take 1 capsule (40 mg total) by mouth daily.  30 capsule  4  . oxybutynin (DITROPAN) 5 MG tablet Take 1 tablet (5 mg total) by mouth 3 (three) times daily.  30 tablet  1  . PRESCRIPTION MEDICATION Chemotherapy at [facility]      . sertraline (ZOLOFT) 100 MG tablet TAKE ONE TABLET BY MOUTH AT BEDTIME  30 tablet  0  . tamsulosin (FLOMAX) 0.4 MG CAPS capsule       . thyroid (ARMOUR) 90 MG tablet Take 90 mg by mouth every morning.      . traZODone (DESYREL) 50 MG tablet Take 1 tablet (50 mg total) by mouth at bedtime as needed for sleep.  30 tablet  1  . dexamethasone (DECADRON) 4 MG tablet TAKE 2 TABLETS BY MOUTH ONCE A DAY ON THE DAY AFTER CHEMOTHERAPY AND THEN TAKE 2 TABLETS TWICE DAILY FOR 2 DAYS WITH FOOD.  60 tablet  0  . LORazepam (ATIVAN) 0.5 MG tablet Take 0.5 mg by mouth every 8 (eight) hours as needed (nausea).       . metoCLOPramide (REGLAN) 5 MG tablet Take 1 tablet (5 mg total) by mouth 3 (three) times daily before meals.  90 tablet  4  . nitrofurantoin, macrocrystal-monohydrate, (MACROBID) 100 MG capsule       . ondansetron (ZOFRAN) 8 MG tablet Take 1 tablet (8 mg total) by mouth every 8 (eight) hours as needed for nausea or vomiting.  20 tablet  0  . promethazine (PHENERGAN) 25 MG suppository Place 1 suppository (25 mg total) rectally every 8 (eight) hours as needed for nausea or vomiting.  12 each  0  . traMADol (ULTRAM) 50 MG tablet Take 1-2 tablets (50-100 mg total) by mouth 3 (three) times daily as needed for moderate pain or severe pain.  100 tablet  0   No current facility-administered medications for this visit.   Facility-Administered Medications Ordered in Other Visits  Medication Dose Route Frequency Provider Last Rate Last Dose  . heparin lock flush 100 unit/mL  500 Units Intracatheter Once PRN Kalee Broxton Milda Smart, PA-C      . ondansetron (ZOFRAN) IVPB 16 mg  16 mg Intravenous Once Laurice Iglesia G Odarius Dines, PA-C   16 mg at 08/04/13 1245  . PACLitaxel (TAXOL) 168 mg in dextrose 5 % 250 mL  chemo infusion (</= 58m/m2)  80 mg/m2 (Treatment Plan Actual)  Intravenous Once Brynne Doane G Glennette Galster, PA-C      . sodium chloride 0.9 % injection 10 mL  10 mL Intracatheter PRN Ezrah Dembeck Allegra Grana, PA-C        PHYSICAL EXAM: Middle-aged Caucasian female who appears very tired but is in no acute distress    Filed Vitals:   08/04/13 1122  BP: 148/84  Pulse: 111  Temp: 98.3 F (36.8 C)  Resp: 18     Body mass index is 39.33 kg/(m^2).    ECOG FS: 1 Filed Weights   08/04/13 1122  Weight: 215 lb 1.6 oz (97.569 kg)   Physical Exam: HEENT:  Sclerae anicteric. Conjunctivae pale. Oropharynx is moist. There are no ulcerations. There is evidence of a white coating in the roof of the mouth and the posterior buccal mucosa consistent with candidiasis. Neck is supple, trachea midline.  NODES:  No cervical or supraclavicular lymphadenopathy palpated.  BREAST EXAM:  Breast exam deferred. Axillae are benign bilaterally, no palpable lymphadenopathy. LUNGS:  Clear to auscultation bilaterally.  No wheezes or rhonchi HEART:  Regular rate and rhythm. No murmur  Appreciated. ABDOMEN:  Soft, obese, nontender.  Positive, slightly hyperactive bowel sounds.  MSK:  No focal spinal tenderness to palpation. Full range of motion bilaterally in the upper extremities. EXTREMITIES:  No peripheral edema.  No clubbing or cyanosis. SKIN:  The dorsal surfaces of the hands bilaterally are mildly erythematous and dry, but with no vesicles or pustules, and no cracking or peeling. There is a similar-appearing skin change on the forehead. No additional rashes or skin changes are noted. No excessive ecchymoses. No petechiae. Port is intact in the chest wall with no erythema or edema, and no evidence of infection/cellulitis. NEURO:  Nonfocal. Well oriented.  Appropriate affect.    LAB RESULTS:  Lab Results  Component Value Date   WBC 2.0* 08/04/2013   NEUTROABS 1.4* 08/04/2013   HGB 8.7* 08/04/2013   HCT 25.8* 08/04/2013   MCV 95.5 08/04/2013    PLT 87 repeated and verified* 08/04/2013      Chemistry      Component Value Date/Time   NA 142 08/04/2013 1039   NA 138 06/30/2013 0747   K 3.8 08/04/2013 1039   K 4.1 06/30/2013 0747   CL 101 06/30/2013 0747   CO2 24 08/04/2013 1039   CO2 25 06/30/2013 0747   BUN 14.1 08/04/2013 1039   BUN 23 06/30/2013 0747   CREATININE 1.2* 08/04/2013 1039   CREATININE 1.22* 06/30/2013 0747      Component Value Date/Time   CALCIUM 9.0 08/04/2013 1039   CALCIUM 8.9 06/30/2013 0747   ALKPHOS 78 08/04/2013 1039   ALKPHOS 65 05/27/2013 0530   AST 21 08/04/2013 1039   AST 7 05/27/2013 0530   ALT 30 08/04/2013 1039   ALT 11 05/27/2013 0530   BILITOT 0.92 08/04/2013 1039   BILITOT 0.5 05/27/2013 0530       ASSESSMENT: 58 y.o. 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  (2)  being treated in the neoadjuvant setting,   (a) completed 4 dose dense cycles of doxorubicin/cyclophosphamide 05/19/2013. Cycle 4 was delayed one week because of bad weather.  Neulasta on day 2 for granulocyte support.  (b) receiving 12 weekly doses of carboplatin/paclitaxel prior to definitive surgery, first dose 06/09/2013.  (3)  recurrent nephrolithiasis -  followed by Dr. Diona Fanti  (4)  thrombocytopenia, asymptomatic - likely secondary to carboplatin  (5)  rash on the hands and face, possibly secondary to paclitaxel  PLAN:  Dr. Jana Hakim has reviewed this case, and Sydney Ford will proceed to treatment today as scheduled but we will hold her carboplatin and she will receive paclitaxel alone this week. We will reassess when she returns next week on the 28th and decide whether or not to resume carboplatin.  Primarily, this decision will be based on her lab results, especially her platelet count.   We have advised Sydney Ford to resume her regular dosing of oral dexamethasone following  this cycle to see if this prevents any additional rashes. I also encouraged her to keep herself well hydrated, and we discussed various options of food and beverage that might be more palatable to her.  I have prescribed nystatin suspension to use 4 times daily for the next 7 days for oropharyngeal candidiasis.  We will continue to see Sydney Ford on a weekly basis when she returns for treatments. She voices her understanding and agreement with the above plan, noticed called any changes or problems.   Theotis Burrow, PA-C    Medical oncology 08/04/2013

## 2013-08-04 NOTE — Patient Instructions (Signed)
Paclitaxel injection  What is this medicine?  PACLITAXEL (PAK li TAX el) is a chemotherapy drug. It targets fast dividing cells, like cancer cells, and causes these cells to die. This medicine is used to treat ovarian cancer, breast cancer, and other cancers.  This medicine may be used for other purposes; ask your health care provider or pharmacist if you have questions.  COMMON BRAND NAME(S): Onxol , Taxol  What should I tell my health care provider before I take this medicine?  They need to know if you have any of these conditions:  -blood disorders  -irregular heartbeat  -infection (especially a virus infection such as chickenpox, cold sores, or herpes)  -liver disease  -previous or ongoing radiation therapy  -an unusual or allergic reaction to paclitaxel, alcohol, polyoxyethylated castor oil, other chemotherapy agents, other medicines, foods, dyes, or preservatives  -pregnant or trying to get pregnant  -breast-feeding  How should I use this medicine?  This drug is given as an infusion into a vein. It is administered in a hospital or clinic by a specially trained health care professional.  Talk to your pediatrician regarding the use of this medicine in children. Special care may be needed.  Overdosage: If you think you have taken too much of this medicine contact a poison control center or emergency room at once.  NOTE: This medicine is only for you. Do not share this medicine with others.  What if I miss a dose?  It is important not to miss your dose. Call your doctor or health care professional if you are unable to keep an appointment.  What may interact with this medicine?  Do not take this medicine with any of the following medications:  -disulfiram  -metronidazole  This medicine may also interact with the following medications:  -cyclosporine  -diazepam  -ketoconazole  -medicines to increase blood counts like filgrastim, pegfilgrastim, sargramostim  -other chemotherapy drugs like cisplatin, doxorubicin,  epirubicin, etoposide, teniposide, vincristine  -quinidine  -testosterone  -vaccines  -verapamil  Talk to your doctor or health care professional before taking any of these medicines:  -acetaminophen  -aspirin  -ibuprofen  -ketoprofen  -naproxen  This list may not describe all possible interactions. Give your health care provider a list of all the medicines, herbs, non-prescription drugs, or dietary supplements you use. Also tell them if you smoke, drink alcohol, or use illegal drugs. Some items may interact with your medicine.  What should I watch for while using this medicine?  Your condition will be monitored carefully while you are receiving this medicine. You will need important blood work done while you are taking this medicine.  This drug may make you feel generally unwell. This is not uncommon, as chemotherapy can affect healthy cells as well as cancer cells. Report any side effects. Continue your course of treatment even though you feel ill unless your doctor tells you to stop.  In some cases, you may be given additional medicines to help with side effects. Follow all directions for their use.  Call your doctor or health care professional for advice if you get a fever, chills or sore throat, or other symptoms of a cold or flu. Do not treat yourself. This drug decreases your body's ability to fight infections. Try to avoid being around people who are sick.  This medicine may increase your risk to bruise or bleed. Call your doctor or health care professional if you notice any unusual bleeding.  Be careful brushing and flossing your teeth or   using a toothpick because you may get an infection or bleed more easily. If you have any dental work done, tell your dentist you are receiving this medicine.  Avoid taking products that contain aspirin, acetaminophen, ibuprofen, naproxen, or ketoprofen unless instructed by your doctor. These medicines may hide a fever.  Do not become pregnant while taking this medicine.  Women should inform their doctor if they wish to become pregnant or think they might be pregnant. There is a potential for serious side effects to an unborn child. Talk to your health care professional or pharmacist for more information. Do not breast-feed an infant while taking this medicine.  Men are advised not to father a child while receiving this medicine.  What side effects may I notice from receiving this medicine?  Side effects that you should report to your doctor or health care professional as soon as possible:  -allergic reactions like skin rash, itching or hives, swelling of the face, lips, or tongue  -low blood counts - This drug may decrease the number of white blood cells, red blood cells and platelets. You may be at increased risk for infections and bleeding.  -signs of infection - fever or chills, cough, sore throat, pain or difficulty passing urine  -signs of decreased platelets or bleeding - bruising, pinpoint red spots on the skin, black, tarry stools, nosebleeds  -signs of decreased red blood cells - unusually weak or tired, fainting spells, lightheadedness  -breathing problems  -chest pain  -high or low blood pressure  -mouth sores  -nausea and vomiting  -pain, swelling, redness or irritation at the injection site  -pain, tingling, numbness in the hands or feet  -slow or irregular heartbeat  -swelling of the ankle, feet, hands  Side effects that usually do not require medical attention (report to your doctor or health care professional if they continue or are bothersome):  -bone pain  -complete hair loss including hair on your head, underarms, pubic hair, eyebrows, and eyelashes  -changes in the color of fingernails  -diarrhea  -loosening of the fingernails  -loss of appetite  -muscle or joint pain  -red flush to skin  -sweating  This list may not describe all possible side effects. Call your doctor for medical advice about side effects. You may report side effects to FDA at  1-800-FDA-1088.  Where should I keep my medicine?  This drug is given in a hospital or clinic and will not be stored at home.  NOTE: This sheet is a summary. It may not cover all possible information. If you have questions about this medicine, talk to your doctor, pharmacist, or health care provider.   2014, Elsevier/Gold Standard. (2012-04-26 16:41:21)

## 2013-08-11 ENCOUNTER — Encounter: Payer: Self-pay | Admitting: Adult Health

## 2013-08-11 ENCOUNTER — Ambulatory Visit: Payer: BC Managed Care – PPO

## 2013-08-11 ENCOUNTER — Other Ambulatory Visit (HOSPITAL_BASED_OUTPATIENT_CLINIC_OR_DEPARTMENT_OTHER): Payer: BC Managed Care – PPO

## 2013-08-11 ENCOUNTER — Ambulatory Visit (HOSPITAL_BASED_OUTPATIENT_CLINIC_OR_DEPARTMENT_OTHER): Payer: BC Managed Care – PPO | Admitting: Adult Health

## 2013-08-11 ENCOUNTER — Telehealth: Payer: Self-pay | Admitting: Physician Assistant

## 2013-08-11 ENCOUNTER — Other Ambulatory Visit: Payer: Self-pay | Admitting: *Deleted

## 2013-08-11 VITALS — BP 136/83 | HR 114 | Temp 98.3°F | Resp 18 | Ht 62.0 in | Wt 215.2 lb

## 2013-08-11 DIAGNOSIS — M1990 Unspecified osteoarthritis, unspecified site: Secondary | ICD-10-CM

## 2013-08-11 DIAGNOSIS — C50419 Malignant neoplasm of upper-outer quadrant of unspecified female breast: Secondary | ICD-10-CM

## 2013-08-11 DIAGNOSIS — C50411 Malignant neoplasm of upper-outer quadrant of right female breast: Secondary | ICD-10-CM

## 2013-08-11 LAB — COMPREHENSIVE METABOLIC PANEL (CC13)
ALBUMIN: 3.4 g/dL — AB (ref 3.5–5.0)
ALT: 26 U/L (ref 0–55)
ANION GAP: 12 meq/L — AB (ref 3–11)
AST: 14 U/L (ref 5–34)
Alkaline Phosphatase: 70 U/L (ref 40–150)
BILIRUBIN TOTAL: 0.98 mg/dL (ref 0.20–1.20)
BUN: 11.8 mg/dL (ref 7.0–26.0)
CO2: 26 mEq/L (ref 22–29)
Calcium: 8.9 mg/dL (ref 8.4–10.4)
Chloride: 105 mEq/L (ref 98–109)
Creatinine: 1.1 mg/dL (ref 0.6–1.1)
Glucose: 128 mg/dl (ref 70–140)
Potassium: 3.3 mEq/L — ABNORMAL LOW (ref 3.5–5.1)
SODIUM: 143 meq/L (ref 136–145)
TOTAL PROTEIN: 6 g/dL — AB (ref 6.4–8.3)

## 2013-08-11 LAB — CBC WITH DIFFERENTIAL/PLATELET
BASO%: 0 % (ref 0.0–2.0)
Basophils Absolute: 0 10*3/uL (ref 0.0–0.1)
EOS%: 0.6 % (ref 0.0–7.0)
Eosinophils Absolute: 0 10*3/uL (ref 0.0–0.5)
HCT: 26 % — ABNORMAL LOW (ref 34.8–46.6)
HGB: 8.8 g/dL — ABNORMAL LOW (ref 11.6–15.9)
LYMPH#: 0.4 10*3/uL — AB (ref 0.9–3.3)
LYMPH%: 25 % (ref 14.0–49.7)
MCH: 31.5 pg (ref 25.1–34.0)
MCHC: 33.8 g/dL (ref 31.5–36.0)
MCV: 93.2 fL (ref 79.5–101.0)
MONO#: 0.1 10*3/uL (ref 0.1–0.9)
MONO%: 3 % (ref 0.0–14.0)
NEUT#: 1.2 10*3/uL — ABNORMAL LOW (ref 1.5–6.5)
NEUT%: 71.4 % (ref 38.4–76.8)
Platelets: 72 10*3/uL — ABNORMAL LOW (ref 145–400)
RBC: 2.79 10*6/uL — ABNORMAL LOW (ref 3.70–5.45)
RDW: 16.8 % — AB (ref 11.2–14.5)
WBC: 1.6 10*3/uL — ABNORMAL LOW (ref 3.9–10.3)

## 2013-08-11 MED ORDER — TRAMADOL HCL 50 MG PO TABS: 50.0000 mg | ORAL_TABLET | Freq: Three times a day (TID) | ORAL | Status: DC | PRN

## 2013-08-11 MED ORDER — ONDANSETRON HCL 8 MG PO TABS: 8.0000 mg | ORAL_TABLET | Freq: Three times a day (TID) | ORAL | Status: DC | PRN

## 2013-08-11 NOTE — Progress Notes (Signed)
[place]  Telephone:(336) [phone] Fax:(336) [phone]     ID: [name] OB: October 15, 1955  MR#: [id number]  LZJ#:[id number]  PCP: Sydney Bolt, MD GYN:   SU: Sydney Bookbinder, MD OTHER MD: Sydney Blossom, MD;  Sydney Gallo, MD  CHIEF COMPLAINT:  Right breast cancer/neoadjuvant chemotherapy   BREAST CANCER HISTORY: As per previously documented note:  Sydney Ford (pronounced "line 'em") herself noted a change in her right breast sometime October 2014 but "my breasts are like bean bags" and she initially ignored it. After while she began to feel a pole when she lay on her right side at night so she scheduled herself for screening mammography at the [facility] 02/08/2013 (prior mammogram had been April 2011), and this did show a possible mass in the right breast. Unilateral right diagnostic mammography and ultrasonography 02/28/2013 confirmed an irregular mass in the right breast upper outer quadrant measuring 2.4 cm. This was palpable at the 11:00 position. Ultrasound showed an irregular hypoechoic mass measuring 1.5 cm with no abnormal adenopathy in the right axilla.  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  The patient met with Sydney Ford 03/14/2013 and he recommended primary systemic chemotherapy to decrease the size of the tumor and increase the chance of lumpectomy.   Subsequent history is as detailed below.  INTERVAL HISTORY: Sydney Ford returns alone today for followup of her locally advanced right breast cancer. She continues to receive neoadjuvant chemotherapy, and is due for her ninth of 12 planned weekly doses of carboplatin/paclitaxel today.   Sydney Ford is here for evaluation prior to cycle 9 of neoadjuvant carboplatin/paclitaxel.  She is feeling very  tired.  Her right thumb nail is becoming brittle, she is nauseated and it is not relieved with her anti-emetics.  She is not vomiting, her appetite is limited including popsicle and oodles and noodles.  She has been having low grade fevers, none higher than 99.5, some chills, and she had 6-7 loose bowel movements yesterday.  She has also had a headache since last night.  The headache is described as "annoying" and all over her head.  She took Tramadol for it and it made her sleep, however, when she woke up it was still there.  She is having worsening vision changes.  She has numbness and tingling.  She is noticing that she is dragging her feet, and is occasionally dizzy. She has noticed that her handwriting has changed.  She denies fine motor changes such as buttoning,a nd putting in her earrings.  She is requesting her Tramadol be refilled.  She does take Benadryl daily for rash that was possibly related to the Taxol.  She has had the rash for two weeks.     REVIEW OF SYSTEMS: A 10 point review of systems was conducted and is otherwise negative except for what is noted above.      PAST MEDICAL HISTORY: Past Medical History  Diagnosis Date  . Hypertension   . Hyperlipidemia   . Thyroid disease   . Osteoporosis   . Hypothyroidism     hashimoto's  . Depression   . Pneumonia     2014  . Cold (disease) 03/21/12    couple of weeks ago - pt states much better now  . History of kidney stones     "passed 9 stones"  .  GERD (gastroesophageal reflux disease)     no meds except occas tums  . Headache(784.0)     occas migraine  . Cancer     new dx of right breast cancer - planning chemo first  . Arthritis     oa; lower back pain - hx of injury yrs ago-occas flare ups  . Anemia     2005 - related to heavy menses with the thyroid probl  . Chronic kidney disease     kidney stone    PAST SURGICAL HISTORY:  (Updated 07/07/2013) Past Surgical History  Procedure Laterality Date  . Cesarean section   1987  . Tonsillectomy    . Uterine ablation  2008    no menstrual periods since procedure  . Portacath placement N/A 03/23/2013    Procedure: INSERTION PORT-A-CATH;  Surgeon: Sydney Bookbinder, MD;  Location: [facility];  Service: General;  Laterality: N/A;  . Cystoscopy with retrograde pyelogram, ureteroscopy and stent placement Right 06/30/2013    Procedure: CYSTOSCOPY WITH RETROGRADE PYELOGRAM, URETEROSCOPY, STONE EXTRACTION AND  STENT PLACEMENT;  Surgeon: Sydney Gallo, MD;  Location: [facility];  Service: Urology;  Laterality: Right;  . Holmium laser application Right 1/61/0960    Procedure: HOLMIUM LASER APPLICATION;  Surgeon: Sydney Gallo, MD;  Location: [facility];  Service: Urology;  Laterality: Right;    FAMILY HISTORY Family History  Problem Relation Age of Onset  . Heart failure Mother   . Heart attack Father   . Autoimmune disease Maternal Aunt     lupus, scelraderma, reynolds disease, and others  . Stroke Paternal Uncle   . Heart failure Maternal Grandmother   . Heart attack Maternal Grandfather   . Throat cancer Paternal Grandmother     dx late 68s; non smoker or drinker  . Heart attack Paternal Grandfather   . Schizophrenia Son 65  . Heart attack Paternal Uncle    the patient's father died at the age of 54 from a myocardial infarction. The patient's mother died at the age of 9 with congestive heart failure. She also had a history of lupus. The patient had no brothers or sisters. There is no history of breast or ovarian cancer in the family to her knowledge.  GYNECOLOGIC HISTORY:   (reviewed on 08/04/2013)  Menarche age 67, first live birth age 89. She is GX P2. She underwent endometrial ablation in 2009 and has not had a period since that time. She never took hormone replacement. She did use birth control pills remotely with no complications  SOCIAL HISTORY: (Reviewd 08/04/2013) and  Sydney Ford works at [employer] for a variety of long-term clients. She does not work for a  company. Her husband Sydney Ford is semiretired. He works for a company that [employer] data on [place]. Son Sydney Ford is in [place] and works in Architect. Son Sydney Ford lives at home and is disabled with a diagnosis of paranoid schizophrenia. The patient has no grandchildren. She is not a church attender    ADVANCED DIRECTIVES: Not in place   HEALTH MAINTENANCE: ( reviewed 08/04/2013 ) History  Substance Use Topics  . Smoking status: Former Smoker    Quit date: 03/17/1985  . Smokeless tobacco: Never Used  . Alcohol Use: No     Colonoscopy: Never  PAP: Remote  Bone density: November 2014, showed osteoporosis   according to the patient's report  Lipid panel:  Not on file, Dr. Wilson Singer   Allergies  Allergen Reactions  . Codeine Itching and Nausea Only  REACTION: Nausea and itching  . Compazine [Prochlorperazine Edisylate] Other (See Comments)    Head aches   . Other     Close contact with Some recycled plastics cause whelps  . Tape Rash    Needs OPSITE dressing with port access    Current Outpatient Prescriptions  Medication Sig Dispense Refill  . atorvastatin (LIPITOR) 20 MG tablet Take 20 mg by mouth every evening.       . lidocaine-prilocaine (EMLA) cream Apply 1 application topically daily as needed. port      . metoCLOPramide (REGLAN) 5 MG tablet Take 1 tablet (5 mg total) by mouth 3 (three) times daily before meals.  90 tablet  4  . omeprazole (PRILOSEC) 40 MG capsule Take 1 capsule (40 mg total) by mouth daily.  30 capsule  4  . ondansetron (ZOFRAN) 8 MG tablet Take 1 tablet (8 mg total) by mouth every 8 (eight) hours as needed for nausea or vomiting.  20 tablet  1  . oxybutynin (DITROPAN) 5 MG tablet Take 1 tablet (5 mg total) by mouth 3 (three) times daily.  30 tablet  1  . PRESCRIPTION MEDICATION Chemotherapy at [facility]      . sertraline (ZOLOFT) 100 MG tablet TAKE ONE TABLET BY MOUTH AT BEDTIME  30 tablet  0  . tamsulosin (FLOMAX) 0.4 MG CAPS capsule        . thyroid (ARMOUR) 90 MG tablet Take 90 mg by mouth every morning.      . traZODone (DESYREL) 50 MG tablet Take 1 tablet (50 mg total) by mouth at bedtime as needed for sleep.  30 tablet  1  . dexamethasone (DECADRON) 4 MG tablet TAKE 2 TABLETS BY MOUTH ONCE A DAY ON THE DAY AFTER CHEMOTHERAPY AND THEN TAKE 2 TABLETS TWICE DAILY FOR 2 DAYS WITH FOOD.  60 tablet  0  . LORazepam (ATIVAN) 0.5 MG tablet Take 0.5 mg by mouth every 8 (eight) hours as needed (nausea).       . nitrofurantoin, macrocrystal-monohydrate, (MACROBID) 100 MG capsule       . nystatin (MYCOSTATIN) 100000 UNIT/ML suspension Take 5 mLs (500,000 Units total) by mouth 4 (four) times daily.  180 mL  2  . promethazine (PHENERGAN) 25 MG suppository Place 1 suppository (25 mg total) rectally every 8 (eight) hours as needed for nausea or vomiting.  12 each  0  . traMADol (ULTRAM) 50 MG tablet Take 1-2 tablets (50-100 mg total) by mouth 3 (three) times daily as needed for moderate pain or severe pain.  100 tablet  0   No current facility-administered medications for this visit.    PHYSICAL EXAM: Middle-aged Caucasian female who appears very tired but is in no acute distress    Filed Vitals:   08/11/13 1140  BP: 136/83  Pulse: 114  Temp: 98.3 F (36.8 C)  Resp: 18     Body mass index is 39.35 kg/(m^2).    ECOG FS: 1 Filed Weights   08/11/13 1140  Weight: 215 lb 3.2 oz (97.614 kg)   Physical Exam: HEENT:  Sclerae anicteric. Conjunctivae pale. Oropharynx is moist. There are no ulcerations. There is evidence of a white coating in the roof of the mouth and the posterior buccal mucosa consistent with candidiasis. Neck is supple, trachea midline.  NODES:  No cervical or supraclavicular lymphadenopathy palpated.  BREAST EXAM:  Breast exam deferred. Axillae are benign bilaterally, no palpable lymphadenopathy. LUNGS:  Clear to auscultation bilaterally.  No wheezes or rhonchi HEART:  Regular rate and rhythm. No murmur   Appreciated. ABDOMEN:  Soft, obese, nontender.  Positive, slightly hyperactive bowel sounds.  MSK:  No focal spinal tenderness to palpation. Full range of motion bilaterally in the upper extremities. EXTREMITIES:  No peripheral edema.  No clubbing or cyanosis. SKIN:  The dorsal surfaces of the hands bilaterally are mildly erythematous and dry, but with no vesicles or pustules, and no cracking or peeling. There is a similar-appearing skin change on the forehead. No additional rashes or skin changes are noted. No excessive ecchymoses. No petechiae. Port is intact in the chest wall with no erythema or edema, and no evidence of infection/cellulitis. NEURO:  Nonfocal. Well oriented.  Appropriate affect.    LAB RESULTS:  Lab Results  Component Value Date   WBC 1.6* 08/11/2013   NEUTROABS 1.2* 08/11/2013   HGB 8.8* 08/11/2013   HCT 26.0* 08/11/2013   MCV 93.2 08/11/2013   PLT 72* 08/11/2013      Chemistry      Component Value Date/Time   NA 143 08/11/2013 1129   NA 138 06/30/2013 0747   K 3.3* 08/11/2013 1129   K 4.1 06/30/2013 0747   CL 101 06/30/2013 0747   CO2 26 08/11/2013 1129   CO2 25 06/30/2013 0747   BUN 11.8 08/11/2013 1129   BUN 23 06/30/2013 0747   CREATININE 1.1 08/11/2013 1129   CREATININE 1.22* 06/30/2013 0747      Component Value Date/Time   CALCIUM 8.9 08/11/2013 1129   CALCIUM 8.9 06/30/2013 0747   ALKPHOS 70 08/11/2013 1129   ALKPHOS 65 05/27/2013 0530   AST 14 08/11/2013 1129   AST 7 05/27/2013 0530   ALT 26 08/11/2013 1129   ALT 11 05/27/2013 0530   BILITOT 0.98 08/11/2013 1129   BILITOT 0.5 05/27/2013 0530       ASSESSMENT: 58 y.o. 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  (2)  being treated in the neoadjuvant setting,   (a) completed 4 dose dense cycles of doxorubicin/cyclophosphamide 05/19/2013. Cycle 4 was  delayed one week because of bad weather.  Neulasta on day 2 for granulocyte support.  (b) receiving 12 weekly doses of carboplatin/paclitaxel prior to definitive surgery, first dose 06/09/2013.  (3)  recurrent nephrolithiasis - followed by Dr. Diona Fanti  (4)  thrombocytopenia, asymptomatic - likely secondary to carboplatin  (5)  rash on the hands and face, possibly secondary to paclitaxel  PLAN:  Due to the patient's neuropathy, we will repeat an MRI of the breast within the next week to evaluate how she is responding to treatment.  She will possibly receive Gemcitabine and Carboplatin next week depending on the MRI results.    Sydney Ford will return next week for labs, evaluation, and possible Gemcitabine/Carboplatin.    We will continue to see Sydney Ford on a weekly basis when she returns for treatments. She voices her understanding and agreement with the above plan, noticed called any changes or problems.  I spent 25 minutes counseling the patient face to face.  The total time spent in the appointment was 30 minutes.  Minette Headland, [place] [phone] 08/11/2013

## 2013-08-11 NOTE — Patient Instructions (Signed)
Carboplatin injection  What is this medicine?  CARBOPLATIN (KAR boe pla tin) is a chemotherapy drug. It targets fast dividing cells, like cancer cells, and causes these cells to die. This medicine is used to treat ovarian cancer and many other cancers.  This medicine may be used for other purposes; ask your health care provider or pharmacist if you have questions.  COMMON BRAND NAME(S): Paraplatin  What should I tell my health care provider before I take this medicine?  They need to know if you have any of these conditions:  -blood disorders  -hearing problems  -kidney disease  -recent or ongoing radiation therapy  -an unusual or allergic reaction to carboplatin, cisplatin, other chemotherapy, other medicines, foods, dyes, or preservatives  -pregnant or trying to get pregnant  -breast-feeding  How should I use this medicine?  This drug is usually given as an infusion into a vein. It is administered in a hospital or clinic by a specially trained health care professional.  Talk to your pediatrician regarding the use of this medicine in children. Special care may be needed.  Overdosage: If you think you have taken too much of this medicine contact a poison control center or emergency room at once.  NOTE: This medicine is only for you. Do not share this medicine with others.  What if I miss a dose?  It is important not to miss a dose. Call your doctor or health care professional if you are unable to keep an appointment.  What may interact with this medicine?  -medicines for seizures  -medicines to increase blood counts like filgrastim, pegfilgrastim, sargramostim  -some antibiotics like amikacin, gentamicin, neomycin, streptomycin, tobramycin  -vaccines  Talk to your doctor or health care professional before taking any of these medicines:  -acetaminophen  -aspirin  -ibuprofen  -ketoprofen  -naproxen  This list may not describe all possible interactions. Give your health care provider a list of all the medicines, herbs,  non-prescription drugs, or dietary supplements you use. Also tell them if you smoke, drink alcohol, or use illegal drugs. Some items may interact with your medicine.  What should I watch for while using this medicine?  Your condition will be monitored carefully while you are receiving this medicine. You will need important blood work done while you are taking this medicine.  This drug may make you feel generally unwell. This is not uncommon, as chemotherapy can affect healthy cells as well as cancer cells. Report any side effects. Continue your course of treatment even though you feel ill unless your doctor tells you to stop.  In some cases, you may be given additional medicines to help with side effects. Follow all directions for their use.  Call your doctor or health care professional for advice if you get a fever, chills or sore throat, or other symptoms of a cold or flu. Do not treat yourself. This drug decreases your body's ability to fight infections. Try to avoid being around people who are sick.  This medicine may increase your risk to bruise or bleed. Call your doctor or health care professional if you notice any unusual bleeding.  Be careful brushing and flossing your teeth or using a toothpick because you may get an infection or bleed more easily. If you have any dental work done, tell your dentist you are receiving this medicine.  Avoid taking products that contain aspirin, acetaminophen, ibuprofen, naproxen, or ketoprofen unless instructed by your doctor. These medicines may hide a fever.  Do not become   pregnant while taking this medicine. Women should inform their doctor if they wish to become pregnant or think they might be pregnant. There is a potential for serious side effects to an unborn child. Talk to your health care professional or pharmacist for more information. Do not breast-feed an infant while taking this medicine.  What side effects may I notice from receiving this medicine?  Side effects  that you should report to your doctor or health care professional as soon as possible:  -allergic reactions like skin rash, itching or hives, swelling of the face, lips, or tongue  -signs of infection - fever or chills, cough, sore throat, pain or difficulty passing urine  -signs of decreased platelets or bleeding - bruising, pinpoint red spots on the skin, black, tarry stools, nosebleeds  -signs of decreased red blood cells - unusually weak or tired, fainting spells, lightheadedness  -breathing problems  -changes in hearing  -changes in vision  -chest pain  -high blood pressure  -low blood counts - This drug may decrease the number of white blood cells, red blood cells and platelets. You may be at increased risk for infections and bleeding.  -nausea and vomiting  -pain, swelling, redness or irritation at the injection site  -pain, tingling, numbness in the hands or feet  -problems with balance, talking, walking  -trouble passing urine or change in the amount of urine  Side effects that usually do not require medical attention (report to your doctor or health care professional if they continue or are bothersome):  -hair loss  -loss of appetite  -metallic taste in the mouth or changes in taste  This list may not describe all possible side effects. Call your doctor for medical advice about side effects. You may report side effects to FDA at 1-800-FDA-1088.  Where should I keep my medicine?  This drug is given in a hospital or clinic and will not be stored at home.  NOTE: This sheet is a summary. It may not cover all possible information. If you have questions about this medicine, talk to your doctor, pharmacist, or health care provider.   2014, Elsevier/Gold Standard. (2007-06-08 14:38:05)  Gemcitabine injection  What is this medicine?  GEMCITABINE (jem SIT a been) is a chemotherapy drug. This medicine is used to treat many types of cancer like breast cancer, lung cancer, pancreatic cancer, and ovarian cancer.  This  medicine may be used for other purposes; ask your health care provider or pharmacist if you have questions.  COMMON BRAND NAME(S): Gemzar  What should I tell my health care provider before I take this medicine?  They need to know if you have any of these conditions:  -blood disorders  -infection  -kidney disease  -liver disease  -recent or ongoing radiation therapy  -an unusual or allergic reaction to gemcitabine, other chemotherapy, other medicines, foods, dyes, or preservatives  -pregnant or trying to get pregnant  -breast-feeding  How should I use this medicine?  This drug is given as an infusion into a vein. It is administered in a hospital or clinic by a specially trained health care professional.  Talk to your pediatrician regarding the use of this medicine in children. Special care may be needed.  Overdosage: If you think you have taken too much of this medicine contact a poison control center or emergency room at once.  NOTE: This medicine is only for you. Do not share this medicine with others.  What if I miss a dose?  It is important   not to miss your dose. Call your doctor or health care professional if you are unable to keep an appointment.  What may interact with this medicine?  -medicines to increase blood counts like filgrastim, pegfilgrastim, sargramostim  -some other chemotherapy drugs like cisplatin  -vaccines  Talk to your doctor or health care professional before taking any of these medicines:  -acetaminophen  -aspirin  -ibuprofen  -ketoprofen  -naproxen  This list may not describe all possible interactions. Give your health care provider a list of all the medicines, herbs, non-prescription drugs, or dietary supplements you use. Also tell them if you smoke, drink alcohol, or use illegal drugs. Some items may interact with your medicine.  What should I watch for while using this medicine?  Visit your doctor for checks on your progress. This drug may make you feel generally unwell. This is not  uncommon, as chemotherapy can affect healthy cells as well as cancer cells. Report any side effects. Continue your course of treatment even though you feel ill unless your doctor tells you to stop.  In some cases, you may be given additional medicines to help with side effects. Follow all directions for their use.  Call your doctor or health care professional for advice if you get a fever, chills or sore throat, or other symptoms of a cold or flu. Do not treat yourself. This drug decreases your body's ability to fight infections. Try to avoid being around people who are sick.  This medicine may increase your risk to bruise or bleed. Call your doctor or health care professional if you notice any unusual bleeding.  Be careful brushing and flossing your teeth or using a toothpick because you may get an infection or bleed more easily. If you have any dental work done, tell your dentist you are receiving this medicine.  Avoid taking products that contain aspirin, acetaminophen, ibuprofen, naproxen, or ketoprofen unless instructed by your doctor. These medicines may hide a fever.  Women should inform their doctor if they wish to become pregnant or think they might be pregnant. There is a potential for serious side effects to an unborn child. Talk to your health care professional or pharmacist for more information. Do not breast-feed an infant while taking this medicine.  What side effects may I notice from receiving this medicine?  Side effects that you should report to your doctor or health care professional as soon as possible:  -allergic reactions like skin rash, itching or hives, swelling of the face, lips, or tongue  -low blood counts - this medicine may decrease the number of white blood cells, red blood cells and platelets. You may be at increased risk for infections and bleeding.  -signs of infection - fever or chills, cough, sore throat, pain or difficulty passing urine  -signs of decreased platelets or bleeding  - bruising, pinpoint red spots on the skin, black, tarry stools, blood in the urine  -signs of decreased red blood cells - unusually weak or tired, fainting spells, lightheadedness  -breathing problems  -chest pain  -mouth sores  -nausea and vomiting  -pain, swelling, redness at site where injected  -pain, tingling, numbness in the hands or feet  -stomach pain  -swelling of ankles, feet, hands  -unusual bleeding  Side effects that usually do not require medical attention (report to your doctor or health care professional if they continue or are bothersome):  -constipation  -diarrhea  -hair loss  -loss of appetite  -stomach upset  This list may not   describe all possible side effects. Call your doctor for medical advice about side effects. You may report side effects to FDA at 1-800-FDA-1088.  Where should I keep my medicine?  This drug is given in a hospital or clinic and will not be stored at home.  NOTE: This sheet is a summary. It may not cover all possible information. If you have questions about this medicine, talk to your doctor, pharmacist, or health care provider.   2014, Elsevier/Gold Standard. (2007-07-13 18:45:54)

## 2013-08-11 NOTE — Telephone Encounter (Signed)
per pof to sch appt-sch MRI-gave pt copy of sch

## 2013-08-12 ENCOUNTER — Other Ambulatory Visit: Payer: Self-pay | Admitting: *Deleted

## 2013-08-12 ENCOUNTER — Ambulatory Visit (HOSPITAL_BASED_OUTPATIENT_CLINIC_OR_DEPARTMENT_OTHER): Payer: BC Managed Care – PPO

## 2013-08-12 ENCOUNTER — Ambulatory Visit: Payer: BC Managed Care – PPO | Admitting: *Deleted

## 2013-08-12 ENCOUNTER — Telehealth: Payer: Self-pay | Admitting: *Deleted

## 2013-08-12 VITALS — BP 156/89 | HR 115 | Temp 98.4°F | Resp 18

## 2013-08-12 DIAGNOSIS — R29898 Other symptoms and signs involving the musculoskeletal system: Secondary | ICD-10-CM

## 2013-08-12 DIAGNOSIS — C50411 Malignant neoplasm of upper-outer quadrant of right female breast: Secondary | ICD-10-CM

## 2013-08-12 DIAGNOSIS — R110 Nausea: Secondary | ICD-10-CM

## 2013-08-12 DIAGNOSIS — Z95828 Presence of other vascular implants and grafts: Secondary | ICD-10-CM

## 2013-08-12 DIAGNOSIS — C50419 Malignant neoplasm of upper-outer quadrant of unspecified female breast: Secondary | ICD-10-CM

## 2013-08-12 DIAGNOSIS — R197 Diarrhea, unspecified: Secondary | ICD-10-CM

## 2013-08-12 DIAGNOSIS — I10 Essential (primary) hypertension: Secondary | ICD-10-CM

## 2013-08-12 LAB — URINALYSIS, MICROSCOPIC - CHCC
BILIRUBIN (URINE): NEGATIVE
GLUCOSE UR CHCC: NEGATIVE mg/dL
Ketones: NEGATIVE mg/dL
NITRITE: NEGATIVE
Protein: <30 mg/dL
Specific Gravity, Urine: 1.01 (ref 1.003–1.035)
Urobilinogen, UR: 0.2 mg/dL (ref 0.2–1)
pH: 7 (ref 4.6–8.0)

## 2013-08-12 LAB — CBC WITH DIFFERENTIAL/PLATELET
BASO%: 0 % (ref 0.0–2.0)
Basophils Absolute: 0 10*3/uL (ref 0.0–0.1)
EOS%: 0.7 % (ref 0.0–7.0)
Eosinophils Absolute: 0 10*3/uL (ref 0.0–0.5)
HEMATOCRIT: 23.3 % — AB (ref 34.8–46.6)
HGB: 7.9 g/dL — ABNORMAL LOW (ref 11.6–15.9)
LYMPH#: 0.4 10*3/uL — AB (ref 0.9–3.3)
LYMPH%: 30.9 % (ref 14.0–49.7)
MCH: 31.5 pg (ref 25.1–34.0)
MCHC: 33.9 g/dL (ref 31.5–36.0)
MCV: 92.8 fL (ref 79.5–101.0)
MONO#: 0.1 10*3/uL (ref 0.1–0.9)
MONO%: 5.8 % (ref 0.0–14.0)
NEUT#: 0.9 10*3/uL — ABNORMAL LOW (ref 1.5–6.5)
NEUT%: 62.6 % (ref 38.4–76.8)
NRBC: 2 % — AB (ref 0–0)
Platelets: 71 10*3/uL — ABNORMAL LOW (ref 145–400)
RBC: 2.51 10*6/uL — AB (ref 3.70–5.45)
RDW: 17 % — ABNORMAL HIGH (ref 11.2–14.5)
WBC: 1.4 10*3/uL — ABNORMAL LOW (ref 3.9–10.3)

## 2013-08-12 MED ORDER — HEPARIN SOD (PORK) LOCK FLUSH 100 UNIT/ML IV SOLN
500.0000 [IU] | Freq: Once | INTRAVENOUS | Status: AC
  Administered 2013-08-12: 500 [IU] via INTRAVENOUS
  Filled 2013-08-12: qty 5

## 2013-08-12 MED ORDER — SODIUM CHLORIDE 0.9 % IV SOLN
Freq: Once | INTRAVENOUS | Status: AC
  Administered 2013-08-12: 14:00:00 via INTRAVENOUS

## 2013-08-12 MED ORDER — HEPARIN SOD (PORK) LOCK FLUSH 100 UNIT/ML IV SOLN
500.0000 [IU] | Freq: Once | INTRAVENOUS | Status: AC
Start: 2013-08-12 — End: 2013-08-12
  Administered 2013-08-12: 500 [IU] via INTRAVENOUS
  Filled 2013-08-12: qty 5

## 2013-08-12 MED ORDER — SODIUM CHLORIDE 0.9 % IJ SOLN
10.0000 mL | INTRAMUSCULAR | Status: DC | PRN
  Filled 2013-08-12: qty 10

## 2013-08-12 MED ORDER — SODIUM CHLORIDE 0.9 % IJ SOLN
10.0000 mL | INTRAMUSCULAR | Status: DC | PRN
  Administered 2013-08-12 (×2): 10 mL via INTRAVENOUS
  Filled 2013-08-12: qty 10

## 2013-08-12 NOTE — Progress Notes (Signed)
1306 

## 2013-08-12 NOTE — Progress Notes (Signed)
Patient came in c/o of weakness, appt was made by desk nurse.. Labs drawn via PAC, Called and informed Dr.Magrinats nurse patient was here per request. Val Dodd,RN transferred patient to infusion room for fluids.

## 2013-08-12 NOTE — Patient Instructions (Signed)

## 2013-08-12 NOTE — Patient Instructions (Signed)
Dehydration, Adult Dehydration is when you lose more fluids from the body than you take in. Vital organs like the kidneys, brain, and heart cannot function without a proper amount of fluids and salt. Any loss of fluids from the body can cause dehydration.  CAUSES   Vomiting.  Diarrhea.  Excessive sweating.  Excessive urine output.  Fever. SYMPTOMS  Mild dehydration  Thirst.  Dry lips.  Slightly dry mouth. Moderate dehydration  Very dry mouth.  Sunken eyes.  Skin does not bounce back quickly when lightly pinched and released.  Dark urine and decreased urine production.  Decreased tear production.  Headache. Severe dehydration  Very dry mouth.  Extreme thirst.  Rapid, weak pulse (more than 100 beats per minute at rest).  Cold hands and feet.  Not able to sweat in spite of heat and temperature.  Rapid breathing.  Blue lips.  Confusion and lethargy.  Difficulty being awakened.  Minimal urine production.  No tears. DIAGNOSIS  Your caregiver will diagnose dehydration based on your symptoms and your exam. Blood and urine tests will help confirm the diagnosis. The diagnostic evaluation should also identify the cause of dehydration. TREATMENT  Treatment of mild or moderate dehydration can often be done at home by increasing the amount of fluids that you drink. It is best to drink small amounts of fluid more often. Drinking too much at one time can make vomiting worse. Refer to the home care instructions below. Severe dehydration needs to be treated at the hospital where you will probably be given intravenous (IV) fluids that contain water and electrolytes. HOME CARE INSTRUCTIONS   Ask your caregiver about specific rehydration instructions.  Drink enough fluids to keep your urine clear or pale yellow.  Drink small amounts frequently if you have nausea and vomiting.  Eat as you normally do.  Avoid:  Foods or drinks high in sugar.  Carbonated  drinks.  Juice.  Extremely hot or cold fluids.  Drinks with caffeine.  Fatty, greasy foods.  Alcohol.  Tobacco.  Overeating.  Gelatin desserts.  Wash your hands well to avoid spreading bacteria and viruses.  Only take over-the-counter or prescription medicines for pain, discomfort, or fever as directed by your caregiver.  Ask your caregiver if you should continue all prescribed and over-the-counter medicines.  Keep all follow-up appointments with your caregiver. SEEK MEDICAL CARE IF:  You have abdominal pain and it increases or stays in one area (localizes).  You have a rash, stiff neck, or severe headache.  You are irritable, sleepy, or difficult to awaken.  You are weak, dizzy, or extremely thirsty. SEEK IMMEDIATE MEDICAL CARE IF:   You are unable to keep fluids down or you get worse despite treatment.  You have frequent episodes of vomiting or diarrhea.  You have blood or green matter (bile) in your vomit.  You have blood in your stool or your stool looks black and tarry.  You have not urinated in 6 to 8 hours, or you have only urinated a small amount of very dark urine.  You have a fever.  You faint. MAKE SURE YOU:   Understand these instructions.  Will watch your condition.  Will get help right away if you are not doing well or get worse. Document Released: 03/03/2005 Document Revised: 05/26/2011 Document Reviewed: 10/21/2010 Exit[employer] Patient Information 2014 [employer].  

## 2013-08-12 NOTE — Progress Notes (Signed)
This RN spoke with pt per her message stating onset this am of " my legs feel very trembly like they do not want to hold me up ".  Per discussion Sydney Ford states she was seen yesterday and treatment was held " because of my blood count and the tingling in my fingers and toes "  During the night- Sydney Ford states she " ran a fever of 99.5 and had chills and hot flashes off and on ".  She states fluid intake of 4 -16 ounce bottles of water a day.  Plan per above is for pt to come in for fever work up with orthostatic vital signs for possible need for IVF.

## 2013-08-13 LAB — URINE CULTURE

## 2013-08-16 ENCOUNTER — Ambulatory Visit (HOSPITAL_COMMUNITY)
Admission: RE | Admit: 2013-08-16 | Discharge: 2013-08-16 | Disposition: A | Discharge status: Home / Self Care | Payer: BC Managed Care – PPO | Source: Ambulatory Visit | Attending: Adult Health | Admitting: Adult Health

## 2013-08-16 DIAGNOSIS — C50411 Malignant neoplasm of upper-outer quadrant of right female breast: Secondary | ICD-10-CM

## 2013-08-16 DIAGNOSIS — C50419 Malignant neoplasm of upper-outer quadrant of unspecified female breast: Secondary | ICD-10-CM | POA: Insufficient documentation

## 2013-08-16 MED ORDER — GADOBENATE DIMEGLUMINE 529 MG/ML IV SOLN
20.0000 mL | Freq: Once | INTRAVENOUS | Status: AC | PRN
  Administered 2013-08-16: 20 mL via INTRAVENOUS

## 2013-08-18 ENCOUNTER — Ambulatory Visit (HOSPITAL_BASED_OUTPATIENT_CLINIC_OR_DEPARTMENT_OTHER): Payer: BC Managed Care – PPO

## 2013-08-18 ENCOUNTER — Ambulatory Visit (HOSPITAL_BASED_OUTPATIENT_CLINIC_OR_DEPARTMENT_OTHER): Payer: BC Managed Care – PPO | Admitting: Physician Assistant

## 2013-08-18 ENCOUNTER — Encounter: Payer: Self-pay | Admitting: Physician Assistant

## 2013-08-18 ENCOUNTER — Telehealth (INDEPENDENT_AMBULATORY_CARE_PROVIDER_SITE_OTHER): Payer: Self-pay

## 2013-08-18 ENCOUNTER — Telehealth: Payer: Self-pay | Admitting: Physician Assistant

## 2013-08-18 ENCOUNTER — Ambulatory Visit (HOSPITAL_COMMUNITY)
Admission: RE | Admit: 2013-08-18 | Discharge: 2013-08-18 | Disposition: A | Discharge status: Home / Self Care | Payer: BC Managed Care – PPO | Source: Ambulatory Visit | Attending: Oncology | Admitting: Oncology

## 2013-08-18 ENCOUNTER — Other Ambulatory Visit (HOSPITAL_BASED_OUTPATIENT_CLINIC_OR_DEPARTMENT_OTHER): Payer: BC Managed Care – PPO

## 2013-08-18 VITALS — BP 143/79 | HR 84 | Temp 98.2°F | Resp 16

## 2013-08-18 VITALS — BP 159/82 | HR 112 | Temp 98.8°F | Resp 18 | Ht 62.0 in | Wt 214.1 lb

## 2013-08-18 DIAGNOSIS — D649 Anemia, unspecified: Secondary | ICD-10-CM | POA: Insufficient documentation

## 2013-08-18 DIAGNOSIS — N200 Calculus of kidney: Secondary | ICD-10-CM

## 2013-08-18 DIAGNOSIS — T451X5A Adverse effect of antineoplastic and immunosuppressive drugs, initial encounter: Secondary | ICD-10-CM

## 2013-08-18 DIAGNOSIS — R21 Rash and other nonspecific skin eruption: Secondary | ICD-10-CM

## 2013-08-18 DIAGNOSIS — D701 Agranulocytosis secondary to cancer chemotherapy: Secondary | ICD-10-CM | POA: Insufficient documentation

## 2013-08-18 DIAGNOSIS — E876 Hypokalemia: Secondary | ICD-10-CM

## 2013-08-18 DIAGNOSIS — Z452 Encounter for adjustment and management of vascular access device: Secondary | ICD-10-CM

## 2013-08-18 DIAGNOSIS — D702 Other drug-induced agranulocytosis: Secondary | ICD-10-CM

## 2013-08-18 DIAGNOSIS — D6481 Anemia due to antineoplastic chemotherapy: Secondary | ICD-10-CM

## 2013-08-18 DIAGNOSIS — C50411 Malignant neoplasm of upper-outer quadrant of right female breast: Secondary | ICD-10-CM

## 2013-08-18 DIAGNOSIS — Z171 Estrogen receptor negative status [ER-]: Secondary | ICD-10-CM

## 2013-08-18 DIAGNOSIS — C50419 Malignant neoplasm of upper-outer quadrant of unspecified female breast: Secondary | ICD-10-CM

## 2013-08-18 DIAGNOSIS — G620 Drug-induced polyneuropathy: Secondary | ICD-10-CM

## 2013-08-18 DIAGNOSIS — G609 Hereditary and idiopathic neuropathy, unspecified: Secondary | ICD-10-CM

## 2013-08-18 LAB — CBC WITH DIFFERENTIAL/PLATELET
BASO%: 0.9 % (ref 0.0–2.0)
BASOS ABS: 0 10*3/uL (ref 0.0–0.1)
EOS ABS: 0 10*3/uL (ref 0.0–0.5)
EOS%: 1.8 % (ref 0.0–7.0)
HEMATOCRIT: 24.1 % — AB (ref 34.8–46.6)
HEMOGLOBIN: 7.8 g/dL — AB (ref 11.6–15.9)
LYMPH#: 0.4 10*3/uL — AB (ref 0.9–3.3)
LYMPH%: 34.5 % (ref 14.0–49.7)
MCH: 31.7 pg (ref 25.1–34.0)
MCHC: 32.4 g/dL (ref 31.5–36.0)
MCV: 98 fL (ref 79.5–101.0)
MONO#: 0.2 10*3/uL (ref 0.1–0.9)
MONO%: 19.1 % — ABNORMAL HIGH (ref 0.0–14.0)
NEUT#: 0.5 10*3/uL — CL (ref 1.5–6.5)
NEUT%: 43.7 % (ref 38.4–76.8)
Platelets: 211 10*3/uL (ref 145–400)
RBC: 2.46 10*6/uL — ABNORMAL LOW (ref 3.70–5.45)
RDW: 20 % — ABNORMAL HIGH (ref 11.2–14.5)
WBC: 1.1 10*3/uL — AB (ref 3.9–10.3)
nRBC: 3 % — ABNORMAL HIGH (ref 0–0)

## 2013-08-18 LAB — COMPREHENSIVE METABOLIC PANEL (CC13)
ALBUMIN: 3.4 g/dL — AB (ref 3.5–5.0)
ALT: 37 U/L (ref 0–55)
ANION GAP: 15 meq/L — AB (ref 3–11)
AST: 23 U/L (ref 5–34)
Alkaline Phosphatase: 81 U/L (ref 40–150)
BUN: 9.6 mg/dL (ref 7.0–26.0)
CALCIUM: 8.9 mg/dL (ref 8.4–10.4)
CHLORIDE: 108 meq/L (ref 98–109)
CO2: 21 meq/L — AB (ref 22–29)
CREATININE: 1.1 mg/dL (ref 0.6–1.1)
GLUCOSE: 143 mg/dL — AB (ref 70–140)
POTASSIUM: 3.4 meq/L — AB (ref 3.5–5.1)
Sodium: 144 mEq/L (ref 136–145)
Total Bilirubin: 0.75 mg/dL (ref 0.20–1.20)
Total Protein: 6.1 g/dL — ABNORMAL LOW (ref 6.4–8.3)

## 2013-08-18 LAB — PREPARE RBC (CROSSMATCH)

## 2013-08-18 LAB — ABO/RH: ABO/RH(D): A POS

## 2013-08-18 LAB — CULTURE, BLOOD (SINGLE)

## 2013-08-18 MED ORDER — DIPHENHYDRAMINE HCL 25 MG PO CAPS
25.0000 mg | ORAL_CAPSULE | Freq: Once | ORAL | Status: AC
  Administered 2013-08-18: 25 mg via ORAL

## 2013-08-18 MED ORDER — HEPARIN SOD (PORK) LOCK FLUSH 100 UNIT/ML IV SOLN
500.0000 [IU] | Freq: Every day | INTRAVENOUS | Status: AC | PRN
  Administered 2013-08-18: 500 [IU]
  Filled 2013-08-18: qty 5

## 2013-08-18 MED ORDER — ALTEPLASE 2 MG IJ SOLR
2.0000 mg | Freq: Once | INTRAMUSCULAR | Status: AC | PRN
  Administered 2013-08-18: 2 mg
  Filled 2013-08-18: qty 2

## 2013-08-18 MED ORDER — ACETAMINOPHEN 325 MG PO TABS
ORAL_TABLET | ORAL | Status: AC
  Filled 2013-08-18: qty 2

## 2013-08-18 MED ORDER — CIPROFLOXACIN HCL 500 MG PO TABS: 500.0000 mg | ORAL_TABLET | Freq: Two times a day (BID) | ORAL | Status: DC

## 2013-08-18 MED ORDER — POTASSIUM CHLORIDE ER 10 MEQ PO CPCR: 10.0000 meq | ORAL_CAPSULE | Freq: Two times a day (BID) | ORAL | Status: DC

## 2013-08-18 MED ORDER — ACETAMINOPHEN 325 MG PO TABS
650.0000 mg | ORAL_TABLET | Freq: Once | ORAL | Status: AC
  Administered 2013-08-18: 650 mg via ORAL

## 2013-08-18 MED ORDER — PEGFILGRASTIM INJECTION 6 MG/0.6ML
6.0000 mg | Freq: Once | SUBCUTANEOUS | Status: AC
  Administered 2013-08-18: 6 mg via SUBCUTANEOUS
  Filled 2013-08-18: qty 0.6

## 2013-08-18 MED ORDER — DIPHENHYDRAMINE HCL 25 MG PO CAPS
ORAL_CAPSULE | ORAL | Status: AC
Start: 2013-08-18 — End: 2013-08-18
  Filled 2013-08-18: qty 1

## 2013-08-18 MED ORDER — SODIUM CHLORIDE 0.9 % IV SOLN
250.0000 mL | Freq: Once | INTRAVENOUS | Status: AC
  Administered 2013-08-18: 250 mL via INTRAVENOUS

## 2013-08-18 MED ORDER — SODIUM CHLORIDE 0.9 % IJ SOLN
10.0000 mL | INTRAMUSCULAR | Status: AC | PRN
  Administered 2013-08-18: 10 mL
  Filled 2013-08-18: qty 10

## 2013-08-18 NOTE — Telephone Encounter (Signed)
, °

## 2013-08-18 NOTE — Progress Notes (Addendum)
[place]  [phone] [phone]     ID: KASLYN RICHBURG OB: 11-Dec-1960  MR#: [id number]  DUP#:[id number]  PCP: Dwan Bolt, MD GYN:   SU: Rolm Bookbinder, MD OTHER MD: Ulyess Blossom, MD;  Franchot Gallo, MD  CHIEF COMPLAINT:  Right breast cancer/neoadjuvant chemotherapy   BREAST CANCER HISTORY: As per previously documented note:  Francene Castle (pronounced "line 'em") herself noted a change in her right breast sometime October 2014 but "my breasts are like bean bags" and she initially ignored it. After while she began to feel a pole when she lay on her right side at night so she scheduled herself for screening mammography at the [facility] 02/08/2013 (prior mammogram had been April 2011), and this did show a possible mass in the right breast. Unilateral right diagnostic mammography and ultrasonography 02/28/2013 confirmed an irregular mass in the right breast upper outer quadrant measuring 2.4 cm. This was palpable at the 11:00 position. Ultrasound showed an irregular hypoechoic mass measuring 1.5 cm with no abnormal adenopathy in the right axilla.  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  The patient met with Dr. Donne Hazel 03/14/2013 and he recommended primary systemic chemotherapy to decrease the size of the tumor and increase the chance of lumpectomy.   Subsequent history is as detailed below.  INTERVAL HISTORY: [name] returns alone today for followup of her locally advanced right breast cancer. She continues to receive neoadjuvant chemotherapy, and is due for her 9th of 12 planned weekly doses of carboplatin/paclitaxel today.   As a brief recap, [name] has had several delays over the last few months, with recurrent kidney stones,  neutropenia, and thrombocytopenia. She received her 4 dose dense cycles of doxorubicin/cyclophosphamide, and in late March began her weekly carboplatin/paclitaxel. She received her eighth weekly dose 2 weeks ago, but received paclitaxel alone, the carboplatin held secondary to thrombocytopenia. Treatment was held completely one week ago do to pancytopenia, with an ANC of 0.9, hemoglobin 7.9, and platelets of 71,000. She is here today for repeat labs, followup, and consideration of her ninth weekly carboplatin/paclitaxel.  [name] tells me it has been a hard week, and she is extremely tired. Unfortunately, her hemoglobin has dropped even further, down to 7.8 today. She denies any signs of abnormal bleeding. She is having weakness and fatigue, in addition to her shortness of breath with even minor exertion. Her [place] has also dropped further, from 0.9 last week to 0.5 today. Fortunately, she denies any fevers or chills. Her platelets, however, has finally recovered and are back to normal at 211,000 today.  Interval history is notable for the fact that [name] had a repeat breast MRI earlier this week on 08/16/2013 which showed a slight further treatment response was slightly decreased minimal residual non-masslike enhancement. This is detailed further below.  [name] continues to have intermittent flank pain associated with her recurrent kidney stones, and this has not worsened or changed. She is able to urinate normally, and denies any dysuria or hematuria.  She continues to be followed by urology as needed.   REVIEW OF SYSTEMS: [name] denies any fevers or chills, although she does have some hot flashes and night sweats. 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 She finds that taking tramadol at night helps disease that  discomfort, and helps her sleep. Nonetheless, she is extremely tired during the day as noted above. She's had no new rashes or skin changes but notes  that her face itches. She is not aware of any new medications, new foods, or new products she has been using. Benadryl does help somewhat. She's had no mouth ulcers or oral sensitivity. She has persistent queasiness, but no emesis. She's had no change in bowel habits. She denies any cough or phlegm production. She's had no chest pain or palpitations. She denies any significant peripheral swelling. She's had no abnormal headaches or dizziness. She does have blurred vision which seems a little worse. She currently denies any unusual myalgias, arthralgias, or bony pain, but does admit to having some occasional cramps in her legs.  A detailed review of systems is otherwise stable and noncontributory.    PAST MEDICAL HISTORY: Past Medical History  Diagnosis Date  . Hypertension   . Hyperlipidemia   . Thyroid disease   . Osteoporosis   . Hypothyroidism     hashimoto's  . Depression   . Pneumonia     2014  . Cold (disease) 03/21/12    couple of weeks ago - pt states much better now  . History of kidney stones     "passed 9 stones"  . GERD (gastroesophageal reflux disease)     no meds except occas tums  . Headache(784.0)     occas migraine  . Cancer     new dx of right breast cancer - planning chemo first  . Arthritis     oa; lower back pain - hx of injury yrs ago-occas flare ups  . Anemia     2005 - related to heavy menses with the thyroid probl  . Chronic kidney disease     kidney stone    PAST SURGICAL HISTORY:  (Updated 07/07/2013) Past Surgical History  Procedure Laterality Date  . Cesarean section  1987  . Tonsillectomy    . Uterine ablation  2008    no menstrual periods since procedure  . Portacath placement N/A 03/23/2013    Procedure: INSERTION PORT-A-CATH;  Surgeon: Rolm Bookbinder, MD;  Location: [facility];  Service: General;  Laterality: N/A;  . Cystoscopy with retrograde pyelogram, ureteroscopy and stent placement Right 06/30/2013    Procedure: CYSTOSCOPY WITH RETROGRADE  PYELOGRAM, URETEROSCOPY, STONE EXTRACTION AND  STENT PLACEMENT;  Surgeon: Franchot Gallo, MD;  Location: [facility];  Service: Urology;  Laterality: Right;  . Holmium laser application Right 7/35/3299    Procedure: HOLMIUM LASER APPLICATION;  Surgeon: Franchot Gallo, MD;  Location: [facility];  Service: Urology;  Laterality: Right;    FAMILY HISTORY Family History  Problem Relation Age of Onset  . Heart failure Mother   . Heart attack Father   . Autoimmune disease Maternal Aunt     lupus, scelraderma, reynolds disease, and others  . Stroke Paternal Uncle   . Heart failure Maternal Grandmother   . Heart attack Maternal Grandfather   . Throat cancer Paternal Grandmother     dx late 69s; non smoker or drinker  . Heart attack Paternal Grandfather   . Schizophrenia Son 20  . Heart attack Paternal Uncle    the patient's father died at the age of 25 from a myocardial infarction. The patient's mother died at the age of 28 with congestive heart failure. She also had a history of lupus. The patient had no brothers or sisters. There is no history of breast or ovarian cancer in  the family to her knowledge.  GYNECOLOGIC HISTORY:   (reviewed on 08/18/2013)  Menarche age 57, first live birth age 58. She is GX P2. She underwent endometrial ablation in 2009 and has not had a period since that time. She never took hormone replacement. She did use birth control pills remotely with no complications  SOCIAL HISTORY: (Reviewd 08/18/2013)  [name] works at [employer] for a variety of long-term clients. She does not work for a company. Her husband [name] is semiretired. He works for a company that [employer] data on [place]. Son [name] is in [place] and works in Architect. Son [name] lives at home and is disabled with a diagnosis of paranoid schizophrenia. The patient has no grandchildren. She is not a church attender    ADVANCED DIRECTIVES: Not in place   HEALTH MAINTENANCE: (  reviewed 08/18/2013 ) History  Substance Use Topics  . Smoking status: Former Smoker    Quit date: 03/17/1985  . Smokeless tobacco: Never Used  . Alcohol Use: No     Colonoscopy: Never  PAP: Remote  Bone density: November 2014, showed osteoporosis   according to the patient's report  Lipid panel:  Not on file, Dr. Wilson Singer   Allergies  Allergen Reactions  . Codeine Itching and Nausea Only    REACTION: Nausea and itching  . Compazine [Prochlorperazine Edisylate] Other (See Comments)    Head aches   . Other     Close contact with Some recycled plastics cause whelps  . Tape Rash    Needs OPSITE dressing with port access    Current Outpatient Prescriptions  Medication Sig Dispense Refill  . atorvastatin (LIPITOR) 20 MG tablet Take 20 mg by mouth every evening.       . lidocaine-prilocaine (EMLA) cream Apply 1 application topically daily as needed. port      . metoCLOPramide (REGLAN) 5 MG tablet Take 1 tablet (5 mg total) by mouth 3 (three) times daily before meals.  90 tablet  4  . omeprazole (PRILOSEC) 40 MG capsule Take 1 capsule (40 mg total) by mouth daily.  30 capsule  4  . oxybutynin (DITROPAN) 5 MG tablet Take 1 tablet (5 mg total) by mouth 3 (three) times daily.  30 tablet  1  . PRESCRIPTION MEDICATION Chemotherapy at [facility]      . sertraline (ZOLOFT) 100 MG tablet TAKE ONE TABLET BY MOUTH AT BEDTIME  30 tablet  0  . tamsulosin (FLOMAX) 0.4 MG CAPS capsule       . thyroid (ARMOUR) 90 MG tablet Take 90 mg by mouth every morning.      . traMADol (ULTRAM) 50 MG tablet Take 1-2 tablets (50-100 mg total) by mouth 3 (three) times daily as needed for moderate pain or severe pain.  100 tablet  0  . traZODone (DESYREL) 50 MG tablet Take 1 tablet (50 mg total) by mouth at bedtime as needed for sleep.  30 tablet  1  . ciprofloxacin (CIPRO) 500 MG tablet Take 1 tablet (500 mg total) by mouth 2 (two) times daily. X 7 days  14 tablet  0  . dexamethasone (DECADRON) 4 MG tablet TAKE 2 TABLETS  BY MOUTH ONCE A DAY ON THE DAY AFTER CHEMOTHERAPY AND THEN TAKE 2 TABLETS TWICE DAILY FOR 2 DAYS WITH FOOD.  60 tablet  0  . LORazepam (ATIVAN) 0.5 MG tablet Take 0.5 mg by mouth every 8 (eight) hours as needed (nausea).       Marland Kitchen  nystatin (MYCOSTATIN) 100000 UNIT/ML suspension Take 5 mLs (500,000 Units total) by mouth 4 (four) times daily.  180 mL  2  . ondansetron (ZOFRAN) 8 MG tablet Take 1 tablet (8 mg total) by mouth every 8 (eight) hours as needed for nausea or vomiting.  20 tablet  1  . potassium chloride (MICRO-K) 10 MEQ CR capsule Take 1 capsule (10 mEq total) by mouth 2 (two) times daily.  60 capsule  1  . promethazine (PHENERGAN) 25 MG suppository Place 1 suppository (25 mg total) rectally every 8 (eight) hours as needed for nausea or vomiting.  12 each  0   No current facility-administered medications for this visit.   Facility-Administered Medications Ordered in Other Visits  Medication Dose Route Frequency Provider Last Rate Last Dose  . alteplase (CATHFLO ACTIVASE) injection 2 mg  2 mg Intracatheter Once PRN Chauncey Cruel, MD        PHYSICAL EXAM: Middle-aged Caucasian female who appears very weak and tired but is in no acute distress    Filed Vitals:   08/18/13 1037  BP: 159/82  Pulse: 112  Temp: 98.8 F (37.1 C)  Resp: 18     Body mass index is 39.15 kg/(m^2).    ECOG FS: 1 Filed Weights   08/18/13 1037  Weight: 214 lb 1.6 oz (97.115 kg)   Physical Exam: HEENT:  Sclerae anicteric. Conjunctivae pale. Oropharynx is moist. Buccal mucosa is pale. No ulcerations or evidence of mucositis. No evidence of oropharyngeal candidiasis. Neck is supple, trachea midline.  NODES:  No cervical or supraclavicular lymphadenopathy palpated.  BREAST EXAM: Unable to palpate a distinct mass in the right breast today. Left breast is unremarkable. Axillae are benign bilaterally, no palpable lymphadenopathy. LUNGS:  Clear to auscultation bilaterally.  No wheezes or rhonchi HEART:  Regular  rhythm. Mildly tachycardic. ABDOMEN:  Soft, obese, nontender.  Positive bowel sounds.  MSK:  No focal spinal tenderness to palpation. Full range of motion bilaterally in the upper extremities. No CVA tenderness. EXTREMITIES:  No peripheral edema.  No clubbing or cyanosis. SKIN:  The face is slightly erythematous, especially the 4 head, but there is no actual rash, and no evidence of vesicles, pustules, cracking, or peeling. No additional rashes or skin changes are noted. The patient has several large ecchymoses bilaterally on the forearms, status post recent IV access for her MRI. There is obvious pallor on exam. Port is intact in the chest wall with no erythema or edema, and no evidence of infection/cellulitis. NEURO:  Nonfocal. Well oriented. Fatigued but appropriate affect.  STUDIES: Mr Breast Bilateral W Wo Contrast  08/17/2013   CLINICAL DATA:  58 year old female with known right breast cancer. Evaluate response to neoadjuvant chemotherapy.  LABS:  None obtained today.  EXAM: BILATERAL BREAST MRI WITH AND WITHOUT CONTRAST  TECHNIQUE: Multiplanar, multisequence MR images of both breasts were obtained prior to and following the intravenous administration of 1ml of MultiHance.  THREE-DIMENSIONAL MR IMAGE RENDERING ON INDEPENDENT WORKSTATION:  Three-dimensional MR images were rendered by post-processing of the original MR data on an independent workstation. The three-dimensional MR images were interpreted, and findings are reported in the following complete MRI report for this study. Three dimensional images were evaluated at the independent DynaCad workstation  COMPARISON:  05/17/2013 and 03/08/2013 breast MRs. Prior mammograms.  FINDINGS: Breast composition: b.  Scattered fibroglandular tissue.  Background parenchymal enhancement: Mild  Right breast: Minimal residual non masslike enhancement within the upper-outer right breast is identified in the area of  known neoplasm and has slightly decreased since  05/17/2013.  No other abnormal enhancement within the right breast identified.  Left breast: No mass or abnormal enhancement.  Lymph nodes: No abnormal appearing lymph nodes.  Ancillary findings:  None.  IMPRESSION: Slight further treatment response with slightly decreased minimal residual non masslike enhancement within the upper-outer right breast, in the area of biopsy-proven neoplasm.  No MR evidence of left breast neoplasm or abnormal lymph nodes.  RECOMMENDATION: Treatment plan  BI-RADS CATEGORY  6: Known biopsy-proven malignancy.   Electronically Signed   By: Hassan Rowan M.D.   On: 08/17/2013 14:27    LAB RESULTS:  Lab Results  Component Value Date   WBC 1.1* 08/18/2013   NEUTROABS 0.5* 08/18/2013   HGB 7.8* 08/18/2013   HCT 24.1* 08/18/2013   MCV 98.0 08/18/2013   PLT 211 08/18/2013      Chemistry      Component Value Date/Time   NA 144 08/18/2013 1008   NA 138 06/30/2013 0747   K 3.4* 08/18/2013 1008   K 4.1 06/30/2013 0747   CL 101 06/30/2013 0747   CO2 21* 08/18/2013 1008   CO2 25 06/30/2013 0747   BUN 9.6 08/18/2013 1008   BUN 23 06/30/2013 0747   CREATININE 1.1 08/18/2013 1008   CREATININE 1.22* 06/30/2013 0747      Component Value Date/Time   CALCIUM 8.9 08/18/2013 1008   CALCIUM 8.9 06/30/2013 0747   ALKPHOS 81 08/18/2013 1008   ALKPHOS 65 05/27/2013 0530   AST 23 08/18/2013 1008   AST 7 05/27/2013 0530   ALT 37 08/18/2013 1008   ALT 11 05/27/2013 0530   BILITOT 0.75 08/18/2013 1008   BILITOT 0.5 05/27/2013 0530       ASSESSMENT: 58 y.o. 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  (2)  being treated in the neoadjuvant setting,   (a) completed 4 dose dense cycles of doxorubicin/cyclophosphamide 05/19/2013. Cycle 4 was delayed one week because of bad weather.  Neulasta on day 2 for granulocyte support.  (b) receiving 12 weekly doses of  carboplatin/paclitaxel prior to definitive surgery, first dose 06/09/2013.  (3)  recurrent nephrolithiasis - followed by Dr. Diona Fanti  (4)  thrombocytopenia, asymptomatic - likely secondary to carboplatin - resolved  (5)  rash on the hands and face, possibly secondary to paclitaxel  (6) symptomatic anemia - likely chemotherapy-induced   (7) chemotherapy-induced neutropenia  - afebrile      PLAN: Dr. Jana Hakim has reviewed this case, and  we both agree that we are going to need to discontinue pounds neoadjuvant chemotherapy at this point. Her counts are not recovering between treatments, she is having more neuropathy, and she simply is not tolerating the carboplatin/paclitaxel at this point.   [name] will proceed to  infusion today for a blood transfusion, 2 units of packed red blood cells.  We reviewed neutropenic precautions once again, and she is starting back on Cipro prophylactically, 500 mg by mouth twice a day for 7 days. We will also give her a Neulasta injection today in the hopes of boosting her immune system more quickly. She also knows to call us with any fevers of 100 or above.   I am also starting [name] on a low dose of potassium, 10 mEq twice a day for mild hypokalemia. I encouraged her to  keep herself well hydrated, and hopefully these measures will help decrease the cramping in her legs. She'll continue using oral Benadryl as needed for the itching and slight rash on her face. Hopefully now that we have discontinued the paclitaxel, that will resolve.  I will see [name] back in one week on June 11 for repeat labs and physical exam only. In the meanwhile, I have requested that she be seen by Dr. Donne Hazel as soon as possible for reassessment and discussion of definitive surgery now that she has completed neoadjuvant treatment.  All the above was reviewed in detail with [name] today, and she was also given all of the above information in writing. She voices both her understanding and agreement  with this plan, and knows to call with any changes or problems.   Theotis Burrow, PA-C    Medical oncology 08/18/2013   ADDENDUM: [name] has had a very good response to chemotherapy and at this point she will not be able to tolerate any further systemic treatment. We are referring her back to her surgeon. I am hopeful her neuropathy will continue to improve and will eventualy clear. We are continuing to follow closely until she no longer needs active support and we went ahead and gave her a dose of neulasta today. She will see me again after surgery to discuss the final pathology. She knows to call for any problems that may develop before then.  I personally saw this patient and performed a substantive portion of this encounter with the listed APP documented above.   Chauncey Cruel, MD

## 2013-08-18 NOTE — Patient Instructions (Signed)
Blood Transfusion Information WHAT IS A BLOOD TRANSFUSION? A transfusion is the replacement of blood or some of its parts. Blood is made up of multiple cells which provide different functions.  Red blood cells carry oxygen and are used for blood loss replacement.  White blood cells fight against infection.  Platelets control bleeding.  Plasma helps clot blood.  Other blood products are available for specialized needs, such as hemophilia or other clotting disorders. BEFORE THE TRANSFUSION  Who gives blood for transfusions?   You may be able to donate blood to be used at a later date on yourself (autologous donation).  Relatives can be asked to donate blood. This is generally not any safer than if you have received blood from a stranger. The same precautions are taken to ensure safety when a relative's blood is donated.  Healthy volunteers who are fully evaluated to make sure their blood is safe. This is blood bank blood. Transfusion therapy is the safest it has ever been in the practice of medicine. Before blood is taken from a donor, a complete history is taken to make sure that person has no history of diseases nor engages in risky social behavior (examples are intravenous drug use or sexual activity with multiple partners). The donor's travel history is screened to minimize risk of transmitting infections, such as malaria. The donated blood is tested for signs of infectious diseases, such as HIV and hepatitis. The blood is then tested to be sure it is compatible with you in order to minimize the chance of a transfusion reaction. If you or a relative donates blood, this is often done in anticipation of surgery and is not appropriate for emergency situations. It takes many days to process the donated blood. RISKS AND COMPLICATIONS Although transfusion therapy is very safe and saves many lives, the main dangers of transfusion include:   Getting an infectious disease.  Developing a  transfusion reaction. This is an allergic reaction to something in the blood you were given. Every precaution is taken to prevent this. The decision to have a blood transfusion has been considered carefully by your caregiver before blood is given. Blood is not given unless the benefits outweigh the risks. AFTER THE TRANSFUSION  Right after receiving a blood transfusion, you will usually feel much better and more energetic. This is especially true if your red blood cells have gotten low (anemic). The transfusion raises the level of the red blood cells which carry oxygen, and this usually causes an energy increase.  The nurse administering the transfusion will monitor you carefully for complications. HOME CARE INSTRUCTIONS  No special instructions are needed after a transfusion. You may find your energy is better. Speak with your caregiver about any limitations on activity for underlying diseases you may have. SEEK MEDICAL CARE IF:   Your condition is not improving after your transfusion.  You develop redness or irritation at the intravenous (IV) site. SEEK IMMEDIATE MEDICAL CARE IF:  Any of the following symptoms occur over the next 12 hours:  Shaking chills.  You have a temperature by mouth above 102 F (38.9 C), not controlled by medicine.  Chest, back, or muscle pain.  People around you feel you are not acting correctly or are confused.  Shortness of breath or difficulty breathing.  Dizziness and fainting.  You get a rash or develop hives.  You have a decrease in urine output.  Your urine turns a dark color or changes to pink, red, or brown. Any of the following   symptoms occur over the next 10 days:  You have a temperature by mouth above 102 F (38.9 C), not controlled by medicine.  Shortness of breath.  Weakness after normal activity.  The white part of the eye turns yellow (jaundice).  You have a decrease in the amount of urine or are urinating less often.  Your  urine turns a dark color or changes to pink, red, or brown. Document Released: 02/29/2000 Document Revised: 05/26/2011 Document Reviewed: 10/18/2007 [employer] Patient Information 2014 [employer]   Pegfilgrastim injection What is this medicine? PEGFILGRASTIM (peg fil GRA stim) helps the body make more white blood cells. It is used to prevent infection in people with low amounts of white blood cells following cancer treatment. This medicine may be used for other purposes; ask your health care provider or pharmacist if you have questions. COMMON BRAND NAME(S): Neulasta What should I tell my health care provider before I take this medicine? They need to know if you have any of these conditions: -sickle cell disease -an unusual or allergic reaction to pegfilgrastim, filgrastim, E.coli protein, other medicines, foods, dyes, or preservatives -pregnant or trying to get pregnant -breast-feeding How should I use this medicine? This medicine is for injection under the skin. It is usually given by a health care professional in a hospital or clinic setting. If you get this medicine at home, you will be taught how to prepare and give this medicine. Do not shake this medicine. Use exactly as directed. Take your medicine at regular intervals. Do not take your medicine more often than directed. It is important that you put your used needles and syringes in a special sharps container. Do not put them in a trash can. If you do not have a sharps container, call your pharmacist or healthcare provider to get one. Talk to your pediatrician regarding the use of this medicine in children. While this drug may be prescribed for children who weigh more than 45 kg for selected conditions, precautions do apply Overdosage: If you think you have taken too much of this medicine contact a poison control center or emergency room at once. NOTE: This medicine is only for you. Do not share this medicine with others. What if I  miss a dose? If you miss a dose, take it as soon as you can. If it is almost time for your next dose, take only that dose. Do not take double or extra doses. What may interact with this medicine? -lithium -medicines for growth therapy This list may not describe all possible interactions. Give your health care provider a list of all the medicines, herbs, non-prescription drugs, or dietary supplements you use. Also tell them if you smoke, drink alcohol, or use illegal drugs. Some items may interact with your medicine. What should I watch for while using this medicine? Visit your doctor for regular check ups. You will need important blood work done while you are taking this medicine. What side effects may I notice from receiving this medicine? Side effects that you should report to your doctor or health care professional as soon as possible: -allergic reactions like skin rash, itching or hives, swelling of the face, lips, or tongue -breathing problems -fever -pain, redness, or swelling where injected -shoulder pain -stomach or side pain Side effects that usually do not require medical attention (report to your doctor or health care professional if they continue or are bothersome): -aches, pains -headache -loss of appetite -nausea, vomiting -unusually tired This list may not describe all   possible side effects. Call your doctor for medical advice about side effects. You may report side effects to FDA at 1-800-FDA-1088. Where should I keep my medicine? Keep out of the reach of children. Store in a refrigerator between 2 and 8 degrees C (36 and 46 degrees F). Do not freeze. Keep in carton to protect from light. Throw away this medicine if it is left out of the refrigerator for more than 48 hours. Throw away any unused medicine after the expiration date. NOTE: This sheet is a summary. It may not cover all possible information. If you have questions about this medicine, talk to your doctor,  pharmacist, or health care provider.  2014, [employer]. (2007-10-04 15:41:44)  

## 2013-08-18 NOTE — Telephone Encounter (Signed)
Called pt to make her an appt with Dr Donne Hazel for 6/12 arrive @11 :15/11:30. The pt understands.

## 2013-08-19 LAB — TYPE AND SCREEN
ABO/RH(D): A POS
Antibody Screen: NEGATIVE
UNIT DIVISION: 0
Unit division: 0

## 2013-08-22 ENCOUNTER — Encounter: Payer: Self-pay | Admitting: Urology

## 2013-08-25 ENCOUNTER — Other Ambulatory Visit (HOSPITAL_BASED_OUTPATIENT_CLINIC_OR_DEPARTMENT_OTHER): Payer: BC Managed Care – PPO

## 2013-08-25 ENCOUNTER — Other Ambulatory Visit: Payer: Self-pay | Admitting: Oncology

## 2013-08-25 ENCOUNTER — Encounter: Payer: Self-pay | Admitting: Physician Assistant

## 2013-08-25 ENCOUNTER — Ambulatory Visit: Payer: BC Managed Care – PPO

## 2013-08-25 ENCOUNTER — Telehealth: Payer: Self-pay | Admitting: Oncology

## 2013-08-25 ENCOUNTER — Ambulatory Visit (HOSPITAL_BASED_OUTPATIENT_CLINIC_OR_DEPARTMENT_OTHER): Payer: BC Managed Care – PPO | Admitting: Physician Assistant

## 2013-08-25 VITALS — BP 167/91 | HR 76 | Temp 99.6°F | Resp 18 | Ht 62.0 in | Wt 215.1 lb

## 2013-08-25 DIAGNOSIS — R21 Rash and other nonspecific skin eruption: Secondary | ICD-10-CM

## 2013-08-25 DIAGNOSIS — N200 Calculus of kidney: Secondary | ICD-10-CM

## 2013-08-25 DIAGNOSIS — C50411 Malignant neoplasm of upper-outer quadrant of right female breast: Secondary | ICD-10-CM

## 2013-08-25 DIAGNOSIS — T451X5A Adverse effect of antineoplastic and immunosuppressive drugs, initial encounter: Secondary | ICD-10-CM

## 2013-08-25 DIAGNOSIS — D649 Anemia, unspecified: Secondary | ICD-10-CM

## 2013-08-25 DIAGNOSIS — E876 Hypokalemia: Secondary | ICD-10-CM

## 2013-08-25 DIAGNOSIS — G620 Drug-induced polyneuropathy: Secondary | ICD-10-CM

## 2013-08-25 DIAGNOSIS — Z171 Estrogen receptor negative status [ER-]: Secondary | ICD-10-CM

## 2013-08-25 DIAGNOSIS — C50419 Malignant neoplasm of upper-outer quadrant of unspecified female breast: Secondary | ICD-10-CM

## 2013-08-25 DIAGNOSIS — M1990 Unspecified osteoarthritis, unspecified site: Secondary | ICD-10-CM

## 2013-08-25 LAB — CBC WITH DIFFERENTIAL/PLATELET
BASO%: 0.6 % (ref 0.0–2.0)
BASOS ABS: 0.1 10*3/uL (ref 0.0–0.1)
EOS%: 0.1 % (ref 0.0–7.0)
Eosinophils Absolute: 0 10*3/uL (ref 0.0–0.5)
HEMATOCRIT: 32.3 % — AB (ref 34.8–46.6)
HGB: 10.5 g/dL — ABNORMAL LOW (ref 11.6–15.9)
LYMPH#: 1.1 10*3/uL (ref 0.9–3.3)
LYMPH%: 7.3 % — ABNORMAL LOW (ref 14.0–49.7)
MCH: 31.2 pg (ref 25.1–34.0)
MCHC: 32.5 g/dL (ref 31.5–36.0)
MCV: 96 fL (ref 79.5–101.0)
MONO#: 0.7 10*3/uL (ref 0.1–0.9)
MONO%: 4.5 % (ref 0.0–14.0)
NEUT#: 13.3 10*3/uL — ABNORMAL HIGH (ref 1.5–6.5)
NEUT%: 87.5 % — ABNORMAL HIGH (ref 38.4–76.8)
PLATELETS: 161 10*3/uL (ref 145–400)
RBC: 3.37 10*6/uL — ABNORMAL LOW (ref 3.70–5.45)
RDW: 20.9 % — ABNORMAL HIGH (ref 11.2–14.5)
WBC: 15.2 10*3/uL — ABNORMAL HIGH (ref 3.9–10.3)

## 2013-08-25 LAB — COMPREHENSIVE METABOLIC PANEL (CC13)
ALT: 51 U/L (ref 0–55)
ANION GAP: 11 meq/L (ref 3–11)
AST: 30 U/L (ref 5–34)
Albumin: 3.4 g/dL — ABNORMAL LOW (ref 3.5–5.0)
Alkaline Phosphatase: 182 U/L — ABNORMAL HIGH (ref 40–150)
BUN: 10.3 mg/dL (ref 7.0–26.0)
CALCIUM: 8.7 mg/dL (ref 8.4–10.4)
CHLORIDE: 104 meq/L (ref 98–109)
CO2: 30 meq/L — AB (ref 22–29)
Creatinine: 1.5 mg/dL — ABNORMAL HIGH (ref 0.6–1.1)
Glucose: 103 mg/dl (ref 70–140)
Potassium: 3.6 mEq/L (ref 3.5–5.1)
Sodium: 146 mEq/L — ABNORMAL HIGH (ref 136–145)
Total Bilirubin: 0.62 mg/dL (ref 0.20–1.20)
Total Protein: 6.1 g/dL — ABNORMAL LOW (ref 6.4–8.3)

## 2013-08-25 NOTE — Progress Notes (Signed)
[place]  Telephone:([phone] Fax:([phone]) [phone]     ID: CHI WOODHAM OB: 01/09/1956  MR#: [id number]  JOA#:[id number]  PCP: Dwan Bolt, MD GYN:   SU: Rolm Bookbinder, MD OTHER MD: Ulyess Blossom, MD;  Franchot Gallo, MD  CHIEF COMPLAINT:  Right breast cancer/neoadjuvant chemotherapy   BREAST CANCER HISTORY: As per previously documented note:  Sydney Ford (pronounced "line 'em") herself noted a change in her right breast sometime October 2014 but "my breasts are like bean bags" and she initially ignored it. After while she began to feel a pole when she lay on her right side at night so she scheduled herself for screening mammography at the [facility] 02/08/2013 (prior mammogram had been April 2011), and this did show a possible mass in the right breast. Unilateral right diagnostic mammography and ultrasonography 02/28/2013 confirmed an irregular mass in the right breast upper outer quadrant measuring 2.4 cm. This was palpable at the 11:00 position. Ultrasound showed an irregular hypoechoic mass measuring 1.5 cm with no abnormal adenopathy in the right axilla.  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  The patient met with Dr. Donne Hazel 03/14/2013 and he recommended primary systemic chemotherapy to decrease the size of the tumor and increase the chance of lumpectomy.   Subsequent history is as detailed below.  INTERVAL HISTORY: Sydney Ford returns alone today for followup of her locally advanced right breast cancer. She has been receiving neoadjuvant chemotherapy. She completed 4 dose dense cycles of doxorubicin/cyclophosphamide, then proceeded with weekly carboplatin/paclitaxel. There were some delays in treatment due 2 recurrent kidney stones, and low  counts. She was due for her ninth of 12 planned weekly doses of carboplatin/paclitaxel last week on 08/18/2013. Treatment was held that time do to poor tolerance, along with low counts and the development of peripheral neuropathy.   Sydney Ford is here today for repeat labs and followup. She completed a course of Cipro last week for chemotherapy-induced neutropenia, and her counts have recovered well after the Neulasta injection 1 week ago. She did have a headache and bone pain for several days following the Neulasta injection, but fortunately these have both resolved. She has had some low-grade fevers, the highest reported temp being 100.1 last Saturday (08/20/2013). Since that time, she has had no fevers of 100 or above.  Sydney Ford had also developed a rash on her hands and face, thought to be secondary to the paclitaxel. This continues to improve, and has almost completely resolved.  She also had mild hypokalemia last week, and was started on a low dose potassium supplement, 10 mEq twice daily. Her potassium has normalized.   Sydney Ford received a blood transfusion of 2 units packed red blood cells last week, and her hemoglobin is recovering nicely. Her energy level is gradually improving, as is her shortness of breath.  Sydney Ford continues to have pressure with urination, although she denies actual dysuria. She has had no visible hematuria and notes a "good flow" of urine. She would rate her pain at approximately 3 on a scale of 1-10. She feels like she could "passed a kidney stone any time", and at this point does not feel it necessary to see Dr. Diona Fanti unless her pain increases. We did review her metabolic panel today, which did show an elevated serum creatinine of 1.5.   REVIEW OF SYSTEMS:  Sydney Ford continues to have hot flashes and night sweats, and often has difficulty sleeping. She still has fatigue, although it has improved slightly since receiving a blood transfusion last week. She denies any abnormal bruising or  bleeding. She continues to have significant tingling bilaterally in her toes, but has had no additional signs of neuropathy in the upper extremities. The neuropathy in her lower extremities is stable, with no significant improvement. She is eating and drinking fairly well. She has occasional queasiness, but has had no emesis. She's had no change in bowel or bladder habits. She's had no mouth ulcers or oral sensitivity. She's had no increased cough or phlegm production. She does have some shortness of breath with exertion which is stable. She's had no chest pain or palpitations. The headache following the Neulasta injection has now resolved, and she denies any dizziness or change in vision. The bone pain has also resolved, but she continues to have chronic joint pain associated with arthritis. This is stable.  Otherwise, detailed review of systems is stable and noncontributory.   PAST MEDICAL HISTORY: Past Medical History  Diagnosis Date  . Hypertension   . Hyperlipidemia   . Thyroid disease   . Osteoporosis   . Hypothyroidism     hashimoto's  . Depression   . Pneumonia     2014  . Cold (disease) 03/21/12    couple of weeks ago - pt states much better now  . History of kidney stones     "passed 9 stones"  . GERD (gastroesophageal reflux disease)     no meds except occas tums  . Headache(784.0)     occas migraine  . Cancer     new dx of right breast cancer - planning chemo first  . Arthritis     oa; lower back pain - hx of injury yrs ago-occas flare ups  . Anemia     2005 - related to heavy menses with the thyroid probl  . Chronic kidney disease     kidney stone    PAST SURGICAL HISTORY:  (Updated 07/07/2013) Past Surgical History  Procedure Laterality Date  . Cesarean section  1987  . Tonsillectomy    . Uterine ablation  2008    no menstrual periods since procedure  . Portacath placement N/A 03/23/2013    Procedure: INSERTION PORT-A-CATH;  Surgeon: Rolm Bookbinder, MD;   Location: [facility];  Service: General;  Laterality: N/A;  . Cystoscopy with retrograde pyelogram, ureteroscopy and stent placement Right 06/30/2013    Procedure: CYSTOSCOPY WITH RETROGRADE PYELOGRAM, URETEROSCOPY, STONE EXTRACTION AND  STENT PLACEMENT;  Surgeon: Franchot Gallo, MD;  Location: [facility];  Service: Urology;  Laterality: Right;  . Holmium laser application Right 1/85/6314    Procedure: HOLMIUM LASER APPLICATION;  Surgeon: Franchot Gallo, MD;  Location: [facility];  Service: Urology;  Laterality: Right;    FAMILY HISTORY Family History  Problem Relation Age of Onset  . Heart failure Mother   . Heart attack Father   . Autoimmune disease Maternal Aunt     lupus, scelraderma, reynolds disease, and others  . Stroke Paternal Uncle   . Heart failure Maternal Grandmother   . Heart attack Maternal Grandfather   . Throat cancer Paternal Grandmother     dx late 68s; non smoker or drinker  . Heart attack Paternal Grandfather   . Schizophrenia Son 66  . Heart attack Paternal Uncle    the patient's father died at the age of 42 from a myocardial infarction. The  patient's mother died at the age of 78 with congestive heart failure. She also had a history of lupus. The patient had no brothers or sisters. There is no history of breast or ovarian cancer in the family to her knowledge.  GYNECOLOGIC HISTORY:   (reviewed on 08/25/2013)  Menarche age 88, first live birth age 43. She is GX P2. She underwent endometrial ablation in 2009 and has not had a period since that time. She never took hormone replacement. She did use birth control pills remotely with no complications  SOCIAL HISTORY: (Reviewd 08/25/2013)  Sydney Ford works at [employer] for a variety of long-term clients. She does not work for a company. Her husband Sydney Ford "Sydney Gulling" Satira Ford is semiretired. He works for a company that [employer] data on [place]. Son Sydney Ford is in [place] and works in Architect. Son Sydney Ford lives at home and  is disabled with a diagnosis of paranoid schizophrenia. The patient has no grandchildren. She is not a church attender    ADVANCED DIRECTIVES: Not in place   HEALTH MAINTENANCE: ( reviewed 08/25/2013 ) History  Substance Use Topics  . Smoking status: Former Smoker    Quit date: 03/17/1985  . Smokeless tobacco: Never Used  . Alcohol Use: No     Colonoscopy: Never  PAP: Remote  Bone density: November 2014, showed osteoporosis   according to the patient's report  Lipid panel:  Not on file, Dr. Wilson Singer   Allergies  Allergen Reactions  . Codeine Itching and Nausea Only    REACTION: Nausea and itching  . Compazine [Prochlorperazine Edisylate] Other (See Comments)    Head aches   . Other     Close contact with Some recycled plastics cause whelps  . Tape Rash    Needs OPSITE dressing with port access    Current Outpatient Prescriptions  Medication Sig Dispense Refill  . atorvastatin (LIPITOR) 20 MG tablet Take 20 mg by mouth every evening.       . metoCLOPramide (REGLAN) 5 MG tablet Take 1 tablet (5 mg total) by mouth 3 (three) times daily before meals.  90 tablet  4  . omeprazole (PRILOSEC) 40 MG capsule Take 1 capsule (40 mg total) by mouth daily.  30 capsule  4  . oxybutynin (DITROPAN) 5 MG tablet Take 1 tablet (5 mg total) by mouth 3 (three) times daily.  30 tablet  1  . potassium chloride (MICRO-K) 10 MEQ CR capsule Take 1 capsule (10 mEq total) by mouth 2 (two) times daily.  60 capsule  1  . PRESCRIPTION MEDICATION Chemotherapy at [facility]      . sertraline (ZOLOFT) 100 MG tablet TAKE ONE TABLET BY MOUTH AT BEDTIME  30 tablet  0  . tamsulosin (FLOMAX) 0.4 MG CAPS capsule       . thyroid (ARMOUR) 90 MG tablet Take 90 mg by mouth every morning.      . traMADol (ULTRAM) 50 MG tablet Take 1-2 tablets (50-100 mg total) by mouth 3 (three) times daily as needed for moderate pain or severe pain.  100 tablet  0  . traZODone (DESYREL) 50 MG tablet Take 1 tablet (50 mg total) by mouth at  bedtime as needed for sleep.  30 tablet  1  . dexamethasone (DECADRON) 4 MG tablet TAKE 2 TABLETS BY MOUTH ONCE A DAY ON THE DAY AFTER CHEMOTHERAPY AND THEN TAKE 2 TABLETS TWICE DAILY FOR 2 DAYS WITH FOOD.  60 tablet  0  . lidocaine-prilocaine (EMLA) cream Apply 1  application topically daily as needed. port      . LORazepam (ATIVAN) 0.5 MG tablet Take 0.5 mg by mouth every 8 (eight) hours as needed (nausea).       . nystatin (MYCOSTATIN) 100000 UNIT/ML suspension Take 5 mLs (500,000 Units total) by mouth 4 (four) times daily.  180 mL  2  . ondansetron (ZOFRAN) 8 MG tablet Take 1 tablet (8 mg total) by mouth every 8 (eight) hours as needed for nausea or vomiting.  20 tablet  1  . promethazine (PHENERGAN) 25 MG suppository Place 1 suppository (25 mg total) rectally every 8 (eight) hours as needed for nausea or vomiting.  12 each  0   No current facility-administered medications for this visit.    PHYSICAL EXAM: Middle-aged Caucasian female who appears very weak and tired but is in no acute distress    Filed Vitals:   08/25/13 1259  BP: 167/91  Pulse: 76  Temp: 99.6 F (37.6 C)  Resp: 18     Body mass index is 39.33 kg/(m^2).    ECOG FS: 1 Filed Weights   08/25/13 1259  Weight: 215 lb 1.6 oz (97.569 kg)   Physical Exam: HEENT:  Sclerae anicteric. Conjunctivae pink. Oropharynx is pink and moist. No evidence of mucositis or oropharyngeal candidiasis. Neck is supple, trachea midline.  NODES:  No cervical or supraclavicular lymphadenopathy palpated.  BREAST EXAM: Breast exam was deferred today. Axillae are benign bilaterally, no palpable lymphadenopathy. LUNGS:  Clear to auscultation bilaterally.  No wheezes or rhonchi HEART:  Regular rate and rhythm. No murmur appreciated. ABDOMEN:  Soft, obese, nontender.  Positive bowel sounds.  MSK:  No focal spinal tenderness to palpation. Full range of motion bilaterally in the upper extremities. No CVA tenderness. EXTREMITIES:  No peripheral edema.   No clubbing or cyanosis. SKIN: The previously noted erythema and rash on the face has almost completely resolved, with only very slight erythema noted on the forehead today. No pustules or vesicles noted. There are no additional rashes noted today. No excessive ecchymoses. No petechiae. No pallor. Port is intact in the chest wall with no erythema or edema, and no evidence of infection/cellulitis. NEURO:  Nonfocal. Well oriented. Appropriate affect.  STUDIES: Mr Breast Bilateral W Wo Contrast 08/17/2013   CLINICAL DATA:  58 year old female with known right breast cancer. Evaluate response to neoadjuvant chemotherapy.  LABS:  None obtained today.  EXAM: BILATERAL BREAST MRI WITH AND WITHOUT CONTRAST  TECHNIQUE: Multiplanar, multisequence MR images of both breasts were obtained prior to and following the intravenous administration of 54m of MultiHance.  THREE-DIMENSIONAL MR IMAGE RENDERING ON INDEPENDENT WORKSTATION:  Three-dimensional MR images were rendered by post-processing of the original MR data on an independent workstation. The three-dimensional MR images were interpreted, and findings are reported in the following complete MRI report for this study. Three dimensional images were evaluated at the independent Dyna[employer]ad workstation  COMPARISON:  05/17/2013 and 03/08/2013 breast MRs. Prior mammograms.  FINDINGS: Breast composition: b.  Scattered fibroglandular tissue.  Background parenchymal enhancement: Mild  Right breast: Minimal residual non masslike enhancement within the upper-outer right breast is identified in the area of known neoplasm and has slightly decreased since 05/17/2013.  No other abnormal enhancement within the right breast identified.  Left breast: No mass or abnormal enhancement.  Lymph nodes: No abnormal appearing lymph nodes.  Ancillary findings:  None.  IMPRESSION: Slight further treatment response with slightly decreased minimal residual non masslike enhancement within the upper-outer  right breast, in the  area of biopsy-proven neoplasm.  No MR evidence of left breast neoplasm or abnormal lymph nodes.  RECOMMENDATION: Treatment plan  BI-RADS CATEGORY  6: Known biopsy-proven malignancy.   Electronically Signed   By: Hassan Rowan M.D.   On: 08/17/2013 14:27    LAB RESULTS:  Lab Results  Component Value Date   WBC 15.2* 08/25/2013   NEUTROABS 13.3* 08/25/2013   HGB 10.5* 08/25/2013   HCT 32.3* 08/25/2013   MCV 96.0 08/25/2013   PLT 161 08/25/2013      Chemistry      Component Value Date/Time   NA 146* 08/25/2013 1248   NA 138 06/30/2013 0747   K 3.6 08/25/2013 1248   K 4.1 06/30/2013 0747   CL 101 06/30/2013 0747   CO2 30* 08/25/2013 1248   CO2 25 06/30/2013 0747   BUN 10.3 08/25/2013 1248   BUN 23 06/30/2013 0747   CREATININE 1.5* 08/25/2013 1248   CREATININE 1.22* 06/30/2013 0747      Component Value Date/Time   CALCIUM 8.7 08/25/2013 1248   CALCIUM 8.9 06/30/2013 0747   ALKPHOS 182* 08/25/2013 1248   ALKPHOS 65 05/27/2013 0530   AST 30 08/25/2013 1248   AST 7 05/27/2013 0530   ALT 51 08/25/2013 1248   ALT 11 05/27/2013 0530   BILITOT 0.62 08/25/2013 1248   BILITOT 0.5 05/27/2013 0530       ASSESSMENT: 58 y.o. 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  (2)  being treated in the neoadjuvant setting,   (a) completed 4 dose dense cycles of doxorubicin/cyclophosphamide 05/19/2013. Cycle 4 was delayed one week because of bad weather.  Neulasta on day 2 for granulocyte support.  (b) plan was to receive 12 weekly doses of carboplatin/paclitaxel prior to definitive surgery, first dose 06/09/2013. Carboplatin/paclitaxel was discontinued due to to poor tolerance, including pancytopenia, rash, and neuropathy. Patient received both agents for 7 doses, with paclitaxel given alone with cycle 8 secondary to thrombocytopenia.   Final dose of neoadjuvant chemotherapy was given on 08/04/2013.  (3)  recurrent nephrolithiasis - followed by Dr. Diona Fanti  (4)  thrombocytopenia, asymptomatic - likely secondary to carboplatin - resolved  (5)  rash on the hands and face, possibly secondary to paclitaxel - resolving  (6) symptomatic anemia - likely chemotherapy-induced.  Improving, status post blood transfusion on 08/18/2013.  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 Donne Hazel, tomorrow to discuss her definitive surgery. I would expect that she will have her surgery within the next couple of weeks, and we will plan on seeing her back here in approximately 6 weeks to review her pathology results and discuss her long-term care. If she needs adjuvant radiation therapy, that will be scheduled for her at that time as well.  I have scheduled him to return one more time next week on June 18 for labs only to make sure all of her counts have stabilized. In the meanwhile, I have advised her to continue on her potassium at 10 mEq twice daily for mild hypokalemia.   All of the above was reviewed in detail with Sydney Ford today, and she voices both her understanding and agreement with this plan.  She knows  to call with any changes or problems.   Durene Fruits    Medical oncology 08/25/2013

## 2013-08-25 NOTE — Telephone Encounter (Signed)
per pof to sch pt appts-gave pt copy of sch °

## 2013-08-26 ENCOUNTER — Encounter (INDEPENDENT_AMBULATORY_CARE_PROVIDER_SITE_OTHER): Payer: Self-pay | Admitting: General Surgery

## 2013-08-26 ENCOUNTER — Ambulatory Visit (INDEPENDENT_AMBULATORY_CARE_PROVIDER_SITE_OTHER): Payer: BC Managed Care – PPO | Admitting: General Surgery

## 2013-08-26 VITALS — BP 130/84 | HR 84 | Temp 98.0°F | Ht 62.0 in | Wt 216.0 lb

## 2013-08-26 DIAGNOSIS — C50419 Malignant neoplasm of upper-outer quadrant of unspecified female breast: Secondary | ICD-10-CM

## 2013-08-26 DIAGNOSIS — C50411 Malignant neoplasm of upper-outer quadrant of right female breast: Secondary | ICD-10-CM

## 2013-08-26 NOTE — Progress Notes (Signed)
Patient ID: Sydney Ford, female   DOB: Oct 11, 1955, 58 y.o.   MRN: [id number]  Chief Complaint  Patient presents with  . Routine Post Op    HPI Sydney Ford is a 58 y.o. female.   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  This was palpable at about 2 cm and mri showed a 3x2x1.7 cm mass.  The left breast and all nodes have remained normal before and after treatment.  She underwent primary chemotherapy and completed 8/12 taxol.  She had significant side effects including peripheral neuropathy and neutropenia requiring hospital admission.  She also had nephrolithiasis requiring a procedure.  She returns today having completed chemo 2 weeks ago now. She is doing better and regaining her strength.  Her latest mr shows essential resolution of the area. She says it really disappeared on her exam after the first cycle.  Past Medical History  Diagnosis Date  . Hypertension   . Hyperlipidemia   . Thyroid disease   . Osteoporosis   . Hypothyroidism     hashimoto's  . Depression   . Pneumonia     2014  . Cold (disease) 03/21/12    couple of weeks ago - pt states much better now  . History of kidney stones     "passed 9 stones"  . GERD (gastroesophageal reflux disease)     no meds except occas tums  . Headache(784.0)     occas migraine  . Cancer     new dx of right breast cancer - planning chemo first  . Arthritis     oa; lower back pain - hx of injury yrs ago-occas flare ups  . Anemia     2005 - related to heavy menses with the thyroid probl  . Chronic kidney disease     kidney stone    Past Surgical History  Procedure Laterality Date  . Cesarean section  1987  . Tonsillectomy    . Uterine ablation  2008    no menstrual periods since procedure  . Portacath placement N/A 03/23/2013    Procedure: INSERTION PORT-A-CATH;  Surgeon: Rolm Bookbinder, MD;  Location: [facility];  Service: General;  Laterality: N/A;  . Cystoscopy with retrograde  pyelogram, ureteroscopy and stent placement Right 06/30/2013    Procedure: CYSTOSCOPY WITH RETROGRADE PYELOGRAM, URETEROSCOPY, STONE EXTRACTION AND  STENT PLACEMENT;  Surgeon: Franchot Gallo, MD;  Location: [facility];  Service: Urology;  Laterality: Right;  . Holmium laser application Right 5/80/9983    Procedure: HOLMIUM LASER APPLICATION;  Surgeon: Franchot Gallo, MD;  Location: [facility];  Service: Urology;  Laterality: Right;    Family History  Problem Relation Age of Onset  . Heart failure Mother   . Heart attack Father   . Autoimmune disease Maternal Aunt     lupus, scelraderma, reynolds disease, and others  . Stroke Paternal Uncle   . Heart failure Maternal Grandmother   . Heart attack Maternal Grandfather   . Throat cancer Paternal Grandmother     dx late 5s; non smoker or drinker  . Heart attack Paternal Grandfather   . Schizophrenia Son 6  . Heart attack Paternal Uncle     Social History History  Substance Use Topics  . Smoking status: Former Smoker    Quit date: 03/17/1985  . Smokeless tobacco: Never Used  . Alcohol Use: No    Allergies  Allergen Reactions  . Codeine Itching and  Nausea Only    REACTION: Nausea and itching  . Compazine [Prochlorperazine Edisylate] Other (See Comments)    Head aches   . Other     Close contact with Some recycled plastics cause whelps  . Tape Rash    Needs OPSITE dressing with port access    Current Outpatient Prescriptions  Medication Sig Dispense Refill  . atorvastatin (LIPITOR) 20 MG tablet Take 20 mg by mouth every evening.       Marland Kitchen dexamethasone (DECADRON) 4 MG tablet TAKE 2 TABLETS BY MOUTH ONCE A DAY ON THE DAY AFTER CHEMOTHERAPY AND THEN TAKE 2 TABLETS TWICE DAILY FOR 2 DAYS WITH FOOD.  60 tablet  0  . lidocaine-prilocaine (EMLA) cream Apply 1 application topically daily as needed. port      . LORazepam (ATIVAN) 0.5 MG tablet Take 0.5 mg by mouth every 8 (eight) hours as needed (nausea).       . metoCLOPramide (REGLAN)  5 MG tablet Take 1 tablet (5 mg total) by mouth 3 (three) times daily before meals.  90 tablet  4  . nystatin (MYCOSTATIN) 100000 UNIT/ML suspension Take 5 mLs (500,000 Units total) by mouth 4 (four) times daily.  180 mL  2  . omeprazole (PRILOSEC) 40 MG capsule Take 1 capsule (40 mg total) by mouth daily.  30 capsule  4  . ondansetron (ZOFRAN) 8 MG tablet Take 1 tablet (8 mg total) by mouth every 8 (eight) hours as needed for nausea or vomiting.  20 tablet  1  . oxybutynin (DITROPAN) 5 MG tablet Take 1 tablet (5 mg total) by mouth 3 (three) times daily.  30 tablet  1  . potassium chloride (MICRO-K) 10 MEQ CR capsule Take 1 capsule (10 mEq total) by mouth 2 (two) times daily.  60 capsule  1  . PRESCRIPTION MEDICATION Chemotherapy at [facility]      . promethazine (PHENERGAN) 25 MG suppository Place 1 suppository (25 mg total) rectally every 8 (eight) hours as needed for nausea or vomiting.  12 each  0  . sertraline (ZOLOFT) 100 MG tablet TAKE ONE TABLET BY MOUTH AT BEDTIME  30 tablet  0  . tamsulosin (FLOMAX) 0.4 MG CAPS capsule       . thyroid (ARMOUR) 90 MG tablet Take 90 mg by mouth every morning.      . traMADol (ULTRAM) 50 MG tablet Take 1-2 tablets (50-100 mg total) by mouth 3 (three) times daily as needed for moderate pain or severe pain.  100 tablet  0  . traZODone (DESYREL) 50 MG tablet Take 1 tablet (50 mg total) by mouth at bedtime as needed for sleep.  30 tablet  1   No current facility-administered medications for this visit.    Review of Systems Review of Systems  Constitutional: Negative for fever, chills and unexpected weight change.  HENT: Negative for congestion, hearing loss, sore throat, trouble swallowing and voice change.   Eyes: Negative for visual disturbance.  Respiratory: Negative for cough and wheezing.   Cardiovascular: Negative for chest pain, palpitations and leg swelling.  Gastrointestinal: Negative for nausea, vomiting, abdominal pain, diarrhea, constipation, blood  in stool, abdominal distention and anal bleeding.  Genitourinary: Negative for hematuria, vaginal bleeding and difficulty urinating.  Musculoskeletal: Negative for arthralgias.  Skin: Negative for rash and wound.  Neurological: Negative for seizures, syncope and headaches.  Hematological: Negative for adenopathy. Does not bruise/bleed easily.  Psychiatric/Behavioral: Negative for confusion.    Blood pressure 130/84, pulse 84, temperature 98 F (  36.7 C), height 5\' 2"  (1.575 m), weight 216 lb (97.977 kg).  Physical Exam Physical Exam  Vitals reviewed. Constitutional: She appears well-developed and well-nourished.  Eyes: No scleral icterus.  Neck: Neck supple.  Cardiovascular: Normal rate, regular rhythm and normal heart sounds.   Pulmonary/Chest: Effort normal and breath sounds normal. She has no wheezes. She has no rales. Right breast exhibits no inverted nipple, no mass, no nipple discharge, no skin change and no tenderness. Left breast exhibits no inverted nipple, no mass, no nipple discharge, no skin change and no tenderness.    Lymphadenopathy:    She has no cervical adenopathy.    She has no axillary adenopathy.       Right: No supraclavicular adenopathy present.       Left: No supraclavicular adenopathy present.    Data Reviewed EXAM:  BILATERAL BREAST MRI WITH AND WITHOUT CONTRAST  TECHNIQUE:  Multiplanar, multisequence MR images of both breasts were obtained  prior to and following the intravenous administration of 26ml of  MultiHance.  THREE-DIMENSIONAL MR IMAGE RENDERING ON INDEPENDENT WORKSTATION:  Three-dimensional MR images were rendered by post-processing of the  original MR data on an independent workstation. The  three-dimensional MR images were interpreted, and findings are  reported in the following complete MRI report for this study. Three  dimensional images were evaluated at the independent DynaCad  workstation  COMPARISON: 05/17/2013 and 03/08/2013  breast MRs. Prior mammograms.  FINDINGS:  Breast composition: b. Scattered fibroglandular tissue.  Background parenchymal enhancement: Mild  Right breast: Minimal residual non masslike enhancement within the  upper-outer right breast is identified in the area of known neoplasm  and has slightly decreased since 05/17/2013.  No other abnormal enhancement within the right breast identified.  Left breast: No mass or abnormal enhancement.  Lymph nodes: No abnormal appearing lymph nodes.  Ancillary findings: None.  IMPRESSION:  Slight further treatment response with slightly decreased minimal  residual non masslike enhancement within the upper-outer right  breast, in the area of biopsy-proven neoplasm.  No MR evidence of left breast neoplasm or abnormal lymph nodes.    Assessment    Right breast cancer, clinical stage II s/p primary systemic therapy     Plan    Right breast radioactive seed guided lumpectomy, right axillary sentinel node biopsy She has had a great response to chemotherapy although she has completed all of it.  I think with her response she is good candidate for BCT. Plan surgery in another week or so.  We discussed all of the different options for surgery.  We discussed a sentinel lymph node biopsy as she does not appear to having lymph node involvement still. We discussed the performance of that with injection of radioactive tracer and blue dye. We discussed that she would have an incision underneath her axillary hairline. We discussed that there is a chance of having a positive node with a sentinel lymph node biopsy and we will await the permanent pathology to make any other first further decisions in terms of her treatment. One of these options might be to return to the operating room to perform an axillary lymph node dissection. We discussed up to a 5% risk lifetime of chronic shoulder pain as well as lymphedema associated with a sentinel lymph node biopsy. We also  discussed referral to PT/lymphedema specialist. We discussed the options for treatment of the breast cancer which included lumpectomy versus a mastectomy. We discussed the performance of the lumpectomy with a radioactive seed. We discussed  a 5-10% chance of a positive margin requiring reexcision in the operating room. We also discussed that she may need radiation therapy if she undergoes lumpectomy. We discussed the mastectomy and the postoperative care for that as well. We discussed that there is no difference in her survival whether she undergoes lumpectomy with radiation therapy or antiestrogen therapy versus a mastectomy. There is no survival advantage to her to mastectomy.  We discussed the risks of operation including bleeding, infection, possible reoperation.           Nicodemus Denk 08/26/2013, 7:46 PM

## 2013-08-29 ENCOUNTER — Encounter (HOSPITAL_BASED_OUTPATIENT_CLINIC_OR_DEPARTMENT_OTHER): Payer: Self-pay | Admitting: *Deleted

## 2013-08-29 NOTE — Progress Notes (Signed)
08/29/13 1538  OBSTRUCTIVE SLEEP APNEA  Have you ever been diagnosed with sleep apnea through a sleep study? No  Do you snore loudly (loud enough to be heard through closed doors)?  0  Do you often feel tired, fatigued, or sleepy during the daytime? 0  Has anyone observed you stop breathing during your sleep? 0  Do you have, or are you being treated for high blood pressure? 1  BMI more than 35 kg/m2? 1  Age over 60 years old? 1  Neck circumference greater than 40 cm/16 inches? 1  Gender: 0  Obstructive Sleep Apnea Score 4  Score 4 or greater  Results sent to PCP

## 2013-08-29 NOTE — Progress Notes (Signed)
pts last labd 08/25/13-had to have PRBC infusion-needs her labs done per dr wakefield-started back on htn meds-ekg done 1/15

## 2013-08-31 ENCOUNTER — Encounter (HOSPITAL_BASED_OUTPATIENT_CLINIC_OR_DEPARTMENT_OTHER)
Admission: RE | Admit: 2013-08-31 | Discharge: 2013-08-31 | Disposition: A | Discharge status: Home / Self Care | Payer: BC Managed Care – PPO | Source: Ambulatory Visit | Attending: General Surgery | Admitting: General Surgery

## 2013-08-31 LAB — BASIC METABOLIC PANEL
BUN: 16 mg/dL (ref 6–23)
CALCIUM: 9.5 mg/dL (ref 8.4–10.5)
CO2: 26 mEq/L (ref 19–32)
CREATININE: 1.2 mg/dL — AB (ref 0.50–1.10)
Chloride: 105 mEq/L (ref 96–112)
GFR calc Af Amer: 56 mL/min — ABNORMAL LOW (ref >90)
GFR calc non Af Amer: 48 mL/min — ABNORMAL LOW (ref >90)
Glucose, Bld: 97 mg/dL (ref 70–99)
Potassium: 4.4 mEq/L (ref 3.7–5.3)
Sodium: 145 mEq/L (ref 137–147)

## 2013-08-31 LAB — CBC WITH DIFFERENTIAL/PLATELET
Basophils Absolute: 0 10*3/uL (ref 0.0–0.1)
Basophils Relative: 0 % (ref 0–1)
EOS ABS: 0 10*3/uL (ref 0.0–0.7)
Eosinophils Relative: 0 % (ref 0–5)
HEMATOCRIT: 35.7 % — AB (ref 36.0–46.0)
HEMOGLOBIN: 11.4 g/dL — AB (ref 12.0–15.0)
LYMPHS ABS: 1 10*3/uL (ref 0.7–4.0)
Lymphocytes Relative: 15 % (ref 12–46)
MCH: 31.3 pg (ref 26.0–34.0)
MCHC: 31.9 g/dL (ref 30.0–36.0)
MCV: 98.1 fL (ref 78.0–100.0)
MONO ABS: 0.6 10*3/uL (ref 0.1–1.0)
MONOS PCT: 9 % (ref 3–12)
Neutro Abs: 5.1 10*3/uL (ref 1.7–7.7)
Neutrophils Relative %: 76 % (ref 43–77)
Platelets: 224 10*3/uL (ref 150–400)
RBC: 3.64 MIL/uL — ABNORMAL LOW (ref 3.87–5.11)
RDW: 18.8 % — ABNORMAL HIGH (ref 11.5–15.5)
WBC: 6.7 10*3/uL (ref 4.0–10.5)

## 2013-09-01 ENCOUNTER — Ambulatory Visit: Payer: BC Managed Care – PPO

## 2013-09-01 ENCOUNTER — Encounter (HOSPITAL_BASED_OUTPATIENT_CLINIC_OR_DEPARTMENT_OTHER): Payer: Self-pay | Admitting: Certified Registered"

## 2013-09-01 ENCOUNTER — Encounter (HOSPITAL_BASED_OUTPATIENT_CLINIC_OR_DEPARTMENT_OTHER): Payer: BC Managed Care – PPO | Admitting: Certified Registered"

## 2013-09-01 ENCOUNTER — Ambulatory Visit (HOSPITAL_BASED_OUTPATIENT_CLINIC_OR_DEPARTMENT_OTHER)
Admission: RE | Admit: 2013-09-01 | Discharge: 2013-09-01 | Disposition: A | Discharge status: Home / Self Care | Payer: BC Managed Care – PPO | Source: Ambulatory Visit | Attending: General Surgery | Admitting: General Surgery

## 2013-09-01 ENCOUNTER — Other Ambulatory Visit: Payer: BC Managed Care – PPO

## 2013-09-01 ENCOUNTER — Encounter (HOSPITAL_BASED_OUTPATIENT_CLINIC_OR_DEPARTMENT_OTHER)
Admission: RE | Disposition: A | Discharge status: Home / Self Care | Payer: Self-pay | Source: Ambulatory Visit | Attending: General Surgery

## 2013-09-01 ENCOUNTER — Ambulatory Visit (HOSPITAL_COMMUNITY)
Admission: RE | Admit: 2013-09-01 | Discharge: 2013-09-01 | Disposition: A | Discharge status: Home / Self Care | Payer: BC Managed Care – PPO | Source: Ambulatory Visit | Attending: General Surgery | Admitting: General Surgery

## 2013-09-01 ENCOUNTER — Ambulatory Visit (HOSPITAL_BASED_OUTPATIENT_CLINIC_OR_DEPARTMENT_OTHER): Payer: BC Managed Care – PPO | Admitting: Certified Registered"

## 2013-09-01 DIAGNOSIS — C50419 Malignant neoplasm of upper-outer quadrant of unspecified female breast: Secondary | ICD-10-CM | POA: Insufficient documentation

## 2013-09-01 DIAGNOSIS — M1990 Unspecified osteoarthritis, unspecified site: Secondary | ICD-10-CM | POA: Insufficient documentation

## 2013-09-01 DIAGNOSIS — K219 Gastro-esophageal reflux disease without esophagitis: Secondary | ICD-10-CM | POA: Insufficient documentation

## 2013-09-01 DIAGNOSIS — I10 Essential (primary) hypertension: Secondary | ICD-10-CM | POA: Insufficient documentation

## 2013-09-01 DIAGNOSIS — Z452 Encounter for adjustment and management of vascular access device: Secondary | ICD-10-CM | POA: Insufficient documentation

## 2013-09-01 DIAGNOSIS — M810 Age-related osteoporosis without current pathological fracture: Secondary | ICD-10-CM | POA: Insufficient documentation

## 2013-09-01 DIAGNOSIS — Z01812 Encounter for preprocedural laboratory examination: Secondary | ICD-10-CM | POA: Insufficient documentation

## 2013-09-01 DIAGNOSIS — F329 Major depressive disorder, single episode, unspecified: Secondary | ICD-10-CM | POA: Insufficient documentation

## 2013-09-01 DIAGNOSIS — E039 Hypothyroidism, unspecified: Secondary | ICD-10-CM | POA: Insufficient documentation

## 2013-09-01 DIAGNOSIS — Z6835 Body mass index (BMI) 35.0-35.9, adult: Secondary | ICD-10-CM | POA: Insufficient documentation

## 2013-09-01 DIAGNOSIS — E785 Hyperlipidemia, unspecified: Secondary | ICD-10-CM | POA: Insufficient documentation

## 2013-09-01 DIAGNOSIS — E063 Autoimmune thyroiditis: Secondary | ICD-10-CM | POA: Insufficient documentation

## 2013-09-01 DIAGNOSIS — Z87891 Personal history of nicotine dependence: Secondary | ICD-10-CM | POA: Insufficient documentation

## 2013-09-01 DIAGNOSIS — J45909 Unspecified asthma, uncomplicated: Secondary | ICD-10-CM | POA: Insufficient documentation

## 2013-09-01 DIAGNOSIS — D649 Anemia, unspecified: Secondary | ICD-10-CM | POA: Insufficient documentation

## 2013-09-01 DIAGNOSIS — G43909 Migraine, unspecified, not intractable, without status migrainosus: Secondary | ICD-10-CM | POA: Insufficient documentation

## 2013-09-01 DIAGNOSIS — C50411 Malignant neoplasm of upper-outer quadrant of right female breast: Secondary | ICD-10-CM

## 2013-09-01 DIAGNOSIS — C50919 Malignant neoplasm of unspecified site of unspecified female breast: Secondary | ICD-10-CM

## 2013-09-01 DIAGNOSIS — F3289 Other specified depressive episodes: Secondary | ICD-10-CM | POA: Insufficient documentation

## 2013-09-01 HISTORY — PX: BREAST LUMPECTOMY WITH RADIOACTIVE SEED LOCALIZATION: SHX6424

## 2013-09-01 HISTORY — PX: PORT-A-CATH REMOVAL: SHX5289

## 2013-09-01 HISTORY — PX: AXILLARY SENTINEL NODE BIOPSY: SHX5738

## 2013-09-01 SURGERY — BREAST LUMPECTOMY WITH RADIOACTIVE SEED LOCALIZATION
Anesthesia: Regional | Site: Chest | Laterality: Right

## 2013-09-01 MED ORDER — HYDROMORPHONE HCL PF 1 MG/ML IJ SOLN
INTRAMUSCULAR | Status: AC
  Filled 2013-09-01: qty 1

## 2013-09-01 MED ORDER — BUPIVACAINE HCL (PF) 0.25 % IJ SOLN
INTRAMUSCULAR | Status: DC | PRN
  Administered 2013-09-01: 6 mL

## 2013-09-01 MED ORDER — BUPIVACAINE HCL (PF) 0.25 % IJ SOLN
INTRAMUSCULAR | Status: AC
  Filled 2013-09-01: qty 30

## 2013-09-01 MED ORDER — FENTANYL CITRATE 0.05 MG/ML IJ SOLN
INTRAMUSCULAR | Status: DC | PRN
  Administered 2013-09-01 (×2): 50 ug via INTRAVENOUS

## 2013-09-01 MED ORDER — METHYLENE BLUE 1 % INJ SOLN
INTRAMUSCULAR | Status: AC
  Filled 2013-09-01: qty 10

## 2013-09-01 MED ORDER — SODIUM CHLORIDE 0.9 % IJ SOLN
INTRAMUSCULAR | Status: AC
  Filled 2013-09-01: qty 10

## 2013-09-01 MED ORDER — DEXAMETHASONE SODIUM PHOSPHATE 4 MG/ML IJ SOLN
INTRAMUSCULAR | Status: DC | PRN
  Administered 2013-09-01: 10 mg via INTRAVENOUS

## 2013-09-01 MED ORDER — OXYCODONE HCL 5 MG PO TABS
ORAL_TABLET | ORAL | Status: AC
  Filled 2013-09-01: qty 1

## 2013-09-01 MED ORDER — MIDAZOLAM HCL 2 MG/2ML IJ SOLN
INTRAMUSCULAR | Status: AC
  Filled 2013-09-01: qty 2

## 2013-09-01 MED ORDER — LIDOCAINE HCL (CARDIAC) 20 MG/ML IV SOLN
INTRAVENOUS | Status: DC | PRN
  Administered 2013-09-01: 100 mg via INTRAVENOUS

## 2013-09-01 MED ORDER — LACTATED RINGERS IV SOLN
INTRAVENOUS | Status: DC
  Administered 2013-09-01 (×2): via INTRAVENOUS

## 2013-09-01 MED ORDER — FENTANYL CITRATE 0.05 MG/ML IJ SOLN
50.0000 ug | INTRAMUSCULAR | Status: DC | PRN
  Administered 2013-09-01: 100 ug via INTRAVENOUS
  Administered 2013-09-01: 50 ug via INTRAVENOUS

## 2013-09-01 MED ORDER — FENTANYL CITRATE 0.05 MG/ML IJ SOLN
INTRAMUSCULAR | Status: AC
  Filled 2013-09-01: qty 6

## 2013-09-01 MED ORDER — CEFAZOLIN SODIUM-DEXTROSE 2-3 GM-% IV SOLR
INTRAVENOUS | Status: AC
  Filled 2013-09-01: qty 50

## 2013-09-01 MED ORDER — SCOPOLAMINE 1 MG/3DAYS TD PT72
MEDICATED_PATCH | TRANSDERMAL | Status: AC
  Filled 2013-09-01: qty 1

## 2013-09-01 MED ORDER — OXYCODONE HCL 5 MG PO TABS
5.0000 mg | ORAL_TABLET | Freq: Once | ORAL | Status: AC | PRN
  Administered 2013-09-01: 5 mg via ORAL

## 2013-09-01 MED ORDER — EPHEDRINE SULFATE 50 MG/ML IJ SOLN
INTRAMUSCULAR | Status: DC | PRN
  Administered 2013-09-01: 5 mg via INTRAVENOUS
  Administered 2013-09-01: 10 mg via INTRAVENOUS

## 2013-09-01 MED ORDER — OXYCODONE HCL 5 MG/5ML PO SOLN: 5.0000 mg | Freq: Once | ORAL | Status: AC | PRN

## 2013-09-01 MED ORDER — SCOPOLAMINE 1 MG/3DAYS TD PT72
1.0000 | MEDICATED_PATCH | TRANSDERMAL | Status: DC
  Administered 2013-09-01: 1.5 mg via TRANSDERMAL

## 2013-09-01 MED ORDER — OXYCODONE-ACETAMINOPHEN 10-325 MG PO TABS: 1.0000 | ORAL_TABLET | Freq: Four times a day (QID) | ORAL | Status: DC | PRN

## 2013-09-01 MED ORDER — SODIUM CHLORIDE 0.9 % IJ SOLN
INTRAMUSCULAR | Status: DC | PRN
  Administered 2013-09-01: 13:00:00 via INTRAMUSCULAR

## 2013-09-01 MED ORDER — ONDANSETRON HCL 4 MG/2ML IJ SOLN: 4.0000 mg | Freq: Once | INTRAMUSCULAR | Status: DC | PRN

## 2013-09-01 MED ORDER — CEFAZOLIN SODIUM-DEXTROSE 2-3 GM-% IV SOLR
2.0000 g | INTRAVENOUS | Status: AC
  Administered 2013-09-01: 2 g via INTRAVENOUS

## 2013-09-01 MED ORDER — MIDAZOLAM HCL 2 MG/2ML IJ SOLN
1.0000 mg | INTRAMUSCULAR | Status: DC | PRN
  Administered 2013-09-01 (×2): 1 mg via INTRAVENOUS

## 2013-09-01 MED ORDER — PROPOFOL 10 MG/ML IV BOLUS
INTRAVENOUS | Status: DC | PRN
  Administered 2013-09-01: 200 mg via INTRAVENOUS

## 2013-09-01 MED ORDER — ONDANSETRON HCL 4 MG/2ML IJ SOLN
INTRAMUSCULAR | Status: DC | PRN
  Administered 2013-09-01: 4 mg via INTRAVENOUS

## 2013-09-01 MED ORDER — FENTANYL CITRATE 0.05 MG/ML IJ SOLN
INTRAMUSCULAR | Status: AC
Start: 2013-09-01 — End: 2013-09-01
  Filled 2013-09-01: qty 2

## 2013-09-01 MED ORDER — HYDROMORPHONE HCL PF 1 MG/ML IJ SOLN
0.2500 mg | INTRAMUSCULAR | Status: DC | PRN
  Administered 2013-09-01 (×2): 0.5 mg via INTRAVENOUS

## 2013-09-01 SURGICAL SUPPLY — 66 items
ADH SKN CLS APL DERMABOND .7 (GAUZE/BANDAGES/DRESSINGS) ×4
APL SKNCLS STERI-STRIP NONHPOA (GAUZE/BANDAGES/DRESSINGS)
APPLIER CLIP 9.375 MED OPEN (MISCELLANEOUS)
APR CLP MED 9.3 20 MLT OPN (MISCELLANEOUS)
BENZOIN TINCTURE PRP APPL 2/3 (GAUZE/BANDAGES/DRESSINGS) ×2 IMPLANT
BINDER BREAST LRG (GAUZE/BANDAGES/DRESSINGS) IMPLANT
BINDER BREAST MEDIUM (GAUZE/BANDAGES/DRESSINGS) IMPLANT
BINDER BREAST XLRG (GAUZE/BANDAGES/DRESSINGS) IMPLANT
BINDER BREAST XXLRG (GAUZE/BANDAGES/DRESSINGS) ×1 IMPLANT
BLADE SURG 15 STRL LF DISP TIS (BLADE) ×2 IMPLANT
BLADE SURG 15 STRL SS (BLADE) ×3
BNDG COHESIVE 4X5 TAN STRL (GAUZE/BANDAGES/DRESSINGS) IMPLANT
CANISTER SUC SOCK COL 7IN (MISCELLANEOUS) IMPLANT
CANISTER SUCT 1200ML W/VALVE (MISCELLANEOUS) IMPLANT
CHLORAPREP W/TINT 26ML (MISCELLANEOUS) ×3 IMPLANT
CLIP APPLIE 9.375 MED OPEN (MISCELLANEOUS) IMPLANT
COVER MAYO STAND STRL (DRAPES) ×3 IMPLANT
COVER PROBE W GEL 5X96 (DRAPES) ×3 IMPLANT
COVER TABLE BACK 60X90 (DRAPES) ×3 IMPLANT
DECANTER SPIKE VIAL GLASS SM (MISCELLANEOUS) ×2 IMPLANT
DERMABOND ADVANCED (GAUZE/BANDAGES/DRESSINGS) ×2
DERMABOND ADVANCED .7 DNX12 (GAUZE/BANDAGES/DRESSINGS) ×2 IMPLANT
DEVICE DUBIN W/COMP PLATE 8390 (MISCELLANEOUS) ×4 IMPLANT
DRAPE LAPAROSCOPIC ABDOMINAL (DRAPES) ×1 IMPLANT
DRAPE PED LAPAROTOMY (DRAPES) ×2 IMPLANT
DRAPE U-SHAPE 76X120 STRL (DRAPES) IMPLANT
DRSG TEGADERM 4X4.75 (GAUZE/BANDAGES/DRESSINGS) ×2 IMPLANT
ELECT COATED BLADE 2.86 ST (ELECTRODE) ×3 IMPLANT
ELECT REM PT RETURN 9FT ADLT (ELECTROSURGICAL) ×3
ELECTRODE REM PT RTRN 9FT ADLT (ELECTROSURGICAL) ×2 IMPLANT
GLOVE BIO SURGEON STRL SZ7 (GLOVE) ×6 IMPLANT
GLOVE BIOGEL PI IND STRL 6.5 (GLOVE) IMPLANT
GLOVE BIOGEL PI IND STRL 7.5 (GLOVE) ×2 IMPLANT
GLOVE BIOGEL PI INDICATOR 6.5 (GLOVE) ×2
GLOVE BIOGEL PI INDICATOR 7.5 (GLOVE) ×1
GLOVE ECLIPSE 6.5 STRL STRAW (GLOVE) ×2 IMPLANT
GLOVE EXAM NITRILE MD LF STRL (GLOVE) ×2 IMPLANT
GOWN STRL REUS W/ TWL LRG LVL3 (GOWN DISPOSABLE) ×6 IMPLANT
GOWN STRL REUS W/TWL LRG LVL3 (GOWN DISPOSABLE) ×6
KIT MARKER MARGIN INK (KITS) ×3 IMPLANT
NDL HYPO 25X1 1.5 SAFETY (NEEDLE) ×2 IMPLANT
NEEDLE HYPO 25X1 1.5 SAFETY (NEEDLE) ×6 IMPLANT
NS IRRIG 1000ML POUR BTL (IV SOLUTION) ×1 IMPLANT
PACK BASIN DAY SURGERY FS (CUSTOM PROCEDURE TRAY) ×3 IMPLANT
PENCIL BUTTON HOLSTER BLD 10FT (ELECTRODE) ×3 IMPLANT
SLEEVE SCD COMPRESS KNEE MED (MISCELLANEOUS) ×3 IMPLANT
SPONGE GAUZE 4X4 12PLY STER LF (GAUZE/BANDAGES/DRESSINGS) ×3 IMPLANT
SPONGE LAP 4X18 X RAY DECT (DISPOSABLE) ×4 IMPLANT
STAPLER VISISTAT 35W (STAPLE) ×3 IMPLANT
STOCKINETTE IMPERVIOUS LG (DRAPES) IMPLANT
STRIP CLOSURE SKIN 1/2X4 (GAUZE/BANDAGES/DRESSINGS) ×3 IMPLANT
SUT MNCRL AB 4-0 PS2 18 (SUTURE) ×3 IMPLANT
SUT MON AB 4-0 PC3 18 (SUTURE) ×3 IMPLANT
SUT MON AB 5-0 PS2 18 (SUTURE) ×1 IMPLANT
SUT SILK 2 0 SH (SUTURE) IMPLANT
SUT VIC AB 2-0 SH 27 (SUTURE) ×9
SUT VIC AB 2-0 SH 27XBRD (SUTURE) ×2 IMPLANT
SUT VIC AB 3-0 SH 27 (SUTURE) ×9
SUT VIC AB 3-0 SH 27X BRD (SUTURE) ×2 IMPLANT
SUT VIC AB 5-0 PS2 18 (SUTURE) ×1 IMPLANT
SUT VICRYL AB 3 0 TIES (SUTURE) IMPLANT
SYR CONTROL 10ML LL (SYRINGE) ×6 IMPLANT
TOWEL OR 17X24 6PK STRL BLUE (TOWEL DISPOSABLE) ×3 IMPLANT
TOWEL OR NON WOVEN STRL DISP B (DISPOSABLE) ×3 IMPLANT
TUBE CONNECTING 20X1/4 (TUBING) ×3 IMPLANT
YANKAUER SUCT BULB TIP NO VENT (SUCTIONS) ×1 IMPLANT

## 2013-09-01 NOTE — Anesthesia Procedure Notes (Addendum)
Procedure Name: LMA Insertion Date/Time: 09/01/2013 12:28 PM Performed by: Baxter Flattery Pre-anesthesia Checklist: Patient identified, Emergency Drugs available, Suction available and Patient being monitored Patient Re-evaluated:Patient Re-evaluated prior to inductionOxygen Delivery Method: Circle System Utilized Preoxygenation: Pre-oxygenation with 100% oxygen Intubation Type: IV induction Ventilation: Mask ventilation without difficulty LMA: LMA inserted LMA Size: 4.0 Number of attempts: 1 Airway Equipment and Method: bite block Placement Confirmation: positive ETCO2 and breath sounds checked- equal and bilateral Tube secured with: Tape Dental Injury: Teeth and Oropharynx as per pre-operative assessment     Anesthesia Regional Block:  Pectoralis block  Pre-Anesthetic Checklist: ,, timeout performed, Correct Patient, Correct Site, Correct Laterality, Correct Procedure, Correct Position, site marked, Risks and benefits discussed,  Surgical consent,  Pre-op evaluation,  At surgeon's request and post-op pain management  Laterality: Right and Upper  Prep: chloraprep       Needles:  Injection technique: Single-shot  Needle Type: Echogenic Needle     Needle Length: 9cm 9 cm Needle Gauge: 21 and 21 G    Additional Needles:  Procedures: ultrasound guided (picture in chart) Pectoralis block Narrative:  Start time: 09/01/2013 12:02 PM End time: 09/01/2013 12:12 PM Injection made incrementally with aspirations every 5 mL.  Performed by: Personally  Anesthesiologist: Lorrene Reid, MD  Additional Notes: After completion of the axillary block pt turned supine and 10 ml given by USG in Pec major line between Pec minor.  Tol well.

## 2013-09-01 NOTE — Op Note (Signed)
Preoperative diagnosis: clinical stage II right breast TNBC s/p primary chemotherapy Postoperative diagnosis: same as above Procedure: 1. Right breast seed guided lumpectomy 2. Injection of blue dye for sentinel node  3. Right axillary sentinel node biopsy 4. Removal of left subclavian port Surgeon: Dr Serita Grammes Anesthesia: general with lma, pectoral block EBL: minimal Drains: none Specimen: 1. Right breast tissue marked with paint, seed in place 2. Additional right breast superior and medial margin marked short stitch superior, long lateral, double deep 3. Right axillary sentinel node times 3, counts of 372, 110 and palpable Sponge and needle count correct times two Disposition to recovery stable  Indications: This is a 22 yof who had clinical stage II right breast triple negative cancer and underwent primary systemic chemotherapy with a great radiologic response.  She had a difficult time with chemotherapy and we discussed all her options for treatment. We have decided to proceed with breast conservation therapy and she would also like her port to be removed.  Procedure: After informed consent was obtained the patient was taken to the operating room. She had a radioactive seed placement prior to beginning. She had a pectoral block. She was given cefazolin. Sequential compression devices were on her legs. She was then placed under general anesthesia without complication. Her chest was then prepped and draped in the standard sterile surgical fashion. A surgical timeout was performed.  I infiltrated underneath the right areolar complex a mixture of methylene blue and saline. Massaged for 2 minutes. I then proceeded to remove the port on the left side. I reentered her old incision. I removed the port and the line in its entirety. Hemostasis observed. I then closed with 3-0 Vicryl, 4-0 Monocryl, and Dermabond.  I then located the radioactive seed with the neoprobe. I made a superior  periareolar incision. I then used the neoprobe to guide the excision of the tumor and the surrounding tissue. The seed was in the lumpectomy specimen. I painted this. I then did a Faxitron mammogram which confirmed removal of the seed. The clip however was notl in the initial specimen. I then sent this to pathology who confirmed removal of the seed. I then removed more in the superior medial aspect of the lumpectomy cavity. I marked this with a short stitch superior, long stitch lateral, double stitch deep. A mammogram was taken of this which confirmed removal of the clip. I think the seed had migrated at least a centimeter away from the clip. I felt like I had a good margin around both of these areas at this point. There was no more palpable area due to her good response from chemotherapy. I then obtained hemostasis. I closed the cavity with 2-0 Vicryl after placing clips in each position around the cavity. I closed the dermis with 3-0 Vicryl and the skin with 5-0 Monocryl. Dermabond was placed over this as well as Steri-Strips.  I located the sentinel node with the neoprobe. I made a 2.5 centimeter incision below the axillary hairline. I carried this through the axillary fascia. I identified 2 hot nodes and one small palpable node that were excised all as sentinel lymph nodes. There was no background radioactivity. The counts are as listed above. I then obtained hemostasis. I closed her axillary fascia with 2-0 Vicryl. The dermis was closed with 3-0 Vicryl and the skin with 4-0 Monocryl. Dermabond and steristrips were placed over this wound as well. She tolerated this well. A breast binder was placed. She was extubated and transferred to  recovery in stable condition.

## 2013-09-01 NOTE — H&P (View-Only) (Signed)
Patient ID: Sydney Ford, female   DOB: 04/12/1955, 58 y.o.   MRN: [id number]  Chief Complaint  Patient presents with  . Routine Post Op    HPI Sydney Ford is a 58 y.o. female.   HPI This is a 57 yof who I saw initially with new diagnosis of a tnbc with Ki of 99%.  This was palpable at about 2 cm and mri showed a 3x2x1.7 cm mass.  The left breast and all nodes have remained normal before and after treatment.  She underwent primary chemotherapy and completed 8/12 taxol.  She had significant side effects including peripheral neuropathy and neutropenia requiring hospital admission.  She also had nephrolithiasis requiring a procedure.  She returns today having completed chemo 2 weeks ago now. She is doing better and regaining her strength.  Her latest mr shows essential resolution of the area. She says it really disappeared on her exam after the first cycle.  Past Medical History  Diagnosis Date  . Hypertension   . Hyperlipidemia   . Thyroid disease   . Osteoporosis   . Hypothyroidism     hashimoto's  . Depression   . Pneumonia     2014  . Cold (disease) 03/21/12    couple of weeks ago - pt states much better now  . History of kidney stones     "passed 9 stones"  . GERD (gastroesophageal reflux disease)     no meds except occas tums  . Headache(784.0)     occas migraine  . Cancer     new dx of right breast cancer - planning chemo first  . Arthritis     oa; lower back pain - hx of injury yrs ago-occas flare ups  . Anemia     2005 - related to heavy menses with the thyroid probl  . Chronic kidney disease     kidney stone    Past Surgical History  Procedure Laterality Date  . Cesarean section  1987  . Tonsillectomy    . Uterine ablation  2008    no menstrual periods since procedure  . Portacath placement N/A 03/23/2013    Procedure: INSERTION PORT-A-CATH;  Surgeon: Rolm Bookbinder, MD;  Location: [facility];  Service: General;  Laterality: N/A;  . Cystoscopy with retrograde  pyelogram, ureteroscopy and stent placement Right 06/30/2013    Procedure: CYSTOSCOPY WITH RETROGRADE PYELOGRAM, URETEROSCOPY, STONE EXTRACTION AND  STENT PLACEMENT;  Surgeon: Franchot Gallo, MD;  Location: [facility];  Service: Urology;  Laterality: Right;  . Holmium laser application Right 11/15/5174    Procedure: HOLMIUM LASER APPLICATION;  Surgeon: Franchot Gallo, MD;  Location: [facility];  Service: Urology;  Laterality: Right;    Family History  Problem Relation Age of Onset  . Heart failure Mother   . Heart attack Father   . Autoimmune disease Maternal Aunt     lupus, scelraderma, reynolds disease, and others  . Stroke Paternal Uncle   . Heart failure Maternal Grandmother   . Heart attack Maternal Grandfather   . Throat cancer Paternal Grandmother     dx late 39s; non smoker or drinker  . Heart attack Paternal Grandfather   . Schizophrenia Son 22  . Heart attack Paternal Uncle     Social History History  Substance Use Topics  . Smoking status: Former Smoker    Quit date: 03/17/1985  . Smokeless tobacco: Never Used  . Alcohol Use: No    Allergies  Allergen Reactions  . Codeine Itching and  Nausea Only    REACTION: Nausea and itching  . Compazine [Prochlorperazine Edisylate] Other (See Comments)    Head aches   . Other     Close contact with Some recycled plastics cause whelps  . Tape Rash    Needs OPSITE dressing with port access    Current Outpatient Prescriptions  Medication Sig Dispense Refill  . atorvastatin (LIPITOR) 20 MG tablet Take 20 mg by mouth every evening.       Marland Kitchen dexamethasone (DECADRON) 4 MG tablet TAKE 2 TABLETS BY MOUTH ONCE A DAY ON THE DAY AFTER CHEMOTHERAPY AND THEN TAKE 2 TABLETS TWICE DAILY FOR 2 DAYS WITH FOOD.  60 tablet  0  . lidocaine-prilocaine (EMLA) cream Apply 1 application topically daily as needed. port      . LORazepam (ATIVAN) 0.5 MG tablet Take 0.5 mg by mouth every 8 (eight) hours as needed (nausea).       . metoCLOPramide (REGLAN)  5 MG tablet Take 1 tablet (5 mg total) by mouth 3 (three) times daily before meals.  90 tablet  4  . nystatin (MYCOSTATIN) 100000 UNIT/ML suspension Take 5 mLs (500,000 Units total) by mouth 4 (four) times daily.  180 mL  2  . omeprazole (PRILOSEC) 40 MG capsule Take 1 capsule (40 mg total) by mouth daily.  30 capsule  4  . ondansetron (ZOFRAN) 8 MG tablet Take 1 tablet (8 mg total) by mouth every 8 (eight) hours as needed for nausea or vomiting.  20 tablet  1  . oxybutynin (DITROPAN) 5 MG tablet Take 1 tablet (5 mg total) by mouth 3 (three) times daily.  30 tablet  1  . potassium chloride (MICRO-K) 10 MEQ CR capsule Take 1 capsule (10 mEq total) by mouth 2 (two) times daily.  60 capsule  1  . PRESCRIPTION MEDICATION Chemotherapy at [facility]      . promethazine (PHENERGAN) 25 MG suppository Place 1 suppository (25 mg total) rectally every 8 (eight) hours as needed for nausea or vomiting.  12 each  0  . sertraline (ZOLOFT) 100 MG tablet TAKE ONE TABLET BY MOUTH AT BEDTIME  30 tablet  0  . tamsulosin (FLOMAX) 0.4 MG CAPS capsule       . thyroid (ARMOUR) 90 MG tablet Take 90 mg by mouth every morning.      . traMADol (ULTRAM) 50 MG tablet Take 1-2 tablets (50-100 mg total) by mouth 3 (three) times daily as needed for moderate pain or severe pain.  100 tablet  0  . traZODone (DESYREL) 50 MG tablet Take 1 tablet (50 mg total) by mouth at bedtime as needed for sleep.  30 tablet  1   No current facility-administered medications for this visit.    Review of Systems Review of Systems  Constitutional: Negative for fever, chills and unexpected weight change.  HENT: Negative for congestion, hearing loss, sore throat, trouble swallowing and voice change.   Eyes: Negative for visual disturbance.  Respiratory: Negative for cough and wheezing.   Cardiovascular: Negative for chest pain, palpitations and leg swelling.  Gastrointestinal: Negative for nausea, vomiting, abdominal pain, diarrhea, constipation, blood  in stool, abdominal distention and anal bleeding.  Genitourinary: Negative for hematuria, vaginal bleeding and difficulty urinating.  Musculoskeletal: Negative for arthralgias.  Skin: Negative for rash and wound.  Neurological: Negative for seizures, syncope and headaches.  Hematological: Negative for adenopathy. Does not bruise/bleed easily.  Psychiatric/Behavioral: Negative for confusion.    Blood pressure 130/84, pulse 84, temperature 98 F (  36.7 C), height 5\' 2"  (1.575 m), weight 216 lb (97.977 kg).  Physical Exam Physical Exam  Vitals reviewed. Constitutional: She appears well-developed and well-nourished.  Eyes: No scleral icterus.  Neck: Neck supple.  Cardiovascular: Normal rate, regular rhythm and normal heart sounds.   Pulmonary/Chest: Effort normal and breath sounds normal. She has no wheezes. She has no rales. Right breast exhibits no inverted nipple, no mass, no nipple discharge, no skin change and no tenderness. Left breast exhibits no inverted nipple, no mass, no nipple discharge, no skin change and no tenderness.    Lymphadenopathy:    She has no cervical adenopathy.    She has no axillary adenopathy.       Right: No supraclavicular adenopathy present.       Left: No supraclavicular adenopathy present.    Data Reviewed EXAM:  BILATERAL BREAST MRI WITH AND WITHOUT CONTRAST  TECHNIQUE:  Multiplanar, multisequence MR images of both breasts were obtained  prior to and following the intravenous administration of 42ml of  MultiHance.  THREE-DIMENSIONAL MR IMAGE RENDERING ON INDEPENDENT WORKSTATION:  Three-dimensional MR images were rendered by post-processing of the  original MR data on an independent workstation. The  three-dimensional MR images were interpreted, and findings are  reported in the following complete MRI report for this study. Three  dimensional images were evaluated at the independent DynaCad  workstation  COMPARISON: 05/17/2013 and 03/08/2013  breast MRs. Prior mammograms.  FINDINGS:  Breast composition: b. Scattered fibroglandular tissue.  Background parenchymal enhancement: Mild  Right breast: Minimal residual non masslike enhancement within the  upper-outer right breast is identified in the area of known neoplasm  and has slightly decreased since 05/17/2013.  No other abnormal enhancement within the right breast identified.  Left breast: No mass or abnormal enhancement.  Lymph nodes: No abnormal appearing lymph nodes.  Ancillary findings: None.  IMPRESSION:  Slight further treatment response with slightly decreased minimal  residual non masslike enhancement within the upper-outer right  breast, in the area of biopsy-proven neoplasm.  No MR evidence of left breast neoplasm or abnormal lymph nodes.    Assessment    Right breast cancer, clinical stage II s/p primary systemic therapy     Plan    Right breast radioactive seed guided lumpectomy, right axillary sentinel node biopsy She has had a great response to chemotherapy although she has completed all of it.  I think with her response she is good candidate for BCT. Plan surgery in another week or so.  We discussed all of the different options for surgery.  We discussed a sentinel lymph node biopsy as she does not appear to having lymph node involvement still. We discussed the performance of that with injection of radioactive tracer and blue dye. We discussed that she would have an incision underneath her axillary hairline. We discussed that there is a chance of having a positive node with a sentinel lymph node biopsy and we will await the permanent pathology to make any other first further decisions in terms of her treatment. One of these options might be to return to the operating room to perform an axillary lymph node dissection. We discussed up to a 5% risk lifetime of chronic shoulder pain as well as lymphedema associated with a sentinel lymph node biopsy. We also  discussed referral to PT/lymphedema specialist. We discussed the options for treatment of the breast cancer which included lumpectomy versus a mastectomy. We discussed the performance of the lumpectomy with a radioactive seed. We discussed  a 5-10% chance of a positive margin requiring reexcision in the operating room. We also discussed that she may need radiation therapy if she undergoes lumpectomy. We discussed the mastectomy and the postoperative care for that as well. We discussed that there is no difference in her survival whether she undergoes lumpectomy with radiation therapy or antiestrogen therapy versus a mastectomy. There is no survival advantage to her to mastectomy.  We discussed the risks of operation including bleeding, infection, possible reoperation.           Corrisa Gibby 08/26/2013, 7:46 PM

## 2013-09-01 NOTE — Discharge Instructions (Signed)
[facility] °Office Phone Number [phone] ° °BREAST BIOPSY/ PARTIAL MASTECTOMY: POST OP INSTRUCTIONS ° °Always review your discharge instruction sheet given to you by the facility where your surgery was performed. ° °IF YOU HAVE DISABILITY OR FAMILY LEAVE FORMS, YOU MUST BRING THEM TO THE OFFICE FOR PROCESSING.  DO NOT GIVE THEM TO YOUR DOCTOR. ° °1. A prescription for pain medication may be given to you upon discharge.  Take your pain medication as prescribed, if needed.  If narcotic pain medicine is not needed, then you may take acetaminophen (Tylenol), naprosyn (Alleve) or ibuprofen (Advil) as needed. °2. Take your usually prescribed medications unless otherwise directed °3. If you need a refill on your pain medication, please contact your pharmacy.  They will contact our office to request authorization.  Prescriptions will not be filled after 5pm or on week-ends. °4. You should eat very light the first 24 hours after surgery, such as soup, crackers, pudding, etc.  Resume your normal diet the day after surgery. °5. Most patients will experience some swelling and bruising in the breast.  Ice packs and a good support bra will help.  Wear the breast binder provided or a sports bra for 72 hours day and night.  After that wear a sports bra during the day until you return to the office. Swelling and bruising can take several days to resolve.  °6. It is common to experience some constipation if taking pain medication after surgery.  Increasing fluid intake and taking a stool softener will usually help or prevent this problem from occurring.  A mild laxative (Milk of Magnesia or Miralax) should be taken according to package directions if there are no bowel movements after 48 hours. °7. Unless discharge instructions indicate otherwise, you may remove your bandages 48 hours after surgery and you may shower at that time.  You may have steri-strips (small skin tapes) in place directly over the incision.   These strips should be left on the skin for 7-10 days and will come off on their own.  If your surgeon used skin glue on the incision, you may shower in 24 hours.  The glue will flake off over the next 2-3 weeks.  Any sutures or staples will be removed at the office during your follow-up visit. °8. ACTIVITIES:  You may resume regular daily activities (gradually increasing) beginning the next day.  Wearing a good support bra or sports bra minimizes pain and swelling.  You may have sexual intercourse when it is comfortable. °a. You may drive when you no longer are taking prescription pain medication, you can comfortably wear a seatbelt, and you can safely maneuver your car and apply brakes. °b. RETURN TO WORK:  ______________________________________________________________________________________ °9. You should see your doctor in the office for a follow-up appointment approximately two weeks after your surgery.  Your doctor’s nurse will typically make your follow-up appointment when she calls you with your pathology report.  Expect your pathology report 3-4 business days after your surgery.  You may call to check if you do not hear from us after three days. °10. OTHER INSTRUCTIONS: _______________________________________________________________________________________________ _____________________________________________________________________________________________________________________________________ °_____________________________________________________________________________________________________________________________________ °_____________________________________________________________________________________________________________________________________ ° °WHEN TO CALL DR WAKEFIELD: °1. Fever over 101.0 °2. Nausea and/or vomiting. °3. Extreme swelling or bruising. °4. Continued bleeding from incision. °5. Increased pain, redness, or drainage from the incision. ° °The clinic staff is available to  answer your questions during regular business hours.  Please don’t hesitate to call and ask to speak to one of the nurses for   clinical concerns.  If you have a medical emergency, go to the nearest emergency room or call 911.  A surgeon from [facility] is always on call at the hospital. ° °For further questions, please visit [email or web address] ° °Post Anesthesia Home Care Instructions ° °Activity: °Get plenty of rest for the remainder of the day. A responsible adult should stay with you for 24 hours following the procedure.  °For the next 24 hours, DO NOT: °-Drive a car °-Operate machinery °-Drink alcoholic beverages °-Take any medication unless instructed by your physician °-Make any legal decisions or sign important papers. ° °Meals: °Start with liquid foods such as gelatin or soup. Progress to regular foods as tolerated. Avoid greasy, spicy, heavy foods. If nausea and/or vomiting occur, drink only clear liquids until the nausea and/or vomiting subsides. Call your physician if vomiting continues. ° °Special Instructions/Symptoms: °Your throat may feel dry or sore from the anesthesia or the breathing tube placed in your throat during surgery. If this causes discomfort, gargle with warm salt water. The discomfort should disappear within 24 hours. ° °

## 2013-09-01 NOTE — Anesthesia Preprocedure Evaluation (Signed)
Anesthesia Evaluation  Patient identified by MRN, date of birth, ID band Patient awake    Reviewed: Allergy & Precautions, H&P , NPO status , Patient's Chart, lab work & pertinent test results  Airway Mallampati: I TM Distance: >3 FB Neck ROM: Full    Dental  (+) Teeth Intact, Dental Advisory Given   Pulmonary asthma , former smoker,  breath sounds clear to auscultation        Cardiovascular hypertension, Pt. on medications Rhythm:Regular Rate:Normal     Neuro/Psych    GI/Hepatic GERD-  Medicated and Controlled,  Endo/Other  Morbid obesity  Renal/GU      Musculoskeletal   Abdominal   Peds  Hematology   Anesthesia Other Findings   Reproductive/Obstetrics                           Anesthesia Physical Anesthesia Plan  ASA: III  Anesthesia Plan: General   Post-op Pain Management:    Induction: Intravenous  Airway Management Planned: LMA  Additional Equipment:   Intra-op Plan:   Post-operative Plan: Extubation in OR  Informed Consent: I have reviewed the patients History and Physical, chart, labs and discussed the procedure including the risks, benefits and alternatives for the proposed anesthesia with the patient or authorized representative who has indicated his/her understanding and acceptance.   Dental advisory given  Plan Discussed with: CRNA, Anesthesiologist and Surgeon  Anesthesia Plan Comments:         Anesthesia Quick Evaluation

## 2013-09-01 NOTE — Progress Notes (Signed)
Assisted Dr. Crews with right, ultrasound guided, pectoralis block. Side rails up, monitors on throughout procedure. See vital signs in flow sheet. Tolerated Procedure well. 

## 2013-09-01 NOTE — Anesthesia Postprocedure Evaluation (Signed)
  Anesthesia Post-op Note  Patient: Sydney Ford  Procedure(s) Performed: Procedure(s) with comments: BREAST LUMPECTOMY WITH RADIOACTIVE SEED LOCALIZATION (Right) RIGHT AXILLARY SENTINEL NODE BIOPSY (Right) - PECTORAL BLOCK WITH ANESTHESIA  REMOVAL PORT-A-CATH (Left)  Patient Location: PACU  Anesthesia Type:GA combined with regional for post-op pain  Level of Consciousness: awake, alert  and oriented  Airway and Oxygen Therapy: Patient Spontanous Breathing  Post-op Pain: mild  Post-op Assessment: Post-op Vital signs reviewed  Post-op Vital Signs: Reviewed  Last Vitals:  Filed Vitals:   09/01/13 1415  BP: 151/71  Pulse: 88  Temp:   Resp: 13    Complications: No apparent anesthesia complications

## 2013-09-01 NOTE — Interval H&P Note (Signed)
History and Physical Interval Note:  09/01/2013 11:45 AM  Sydney Ford  has presented today for surgery, with the diagnosis of right breast cancer  The various methods of treatment have been discussed with the patient and family. After consideration of risks, benefits and other options for treatment, the patient has consented to  Procedure(s) with comments: BREAST LUMPECTOMY WITH RADIOACTIVE SEED LOCALIZATION (Right) RIGHT AXILLARY SENTINEL NODE BIOPSY (Right) - PECTORAL BLOCK WITH ANESTHESIA  REMOVAL PORT-A-CATH (N/A) as a surgical intervention .  The patient's history has been reviewed, patient examined, no change in status, stable for surgery.  I have reviewed the patient's chart and labs.  Questions were answered to the patient's satisfaction.     WAKEFIELD,MATTHEW

## 2013-09-01 NOTE — Transfer of Care (Signed)
Immediate Anesthesia Transfer of Care Note  Patient: Sydney Ford  Procedure(s) Performed: Procedure(s) with comments: BREAST LUMPECTOMY WITH RADIOACTIVE SEED LOCALIZATION (Right) RIGHT AXILLARY SENTINEL NODE BIOPSY (Right) - PECTORAL BLOCK WITH ANESTHESIA  REMOVAL PORT-A-CATH (N/A)  Patient Location: PACU  Anesthesia Type:GA combined with regional for post-op pain  Level of Consciousness: awake, alert , oriented and patient cooperative  Airway & Oxygen Therapy: Patient Spontanous Breathing and Patient connected to face mask oxygen  Post-op Assessment: Report given to PACU RN, Post -op Vital signs reviewed and stable and Patient moving all extremities X 4  Post vital signs: Reviewed and stable  Complications: No apparent anesthesia complications

## 2013-09-02 ENCOUNTER — Encounter (HOSPITAL_BASED_OUTPATIENT_CLINIC_OR_DEPARTMENT_OTHER): Payer: Self-pay | Admitting: General Surgery

## 2013-09-07 ENCOUNTER — Telehealth (INDEPENDENT_AMBULATORY_CARE_PROVIDER_SITE_OTHER): Payer: Self-pay

## 2013-09-07 NOTE — Telephone Encounter (Signed)
Informed pt of Alisha's message below regarding path results and f/u appt. Pt verbalized understanding

## 2013-09-07 NOTE — Telephone Encounter (Signed)
LMOM on cell and n/a at home for pt to call me. Dr Donne Hazel has tried calling the pt multiple times with n/a at home. I want to tell the pt her pathology report is completely negative per Dr Donne Hazel. I want to tell her no more breast cancer that the chemo got rid of it in the lymph nodes per Dr Donne Hazel. I want to make the pt a f/u appt with Dr Donne Hazel. Pt needs to arrive on 6/30 at 4:30/4:40.

## 2013-09-08 ENCOUNTER — Ambulatory Visit: Payer: BC Managed Care – PPO

## 2013-09-13 ENCOUNTER — Telehealth (INDEPENDENT_AMBULATORY_CARE_PROVIDER_SITE_OTHER): Payer: Self-pay

## 2013-09-13 ENCOUNTER — Encounter (INDEPENDENT_AMBULATORY_CARE_PROVIDER_SITE_OTHER): Payer: Self-pay | Admitting: General Surgery

## 2013-09-13 ENCOUNTER — Ambulatory Visit (INDEPENDENT_AMBULATORY_CARE_PROVIDER_SITE_OTHER): Payer: BC Managed Care – PPO | Admitting: General Surgery

## 2013-09-13 VITALS — BP 130/78 | HR 100 | Temp 97.7°F | Ht 62.0 in | Wt 210.0 lb

## 2013-09-13 DIAGNOSIS — Z09 Encounter for follow-up examination after completed treatment for conditions other than malignant neoplasm: Secondary | ICD-10-CM

## 2013-09-13 NOTE — Progress Notes (Signed)
Subjective:     Patient ID: Sydney Ford, female   DOB: 09/20/1955, 58 y.o.   MRN: [id number]  HPI This is a 58 year old female who underwent primary chemotherapy for a triple negative right breast cancer. She basically had a complete resolution by MRI. She had a lot of difficulty with chemotherapy. She then underwent a radioactive seed guided excision of her prior sites with a sentinel lymph node biopsy. This pathology returns as benign breast tissue with no evidence of any cancer. Her radioactive seed was located in the one specimen I think is it migrated away from the clip a little bit. The clip was located to my second specimen. There is no cancer in either specimen. 3 sentinel lymph nodes are negative for any tumor. She is doing well without any complaints today and returns for postop appt  Review of Systems     Objective:   Physical Exam Healing left breast periareolar incision and left axillary incision without infection    Assessment:     S/p lump/snbx for tnbc     Plan:     She is doing well from surgery and I released her to full activity. She can begin radiation therapy whenever they see fit. I will see her back in 6 months for followup. I gave her a referral to the after breast cancer physical therapy class also.

## 2013-09-13 NOTE — Patient Instructions (Signed)
      [facility] CLASS After Breast Cancer Class  After Breast Cancer Class is a specially designed exercise class to assist you in a safe recovery after having breast cancer surgery.  In this class you will learn how to get back to full function whether your drains were just removed or if you had surgery a month ago.  This one-time class is held the 1st and 3rd Monday of every month from 11:00 a.m. until 12:00 noon at the [place] located at [facility].  This class is FREE and space is limited.  For more information or to register for the next available class, call [phone].  Class Goals   Understand specific stretches to improve the flexibility of your chest and shoulder.   Learn ways to safely strengthen your upper body and improve your posture.   Understand the warning signs of infection and why you may be at risk for an arm infection.   Learn about Lymphedema and prevention.  **You do not attend this class until after surgery.  Drains must be removed to participate.     Serafina Royals, PT, CLT Leone Payor, PT, CLT

## 2013-09-13 NOTE — Telephone Encounter (Signed)
LMOM letting pt know that I have had some cancelations this pm and I offered for the pt to come in at 2:45/3:00 if the wanted to. I did not change her appt time in case if the pt is not able to change. I was just calling to see if pt interested in moving up but if she can't then the appt will stay the same.

## 2013-10-12 ENCOUNTER — Ambulatory Visit (HOSPITAL_BASED_OUTPATIENT_CLINIC_OR_DEPARTMENT_OTHER): Payer: BC Managed Care – PPO | Admitting: Oncology

## 2013-10-12 ENCOUNTER — Other Ambulatory Visit: Payer: Self-pay | Admitting: *Deleted

## 2013-10-12 ENCOUNTER — Other Ambulatory Visit (HOSPITAL_BASED_OUTPATIENT_CLINIC_OR_DEPARTMENT_OTHER): Payer: BC Managed Care – PPO

## 2013-10-12 VITALS — BP 141/58 | HR 52 | Temp 98.2°F | Resp 18 | Ht 62.0 in | Wt 212.5 lb

## 2013-10-12 DIAGNOSIS — C50411 Malignant neoplasm of upper-outer quadrant of right female breast: Secondary | ICD-10-CM

## 2013-10-12 DIAGNOSIS — G620 Drug-induced polyneuropathy: Secondary | ICD-10-CM

## 2013-10-12 DIAGNOSIS — C50419 Malignant neoplasm of upper-outer quadrant of unspecified female breast: Secondary | ICD-10-CM

## 2013-10-12 DIAGNOSIS — Z170 Estrogen receptor positive status [ER+]: Secondary | ICD-10-CM

## 2013-10-12 DIAGNOSIS — M1990 Unspecified osteoarthritis, unspecified site: Secondary | ICD-10-CM

## 2013-10-12 DIAGNOSIS — N951 Menopausal and female climacteric states: Secondary | ICD-10-CM

## 2013-10-12 LAB — CBC WITH DIFFERENTIAL/PLATELET
BASO%: 0.3 % (ref 0.0–2.0)
BASOS ABS: 0 10*3/uL (ref 0.0–0.1)
EOS%: 0.2 % (ref 0.0–7.0)
Eosinophils Absolute: 0 10*3/uL (ref 0.0–0.5)
HCT: 37.5 % (ref 34.8–46.6)
HEMOGLOBIN: 12.4 g/dL (ref 11.6–15.9)
LYMPH#: 1.1 10*3/uL (ref 0.9–3.3)
LYMPH%: 18.2 % (ref 14.0–49.7)
MCH: 31 pg (ref 25.1–34.0)
MCHC: 33.1 g/dL (ref 31.5–36.0)
MCV: 93.8 fL (ref 79.5–101.0)
MONO#: 0.4 10*3/uL (ref 0.1–0.9)
MONO%: 7.2 % (ref 0.0–14.0)
NEUT#: 4.4 10*3/uL (ref 1.5–6.5)
NEUT%: 74.1 % (ref 38.4–76.8)
PLATELETS: 199 10*3/uL (ref 145–400)
RBC: 3.99 10*6/uL (ref 3.70–5.45)
RDW: 14.9 % — ABNORMAL HIGH (ref 11.2–14.5)
WBC: 6 10*3/uL (ref 3.9–10.3)

## 2013-10-12 LAB — COMPREHENSIVE METABOLIC PANEL (CC13)
ALT: 20 U/L (ref 0–55)
AST: 16 U/L (ref 5–34)
Albumin: 3.7 g/dL (ref 3.5–5.0)
Alkaline Phosphatase: 89 U/L (ref 40–150)
Anion Gap: 8 mEq/L (ref 3–11)
BILIRUBIN TOTAL: 0.43 mg/dL (ref 0.20–1.20)
BUN: 24.3 mg/dL (ref 7.0–26.0)
CALCIUM: 9.6 mg/dL (ref 8.4–10.4)
CHLORIDE: 104 meq/L (ref 98–109)
CO2: 29 mEq/L (ref 22–29)
CREATININE: 1 mg/dL (ref 0.6–1.1)
Glucose: 88 mg/dl (ref 70–140)
Potassium: 3.8 mEq/L (ref 3.5–5.1)
Sodium: 141 mEq/L (ref 136–145)
Total Protein: 6.5 g/dL (ref 6.4–8.3)

## 2013-10-12 MED ORDER — LORAZEPAM 0.5 MG PO TABS: 0.5000 mg | ORAL_TABLET | Freq: Every day | ORAL | Status: DC

## 2013-10-12 MED ORDER — TRAMADOL HCL 50 MG PO TABS: 50.0000 mg | ORAL_TABLET | Freq: Three times a day (TID) | ORAL | Status: DC | PRN

## 2013-10-12 NOTE — Progress Notes (Signed)
[place]  [phone] [phone]     ID: Sydney Ford OB: 05/19/1955  MR#: [id number]  RKY#:[id number]  PCP: Dwan Bolt, MD GYN:   SU: Rolm Bookbinder, MD OTHER MD: Ulyess Blossom, MD;  Franchot Gallo, MD  CHIEF COMPLAINT:  Right breast cancer/neoadjuvant chemotherapy   BREAST CANCER HISTORY: As per previously documented note:  Sydney Ford (pronounced "line 'em") herself noted a change in her right breast sometime October 2014 but "my breasts are like bean bags" and she initially ignored it. After while she began to feel a pole when she lay on her right side at night so she scheduled herself for screening mammography at the [facility] 02/08/2013 (prior mammogram had been April 2011), and this did show a possible mass in the right breast. Unilateral right diagnostic mammography and ultrasonography 02/28/2013 confirmed an irregular mass in the right breast upper outer quadrant measuring 2.4 cm. This was palpable at the 11:00 position. Ultrasound showed an irregular hypoechoic mass measuring 1.5 cm with no abnormal adenopathy in the right axilla.  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  The patient met with Dr. Donne Hazel 03/14/2013 and he recommended primary systemic chemotherapy to decrease the size of the tumor and increase the chance of lumpectomy.   Subsequent history is as detailed below.  INTERVAL HISTORY: Sydney Ford returns alone today for followup of her locally advanced right breast cancer. She has been receiving neoadjuvant chemotherapy. She completed 4 dose dense cycles of doxorubicin/cyclophosphamide, then proceeded with weekly carboplatin/paclitaxel. There were some delays in treatment due 2 recurrent kidney stones, and low  counts. She was due for her ninth of 12 planned weekly doses of carboplatin/paclitaxel last week on 08/18/2013. Treatment was held that time do to poor tolerance, along with low counts and the development of peripheral neuropathy.   Sydney Ford is here today for repeat labs and followup. She completed a course of Cipro last week for chemotherapy-induced neutropenia, and her counts have recovered well after the Neulasta injection 1 week ago. She did have a headache and bone pain for several days following the Neulasta injection, but fortunately these have both resolved. She has had some low-grade fevers, the highest reported temp being 100.1 last Saturday (08/20/2013). Since that time, she has had no fevers of 100 or above.  Sydney Ford had also developed a rash on her hands and face, thought to be secondary to the paclitaxel. This continues to improve, and has almost completely resolved.  She also had mild hypokalemia last week, and was started on a low dose potassium supplement, 10 mEq twice daily. Her potassium has normalized.   Sydney Ford received a blood transfusion of 2 units packed red blood cells last week, and her hemoglobin is recovering nicely. Her energy level is gradually improving, as is her shortness of breath.  Sydney Ford continues to have pressure with urination, although she denies actual dysuria. She has had no visible hematuria and notes a "good flow" of urine. She would rate her pain at approximately 3 on a scale of 1-10. She feels like she could "passed a kidney stone any time", and at this point does not feel it necessary to see Dr. Diona Fanti unless her pain increases. We did review her metabolic panel today, which did show an elevated serum creatinine of 1.5.   REVIEW OF SYSTEMS:  Sydney Ford continues to have hot flashes and night sweats, and often has difficulty sleeping. She still has fatigue, although it has improved slightly since receiving a blood transfusion last week. She denies any abnormal bruising or  bleeding. She continues to have significant tingling bilaterally in her toes, but has had no additional signs of neuropathy in the upper extremities. The neuropathy in her lower extremities is stable, with no significant improvement. She is eating and drinking fairly well. She has occasional queasiness, but has had no emesis. She's had no change in bowel or bladder habits. She's had no mouth ulcers or oral sensitivity. She's had no increased cough or phlegm production. She does have some shortness of breath with exertion which is stable. She's had no chest pain or palpitations. The headache following the Neulasta injection has now resolved, and she denies any dizziness or change in vision. The bone pain has also resolved, but she continues to have chronic joint pain associated with arthritis. This is stable.  Otherwise, detailed review of systems is stable and noncontributory.   PAST MEDICAL HISTORY: Past Medical History  Diagnosis Date  . Hypertension   . Hyperlipidemia   . Thyroid disease   . Osteoporosis   . Hypothyroidism     hashimoto's  . Depression   . Pneumonia     2014  . Cold (disease) 03/21/12    couple of weeks ago - pt states much better now  . History of kidney stones     "passed 9 stones"  . GERD (gastroesophageal reflux disease)     no meds except occas tums  . Headache(784.0)     occas migraine  . Cancer     new dx of right breast cancer - planning chemo first  . Arthritis     oa; lower back pain - hx of injury yrs ago-occas flare ups  . Anemia     2005 - related to heavy menses with the thyroid probl  . Chronic kidney disease     kidney stone    PAST SURGICAL HISTORY:  (Updated 07/07/2013) Past Surgical History  Procedure Laterality Date  . Cesarean section  1987  . Tonsillectomy    . Uterine ablation  2008    no menstrual periods since procedure  . Portacath placement N/A 03/23/2013    Procedure: INSERTION PORT-A-CATH;  Surgeon: Rolm Bookbinder, MD;   Location: [facility];  Service: General;  Laterality: N/A;  . Cystoscopy with retrograde pyelogram, ureteroscopy and stent placement Right 06/30/2013    Procedure: CYSTOSCOPY WITH RETROGRADE PYELOGRAM, URETEROSCOPY, STONE EXTRACTION AND  STENT PLACEMENT;  Surgeon: Franchot Gallo, MD;  Location: [facility];  Service: Urology;  Laterality: Right;  . Holmium laser application Right 9/41/7408    Procedure: HOLMIUM LASER APPLICATION;  Surgeon: Franchot Gallo, MD;  Location: [facility];  Service: Urology;  Laterality: Right;  . Breast lumpectomy with radioactive seed localization Right 09/01/2013    Procedure: BREAST LUMPECTOMY WITH RADIOACTIVE SEED LOCALIZATION;  Surgeon: Rolm Bookbinder, MD;  Location: [place];  Service: General;  Laterality: Right;  . Axillary sentinel node biopsy Right 09/01/2013    Procedure: RIGHT AXILLARY SENTINEL NODE BIOPSY;  Surgeon: Rolm Bookbinder, MD;  Location: [place];  Service: General;  Laterality: Right;  PECTORAL BLOCK WITH ANESTHESIA   . Port-a-cath removal Left 09/01/2013    Procedure: REMOVAL PORT-A-CATH;  Surgeon: Rolm Bookbinder, MD;  Location: [place];  Service: General;  Laterality: Left;    FAMILY HISTORY Family History  Problem Relation Age of Onset  . Heart failure Mother   . Heart attack Father   . Autoimmune disease Maternal Aunt     lupus, scelraderma, reynolds disease, and others  . Stroke Paternal Uncle   . Heart failure Maternal Grandmother   . Heart attack Maternal Grandfather   . Throat cancer Paternal Grandmother     dx late 78s; non smoker or drinker  . Heart attack Paternal Grandfather   . Schizophrenia Son 55  . Heart attack Paternal Uncle    the patient's father died at the age of 65 from a myocardial infarction. The patient's mother died at the age of 57 with congestive heart failure. She also had a history of lupus. The patient had no brothers or sisters. There is no history of breast  or ovarian cancer in the family to her knowledge.  GYNECOLOGIC HISTORY:   (reviewed on 08/25/2013)  Menarche age 72, first live birth age 70. She is GX P2. She underwent endometrial ablation in 2009 and has not had a period since that time. She never took hormone replacement. She did use birth control pills remotely with no complications  SOCIAL HISTORY: (Reviewd 08/25/2013)  Sydney Ford works at [employer] for a variety of long-term clients. She does not work for a company. Her husband [name] is semiretired. He works for a company that [employer] data on [place]. Son [name] is in [place] and works in Architect. Son [name] lives at home and is disabled with a diagnosis of paranoid schizophrenia. The patient has no grandchildren. She is not a church attender    ADVANCED DIRECTIVES: Not in place   HEALTH MAINTENANCE: ( reviewed 08/25/2013 ) History  Substance Use Topics  . Smoking status: Former Smoker    Quit date: 03/17/1985  . Smokeless tobacco: Never Used  . Alcohol Use: No     Colonoscopy: Never  PAP: Remote  Bone density: November 2014, showed osteoporosis   according to the patient's report  Lipid panel:  Not on file, Dr. Wilson Singer   Allergies  Allergen Reactions  . Codeine Itching and Nausea Only    REACTION: Nausea and itching  . Compazine [Prochlorperazine Edisylate] Other (See Comments)    Head aches   . Other     Close contact with Some recycled plastics cause whelps  . Tape Rash    Needs OPSITE dressing with port access    Current Outpatient Prescriptions  Medication Sig Dispense Refill  . atorvastatin (LIPITOR) 20 MG tablet Take 20 mg by mouth every evening.       Marland Kitchen lisinopril (PRINIVIL,ZESTRIL) 20 MG tablet Take 20 mg by mouth daily.      Marland Kitchen LORazepam (ATIVAN) 0.5 MG tablet Take 0.5 mg by mouth every 8 (eight) hours as needed (nausea).       . metoCLOPramide (REGLAN) 5 MG tablet Take 1 tablet (5 mg total) by mouth 3 (three) times daily  before meals.  90 tablet  4  . omeprazole (PRILOSEC) 40 MG capsule Take 40 mg by mouth daily.      . ondansetron (ZOFRAN) 8 MG tablet Take 1 tablet (8 mg total) by mouth every 8 (eight) hours as needed for nausea or vomiting.  20 tablet  1  . oxyCODONE-acetaminophen (PERCOCET) 10-325 MG per tablet Take 1 tablet by mouth every 6 (six) hours as needed for pain.  20 tablet  0  . promethazine (PHENERGAN) 25 MG suppository Place 1 suppository (25 mg total) rectally every  8 (eight) hours as needed for nausea or vomiting.  12 each  0  . sertraline (ZOLOFT) 100 MG tablet TAKE ONE TABLET BY MOUTH AT BEDTIME  30 tablet  0  . thyroid (ARMOUR) 90 MG tablet Take 90 mg by mouth every morning.      . traMADol (ULTRAM) 50 MG tablet Take 1-2 tablets (50-100 mg total) by mouth 3 (three) times daily as needed for moderate pain or severe pain.  100 tablet  0  . traZODone (DESYREL) 50 MG tablet Take 1 tablet (50 mg total) by mouth at bedtime as needed for sleep.  30 tablet  1   No current facility-administered medications for this visit.    PHYSICAL EXAM: Middle-aged Caucasian female who appears very weak and tired but is in no acute distress    Filed Vitals:   10/12/13 1012  BP: 141/58  Pulse: 52  Temp: 98.2 F (36.8 C)  Resp: 18     Body mass index is 38.86 kg/(m^2).    ECOG FS: 1 Filed Weights   10/12/13 1012  Weight: 212 lb 8 oz (96.389 kg)   Physical Exam: HEENT:  Sclerae anicteric. Conjunctivae pink. Oropharynx is pink and moist. No evidence of mucositis or oropharyngeal candidiasis. Neck is supple, trachea midline.  NODES:  No cervical or supraclavicular lymphadenopathy palpated.  BREAST EXAM: Breast exam was deferred today. Axillae are benign bilaterally, no palpable lymphadenopathy. LUNGS:  Clear to auscultation bilaterally.  No wheezes or rhonchi HEART:  Regular rate and rhythm. No murmur appreciated. ABDOMEN:  Soft, obese, nontender.  Positive bowel sounds.  MSK:  No focal spinal tenderness  to palpation. Full range of motion bilaterally in the upper extremities. No CVA tenderness. EXTREMITIES:  No peripheral edema.  No clubbing or cyanosis. SKIN: The previously noted erythema and rash on the face has almost completely resolved, with only very slight erythema noted on the forehead today. No pustules or vesicles noted. There are no additional rashes noted today. No excessive ecchymoses. No petechiae. No pallor. Port is intact in the chest wall with no erythema or edema, and no evidence of infection/cellulitis. NEURO:  Nonfocal. Well oriented. Appropriate affect.  STUDIES: Mr Breast Bilateral W Wo Contrast 08/17/2013   CLINICAL DATA:  58 year old female with known right breast cancer. Evaluate response to neoadjuvant chemotherapy.  LABS:  None obtained today.  EXAM: BILATERAL BREAST MRI WITH AND WITHOUT CONTRAST  TECHNIQUE: Multiplanar, multisequence MR images of both breasts were obtained prior to and following the intravenous administration of 56ml of MultiHance.  THREE-DIMENSIONAL MR IMAGE RENDERING ON INDEPENDENT WORKSTATION:  Three-dimensional MR images were rendered by post-processing of the original MR data on an independent workstation. The three-dimensional MR images were interpreted, and findings are reported in the following complete MRI report for this study. Three dimensional images were evaluated at the independent DynaCad workstation  COMPARISON:  05/17/2013 and 03/08/2013 breast MRs. Prior mammograms.  FINDINGS: Breast composition: b.  Scattered fibroglandular tissue.  Background parenchymal enhancement: Mild  Right breast: Minimal residual non masslike enhancement within the upper-outer right breast is identified in the area of known neoplasm and has slightly decreased since 05/17/2013.  No other abnormal enhancement within the right breast identified.  Left breast: No mass or abnormal enhancement.  Lymph nodes: No abnormal appearing lymph nodes.  Ancillary findings:  None.   IMPRESSION: Slight further treatment response with slightly decreased minimal residual non masslike enhancement within the upper-outer right breast, in the area of biopsy-proven neoplasm.  No MR evidence  of left breast neoplasm or abnormal lymph nodes.  RECOMMENDATION: Treatment plan  BI-RADS CATEGORY  6: Known biopsy-proven malignancy.   Electronically Signed   By: Hassan Rowan M.D.   On: 08/17/2013 14:27    LAB RESULTS:  Lab Results  Component Value Date   WBC 6.0 10/12/2013   NEUTROABS 4.4 10/12/2013   HGB 12.4 10/12/2013   HCT 37.5 10/12/2013   MCV 93.8 10/12/2013   PLT 199 10/12/2013      Chemistry      Component Value Date/Time   NA 141 10/12/2013 1001   NA 145 08/31/2013 1340   K 3.8 10/12/2013 1001   K 4.4 08/31/2013 1340   CL 105 08/31/2013 1340   CO2 29 10/12/2013 1001   CO2 26 08/31/2013 1340   BUN 24.3 10/12/2013 1001   BUN 16 08/31/2013 1340   CREATININE 1.0 10/12/2013 1001   CREATININE 1.20* 08/31/2013 1340      Component Value Date/Time   CALCIUM 9.6 10/12/2013 1001   CALCIUM 9.5 08/31/2013 1340   ALKPHOS 89 10/12/2013 1001   ALKPHOS 65 05/27/2013 0530   AST 16 10/12/2013 1001   AST 7 05/27/2013 0530   ALT 20 10/12/2013 1001   ALT 11 05/27/2013 0530   BILITOT 0.43 10/12/2013 1001   BILITOT 0.5 05/27/2013 0530       ASSESSMENT: 58 y.o. 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  (2) treated in the neoadjuvant setting,   (a) completed 4 dose dense cycles of doxorubicin/ cyclophosphamide 05/19/2013. Cycle 4 was delayed one week because of bad weather  (b) received 7 of 12 planned weekly doses of carboplatin/ paclitaxel, with paclitaxel given alone with the 8th cycle, after which chemotherapy was stopped because of cytopenias and neuropathy; final dose 08/04/2013.  (3) status post right lumpectomy and sentinel lymph node  sampling 09/01/2013 showing a complete pathologic response.  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 Diona Fanti  PLAN: [name] had the best possible response to her chemotherapy, and she understands this does predict a very good chance of disease-free long term survival. I wish I could remove the neuropathy symptoms, but the best we can do for now is to continue the gabapentin and some of the other ancillary measures she is trying. I am refilling her tramadol. At this point she would prefer not to use gabapentin because of somnolence issues.  With regards to the earwax cleaning she will check with her primary care physician whether or not she needs ENT referral.  I have encouraged her to start a regular exercise program, perhaps starting with yoga, and including some cardio. I think that will do more for her fatigue then any other intervention. It may also help with her insomnia. She does have lorazepam in hand and so low she does not take it more than twice a week at bedtime it should work well for her. More frequent use of course would induce tolerance.  We are now ready to startRadiation. We're getting that appointment made for her. Otherwise she will see me in about 3 months and we will start routine followup at that time. Sydney Ford has a good understanding of this plan. She agrees with that. She knows the goal of treatment in her case  this cure. She will call with any problems that may develop before her next visit here.  Chauncey Cruel, MD     10/12/2013

## 2013-10-17 NOTE — Addendum Note (Signed)
Addended by: Amelia Jo I on: 10/17/2013 01:45 PM   Modules accepted: Orders

## 2013-10-25 ENCOUNTER — Other Ambulatory Visit: Payer: Self-pay | Admitting: *Deleted

## 2013-10-25 DIAGNOSIS — C50411 Malignant neoplasm of upper-outer quadrant of right female breast: Secondary | ICD-10-CM

## 2013-10-28 ENCOUNTER — Encounter: Payer: Self-pay | Admitting: Radiation Oncology

## 2013-10-28 NOTE — Progress Notes (Signed)
Location of Breast Cancer: right, 11:00 o'clock, upper outer  Histology per Pathology Report:  09/01/13 Diagnosis 1. Breast, lumpectomy, Right green pin @ D8 - BENIGN BREAST TISSUE, SEE COMMENT. - NEGATIVE FOR ATYPIA OR MALIGNANCY. - SURGICAL MARGIN, NEGATIVE FOR ATYPIA OR MALIGNANCY. 2. Breast, lumpectomy, Right additional medial margin, yellow pin @ D5 - BENIGN BREAST TISSUE, SEE COMMENT. - NEGATIVE FOR ATYPIA OR MALIGNANCY. - SURGICAL MARGIN, NEGATIVE FOR ATYPIA OR MALIGNANCY. 3. Lymph node, sentinel, biopsy, Right axillary #1 - ONE LYMPH NODE, NEGATIVE FOR TUMOR (0/1). 4. Lymph node, sentinel, biopsy, Right axillary #2 - ONE LYMPH NODE, NEGATIVE FOR TUMOR (0/1). 5. Lymph node, sentinel, biopsy, Right axillary #3 - ONE LYMPH NODE, NEGATIVE FOR TUMOR (0/1).  02/28/13 Diagnosis Breast, right, needle core biopsy, 11 o'clock, 5 cm / nipple - INVASIVE MAMMARY CARCINOMA.  Receptor Status: ER(-), PR (-), Her2-neu (-)  Did patient present with symptoms (if so, please note symptoms) or was this found on screening mammography?: patient noticed right breast mass with tugging mid 2014  Past/Anticipated interventions by surgeon, if any: right lumpectomy, additional margin, nodes sampled, Dr Wakefield  Past/Anticipated interventions by medical oncology, if any: Chemotherapy - Dr Magrinat: ) treated in the neoadjuvant setting, completed 4 dose dense cycles of doxorubicin/ cyclophosphamide 05/19/2013. Received 7 of 12 planned weekly doses of carboplatin/ paclitaxel, with paclitaxel given alone with the 8th cycle, after which chemotherapy was stopped because of cytopenias and neuropathy; final dose 08/04/2013.  Lymphedema issues, if any:    no  Pain issues, if any:   no  SAFETY ISSUES:  Prior radiation? no  Pacemaker/ICD? no  Possible current pregnancy? no  Is the patient on methotrexate? no  Current Complaints / other details:  Married, works cleaning houses, 2 sons, 1 son is  disabled Menarche age 11, 1st live birth age 28, P 2, endometrial ablation 2009, no menstruation since then. No HRT.  No family history breast cancer.    Chesson, Mary Clare, RN 10/28/2013,9:48 AM   

## 2013-11-01 ENCOUNTER — Ambulatory Visit
Admission: RE | Admit: 2013-11-01 | Discharge: 2013-11-01 | Disposition: A | Discharge status: Home / Self Care | Payer: BC Managed Care – PPO | Source: Ambulatory Visit | Attending: Radiation Oncology | Admitting: Radiation Oncology

## 2013-11-01 ENCOUNTER — Encounter: Payer: Self-pay | Admitting: Radiation Oncology

## 2013-11-01 VITALS — BP 140/67 | HR 82 | Temp 98.2°F | Resp 20 | Wt 213.0 lb

## 2013-11-01 DIAGNOSIS — C50419 Malignant neoplasm of upper-outer quadrant of unspecified female breast: Secondary | ICD-10-CM | POA: Insufficient documentation

## 2013-11-01 DIAGNOSIS — Z87891 Personal history of nicotine dependence: Secondary | ICD-10-CM | POA: Diagnosis not present

## 2013-11-01 DIAGNOSIS — Z9221 Personal history of antineoplastic chemotherapy: Secondary | ICD-10-CM | POA: Diagnosis not present

## 2013-11-01 DIAGNOSIS — Z510 Encounter for antineoplastic radiation therapy: Secondary | ICD-10-CM | POA: Insufficient documentation

## 2013-11-01 DIAGNOSIS — I10 Essential (primary) hypertension: Secondary | ICD-10-CM | POA: Diagnosis not present

## 2013-11-01 DIAGNOSIS — E785 Hyperlipidemia, unspecified: Secondary | ICD-10-CM | POA: Diagnosis not present

## 2013-11-01 DIAGNOSIS — R5383 Other fatigue: Secondary | ICD-10-CM

## 2013-11-01 DIAGNOSIS — R5381 Other malaise: Secondary | ICD-10-CM | POA: Insufficient documentation

## 2013-11-01 DIAGNOSIS — C50411 Malignant neoplasm of upper-outer quadrant of right female breast: Secondary | ICD-10-CM

## 2013-11-01 DIAGNOSIS — E039 Hypothyroidism, unspecified: Secondary | ICD-10-CM | POA: Insufficient documentation

## 2013-11-01 DIAGNOSIS — R51 Headache: Secondary | ICD-10-CM | POA: Insufficient documentation

## 2013-11-01 HISTORY — DX: Malignant neoplasm of unspecified site of unspecified female breast: C50.919

## 2013-11-01 NOTE — Progress Notes (Signed)
Please see the Nurse Progress Note in the MD Initial Consult Encounter for this patient. 

## 2013-11-01 NOTE — Progress Notes (Signed)
Sydney Ford [facility] NEW PATIENT EVALUATION  Name: Sydney Ford MRN: [id number]  Date:   11/01/2013           DOB: 1955-06-15  Status: outpatient   CC: Dwan Bolt, MD  Magrinat, Virgie Dad, MD , Dr. Rolm Bookbinder   REFERRING PHYSICIAN: Magrinat, Virgie Dad, MD   DIAGNOSIS: Clinical stage IIA (T2 N0 M0), pathologic stage (ypT0, ypN0, M0) invasive ductal carcinoma of the right breast  HISTORY OF PRESENT ILLNESS:  [name] is a 58 y.o. female who is seen today through the courtesy of Dr. Jana Hakim for consideration of right breast radiation therapy in the management of her clinical stage TII N0 invasive ductal carcinoma the right breast. She states that she first noted a right breast mass last fall. Diagnostic mammography and ultrasound at the [facility] showed a 2.4 cm mass within the upper-outer quadrant at 11:00. Ultrasound-guided biopsies on 02/28/2013 were diagnostic for invasive mammary carcinoma. Ecadherin staining was positive for invasive ductal carcinoma. She was triple negative with a Ki-67 of 99%. Breast MR on December showed a 3 x 2 x 1.7 cm primary. There was no axillary adenopathy. She will onto receive neoadjuvant chemotherapy with Dr. Jana Hakim, 4 dose dense cycles of doxorubicin/cyclophosphamide and then weekly carboplatin/paclitaxel. She underwent a right partial mastectomy, and there is no evidence for residual disease. 3 sentinel lymph nodes were free of metastatic disease. She is doing well postoperatively.  PREVIOUS RADIATION THERAPY: No   PAST MEDICAL HISTORY:  has a past medical history of Hypertension; Hyperlipidemia; Thyroid disease; Osteoporosis; Hypothyroidism; Depression; Pneumonia; Cold (disease) (03/21/12); History of kidney stones; GERD (gastroesophageal reflux disease); Headache(784.0); Cancer; Arthritis; Anemia; Chronic kidney disease; and Breast cancer (02/28/13).     PAST SURGICAL HISTORY:  Past Surgical History   Procedure Laterality Date  . Cesarean section  1987  . Tonsillectomy    . Uterine ablation  2008    no menstrual periods since procedure  . Portacath placement N/A 03/23/2013    Procedure: INSERTION PORT-A-CATH;  Surgeon: Rolm Bookbinder, MD;  Location: [facility];  Service: General;  Laterality: N/A;  . Cystoscopy with retrograde pyelogram, ureteroscopy and stent placement Right 06/30/2013    Procedure: CYSTOSCOPY WITH RETROGRADE PYELOGRAM, URETEROSCOPY, STONE EXTRACTION AND  STENT PLACEMENT;  Surgeon: Franchot Gallo, MD;  Location: [facility];  Service: Urology;  Laterality: Right;  . Holmium laser application Right 5/39/7673    Procedure: HOLMIUM LASER APPLICATION;  Surgeon: Franchot Gallo, MD;  Location: [facility];  Service: Urology;  Laterality: Right;  . Breast lumpectomy with radioactive seed localization Right 09/01/2013    Procedure: BREAST LUMPECTOMY WITH RADIOACTIVE SEED LOCALIZATION;  Surgeon: Rolm Bookbinder, MD;  Location: [facility];  Service: General;  Laterality: Right;  . Axillary sentinel node biopsy Right 09/01/2013    Procedure: RIGHT AXILLARY SENTINEL NODE BIOPSY;  Surgeon: Rolm Bookbinder, MD;  Location: [facility];  Service: General;  Laterality: Right;  PECTORAL BLOCK WITH ANESTHESIA   . Port-a-cath removal Left 09/01/2013    Procedure: REMOVAL PORT-A-CATH;  Surgeon: Rolm Bookbinder, MD;  Location: [facility];  Service: General;  Laterality: Left;     FAMILY HISTORY: family history includes Autoimmune disease in her maternal aunt; Heart attack in her father, maternal grandfather, paternal grandfather, and paternal uncle; Heart failure in her maternal grandmother and mother; Schizophrenia (age of onset: 37) in her son; Stroke in her paternal uncle; Throat cancer in her paternal grandmother. Her father.a heart attack  in his early 3s. Her mother died from autoimmune disease and also COPD in her early 12s. No family history of  breast cancer.   SOCIAL HISTORY:  reports that she quit smoking about 28 years ago. She has never used smokeless tobacco. She reports that she does not drink alcohol or use illicit drugs. She previously taught elementary school, but now works as a Engineer, building services. Married, 2 children, both on disability, one with schizophrenia and the other with autoimmune uveitis. They both live at home with her.   ALLERGIES: Codeine; Compazine; Other; and Tape   MEDICATIONS:  Current Outpatient Prescriptions  Medication Sig Dispense Refill  . atorvastatin (LIPITOR) 20 MG tablet Take 20 mg by mouth every evening.       . Cholecalciferol (VITAMIN D3) 3000 UNITS TABS Take by mouth.      Marland Kitchen lisinopril (PRINIVIL,ZESTRIL) 20 MG tablet Take 20 mg by mouth daily.      . Magnesium 250 MG TABS Take by mouth.      . Melatonin 3 MG TABS Take by mouth.      . Potassium Gluconate 595 MG CAPS Take by mouth.      . thyroid (ARMOUR) 90 MG tablet Take 90 mg by mouth every morning.      . traMADol (ULTRAM) 50 MG tablet Take 1-2 tablets (50-100 mg total) by mouth 3 (three) times daily as needed for moderate pain or severe pain.  100 tablet  0   No current facility-administered medications for this encounter.     REVIEW OF SYSTEMS:  Pertinent items are noted in HPI.    PHYSICAL EXAM:  weight is 213 lb (96.616 kg). Her oral temperature is 98.2 F (36.8 C). Her blood pressure is 140/67 and her pulse is 82. Her respiration is 20.   Alert and oriented 58 year old white female appearing her stated age. Head and neck examination: Remarkable for regrowth of hair. Nodes: Without palpable cervical, supraclavicular, or axillary lymphadenopathy. Chest: Lungs clear.: There is a periareolar wound along the superior aspect of the areola from 11-1:00. No masses are appreciated. Left breast without masses or lesions. Abdomen without hepatomegaly. Extremities: Without edema.   LABORATORY DATA:  Lab Results  Component Value Date   WBC  6.0 10/12/2013   HGB 12.4 10/12/2013   HCT 37.5 10/12/2013   MCV 93.8 10/12/2013   PLT 199 10/12/2013   Lab Results  Component Value Date   NA 141 10/12/2013   K 3.8 10/12/2013   CL 105 08/31/2013   CO2 29 10/12/2013   Lab Results  Component Value Date   ALT 20 10/12/2013   AST 16 10/12/2013   ALKPHOS 89 10/12/2013   BILITOT 0.43 10/12/2013      IMPRESSION: Clinical stage T2, N0, M0 invasive ductal carcinoma with a complete response following neoadjuvant chemotherapy. Her prognosis appears to be excellent. I explained to the patient that her local treatment options include mastectomy versus partial mastectomy followed by radiation therapy. Since she had neoadjuvant chemotherapy, she is not a good candidate for hypo-fractionated radiation therapy. I do recommend 5 weeks of radiation therapy directed to the right breast. We discussed the potential acute and late toxicities of radiation therapy. Consent is signed today. She will return for simulation/treatment planning in the near future.   PLAN: As discussed above.  I spent 45  minutes face to face with the patient and more than 50% of that time was spent in counseling and/or coordination of care.

## 2013-11-01 NOTE — Addendum Note (Signed)
Encounter addended by: Andria Rhein, RN on: 11/01/2013  2:13 PM<BR>     Documentation filed: Charges VN

## 2013-11-08 ENCOUNTER — Ambulatory Visit
Admission: RE | Admit: 2013-11-08 | Discharge: 2013-11-08 | Disposition: A | Discharge status: Home / Self Care | Payer: BC Managed Care – PPO | Source: Ambulatory Visit | Attending: Radiation Oncology | Admitting: Radiation Oncology

## 2013-11-08 DIAGNOSIS — Z510 Encounter for antineoplastic radiation therapy: Secondary | ICD-10-CM | POA: Diagnosis not present

## 2013-11-08 DIAGNOSIS — C50411 Malignant neoplasm of upper-outer quadrant of right female breast: Secondary | ICD-10-CM

## 2013-11-08 NOTE — Progress Notes (Signed)
Complex simulation/treatment planning note:  The patient was taken to the CT simulator. She was placed supine. A Vac lock immobilization device was constructed on a custom breast board for immobilization. Right breast was marked with radiopaque wires. Her partial mastectomy scar was also marked. She was then scanned. The CT data set was sent to the planning system I contoured her tumor bed. She is set up to medial and lateral right breast tangents with unique multileaf collimators. She is now ready for 3-D simulation. I prescribing 5000 cGy 25 sessions. No boost.

## 2013-11-10 ENCOUNTER — Encounter: Payer: Self-pay | Admitting: Radiation Oncology

## 2013-11-10 DIAGNOSIS — Z510 Encounter for antineoplastic radiation therapy: Secondary | ICD-10-CM | POA: Diagnosis not present

## 2013-11-10 NOTE — Progress Notes (Signed)
3 simulation note: The patient was set up to medial and lateral right breast tangents. 3 separate and unique multileaf collimators were used for each field for a total of 6 complex treatment devices. She's treated with mixed 6 MV/15 MV photons. I prescribing 5000 cGy in 25 sessions. Dose volume histograms were obtained for the target structures in addition to avoidance structures including the lungs and heart. We met our departmental guidelines.

## 2013-11-14 DIAGNOSIS — Z510 Encounter for antineoplastic radiation therapy: Secondary | ICD-10-CM | POA: Diagnosis not present

## 2013-11-15 ENCOUNTER — Ambulatory Visit
Admission: RE | Admit: 2013-11-15 | Discharge: 2013-11-15 | Disposition: A | Discharge status: Home / Self Care | Payer: BC Managed Care – PPO | Source: Ambulatory Visit | Attending: Radiation Oncology | Admitting: Radiation Oncology

## 2013-11-15 DIAGNOSIS — C50411 Malignant neoplasm of upper-outer quadrant of right female breast: Secondary | ICD-10-CM

## 2013-11-15 DIAGNOSIS — Z510 Encounter for antineoplastic radiation therapy: Secondary | ICD-10-CM | POA: Diagnosis not present

## 2013-11-15 NOTE — Progress Notes (Signed)
Simulation verification note: The patient underwent simulation verification for treatment to her right breast. Her isocenter is in good position and the multileaf collimators contoured the treatment volume appropriately.

## 2013-11-16 ENCOUNTER — Ambulatory Visit
Admission: RE | Admit: 2013-11-16 | Discharge: 2013-11-16 | Disposition: A | Discharge status: Home / Self Care | Payer: BC Managed Care – PPO | Source: Ambulatory Visit | Attending: Radiation Oncology | Admitting: Radiation Oncology

## 2013-11-16 DIAGNOSIS — Z510 Encounter for antineoplastic radiation therapy: Secondary | ICD-10-CM | POA: Diagnosis not present

## 2013-11-16 DIAGNOSIS — C50411 Malignant neoplasm of upper-outer quadrant of right female breast: Secondary | ICD-10-CM

## 2013-11-16 MED ORDER — ALRA NON-METALLIC DEODORANT (RAD-ONC)
1.0000 "application " | Freq: Once | TOPICAL | Status: AC
  Administered 2013-11-16: 1 via TOPICAL

## 2013-11-16 MED ORDER — RADIAPLEXRX EX GEL
Freq: Once | CUTANEOUS | Status: AC
  Administered 2013-11-16: 09:00:00 via TOPICAL

## 2013-11-16 NOTE — Progress Notes (Signed)
Patient education completed with patient. Gave pt "Radiation and You" booklet w/all pertinent information marked and discussed, re: fatigue, skin irritation/management, nutrition, pain. Gave pt Radiaplex, Alra with instructions for proper use. Pt verbalized understanding.

## 2013-11-17 ENCOUNTER — Ambulatory Visit
Admission: RE | Admit: 2013-11-17 | Discharge: 2013-11-17 | Disposition: A | Discharge status: Home / Self Care | Payer: BC Managed Care – PPO | Source: Ambulatory Visit | Attending: Radiation Oncology | Admitting: Radiation Oncology

## 2013-11-17 DIAGNOSIS — Z510 Encounter for antineoplastic radiation therapy: Secondary | ICD-10-CM | POA: Diagnosis not present

## 2013-11-18 ENCOUNTER — Ambulatory Visit
Admission: RE | Admit: 2013-11-18 | Discharge: 2013-11-18 | Disposition: A | Discharge status: Home / Self Care | Payer: BC Managed Care – PPO | Source: Ambulatory Visit | Attending: Radiation Oncology | Admitting: Radiation Oncology

## 2013-11-18 DIAGNOSIS — Z510 Encounter for antineoplastic radiation therapy: Secondary | ICD-10-CM | POA: Diagnosis not present

## 2013-11-22 ENCOUNTER — Ambulatory Visit
Admission: RE | Admit: 2013-11-22 | Discharge: 2013-11-22 | Disposition: A | Discharge status: Home / Self Care | Payer: BC Managed Care – PPO | Source: Ambulatory Visit | Attending: Radiation Oncology | Admitting: Radiation Oncology

## 2013-11-22 VITALS — BP 141/68 | HR 83 | Temp 98.4°F | Resp 20 | Wt 222.0 lb

## 2013-11-22 DIAGNOSIS — Z510 Encounter for antineoplastic radiation therapy: Secondary | ICD-10-CM | POA: Diagnosis not present

## 2013-11-22 DIAGNOSIS — C50411 Malignant neoplasm of upper-outer quadrant of right female breast: Secondary | ICD-10-CM

## 2013-11-22 NOTE — Progress Notes (Addendum)
Patient denies pain, loss of appetite. She reports fatigue, states she slept all day yesterday. She has history of insomnia, takes Melatonin nightly. She also took Tramadol 2 tabs last night due to neuropathy in her feet. She states she hopes that is the cause of her fatigue this morning, the combination of both medications. She is applying Radiplex to right breast, no skin changes at this time.

## 2013-11-22 NOTE — Progress Notes (Signed)
Weekly Management Note:  Site:Right Breast Current Dose:  800  cGy Projected Dose: 5000  cGy  Narrative: The patient is seen today for routine under treatment assessment. CBCT/MVCT images/port films were reviewed. The chart was reviewed.   She is generally doing well however, she is having moderate to severe fatigue beginning last week. She also has low-grade constant headaches. She denies nausea, vomiting, change in vision or any lateralizing neurologic symptoms/signs. She has a history triple negative breast cancer. She admits that she has been more active over the past 1-2 weeks in may simply be a tired from her activities.  Physical Examination:  Filed Vitals:   11/22/13 0809  BP: 141/68  Pulse: 83  Temp: 98.4 F (36.9 C)  Resp: 20  .  Weight: 222 lb (100.699 kg). No significant skin changes along the right breast. Neurologic examination is grossly nonfocal.  Impression: Tolerating radiation therapy well. I discussed with her the remote possibility she may have metastatic disease to brain to explain her fatigue and headaches. We'll monitor this for a few days. If she is not improve, we may need to obtain a brain scan  Plan: Continue radiation therapy as planned.

## 2013-11-23 ENCOUNTER — Ambulatory Visit
Admission: RE | Admit: 2013-11-23 | Discharge: 2013-11-23 | Disposition: A | Discharge status: Home / Self Care | Payer: BC Managed Care – PPO | Source: Ambulatory Visit | Attending: Radiation Oncology | Admitting: Radiation Oncology

## 2013-11-23 DIAGNOSIS — Z510 Encounter for antineoplastic radiation therapy: Secondary | ICD-10-CM | POA: Diagnosis not present

## 2013-11-24 ENCOUNTER — Ambulatory Visit
Admission: RE | Admit: 2013-11-24 | Discharge: 2013-11-24 | Disposition: A | Discharge status: Home / Self Care | Payer: BC Managed Care – PPO | Source: Ambulatory Visit | Attending: Radiation Oncology | Admitting: Radiation Oncology

## 2013-11-24 DIAGNOSIS — Z510 Encounter for antineoplastic radiation therapy: Secondary | ICD-10-CM | POA: Diagnosis not present

## 2013-11-25 ENCOUNTER — Ambulatory Visit
Admission: RE | Admit: 2013-11-25 | Discharge: 2013-11-25 | Disposition: A | Discharge status: Home / Self Care | Payer: BC Managed Care – PPO | Source: Ambulatory Visit | Attending: Radiation Oncology | Admitting: Radiation Oncology

## 2013-11-25 DIAGNOSIS — Z510 Encounter for antineoplastic radiation therapy: Secondary | ICD-10-CM | POA: Diagnosis not present

## 2013-11-28 ENCOUNTER — Encounter: Payer: Self-pay | Admitting: Radiation Oncology

## 2013-11-28 ENCOUNTER — Ambulatory Visit
Admission: RE | Admit: 2013-11-28 | Discharge: 2013-11-28 | Disposition: A | Discharge status: Home / Self Care | Payer: BC Managed Care – PPO | Source: Ambulatory Visit | Attending: Radiation Oncology | Admitting: Radiation Oncology

## 2013-11-28 VITALS — BP 146/77 | HR 74 | Temp 97.8°F | Resp 20 | Wt 227.3 lb

## 2013-11-28 DIAGNOSIS — C50411 Malignant neoplasm of upper-outer quadrant of right female breast: Secondary | ICD-10-CM

## 2013-11-28 DIAGNOSIS — Z510 Encounter for antineoplastic radiation therapy: Secondary | ICD-10-CM | POA: Diagnosis not present

## 2013-11-28 NOTE — Progress Notes (Signed)
Patient is without c/o today, states she rested well and feels good. She denies pain, fatigue, loss of appetite. She is applying Radiaplex to right breast, no skin changes at this time.

## 2013-11-28 NOTE — Progress Notes (Signed)
Weekly Management Note:  Site: Right breast Current Dose:  1600  cGy Projected Dose: 5000  CGy, no boost  Narrative: The patient is seen today for routine under treatment assessment. CBCT/MVCT images/port films were reviewed. The chart was reviewed.   She is without complaints today. No skin changes reported. She uses Radioplex gel.  Physical Examination:  Filed Vitals:   11/28/13 0815  BP: 146/77  Pulse: 74  Temp: 97.8 F (36.6 C)  Resp: 20  .  Weight: 227 lb 4.8 oz (103.103 kg). No change.  Impression: Tolerating radiation therapy well.  Plan: Continue radiation therapy as planned.

## 2013-11-29 ENCOUNTER — Ambulatory Visit
Admission: RE | Admit: 2013-11-29 | Discharge: 2013-11-29 | Disposition: A | Discharge status: Home / Self Care | Payer: BC Managed Care – PPO | Source: Ambulatory Visit | Attending: Radiation Oncology | Admitting: Radiation Oncology

## 2013-11-29 DIAGNOSIS — Z510 Encounter for antineoplastic radiation therapy: Secondary | ICD-10-CM | POA: Diagnosis not present

## 2013-11-30 ENCOUNTER — Ambulatory Visit
Admission: RE | Admit: 2013-11-30 | Discharge: 2013-11-30 | Disposition: A | Discharge status: Home / Self Care | Payer: BC Managed Care – PPO | Source: Ambulatory Visit | Attending: Radiation Oncology | Admitting: Radiation Oncology

## 2013-11-30 DIAGNOSIS — Z510 Encounter for antineoplastic radiation therapy: Secondary | ICD-10-CM | POA: Diagnosis not present

## 2013-12-01 ENCOUNTER — Ambulatory Visit
Admission: RE | Admit: 2013-12-01 | Discharge: 2013-12-01 | Disposition: A | Discharge status: Home / Self Care | Payer: BC Managed Care – PPO | Source: Ambulatory Visit | Attending: Radiation Oncology | Admitting: Radiation Oncology

## 2013-12-01 DIAGNOSIS — Z510 Encounter for antineoplastic radiation therapy: Secondary | ICD-10-CM | POA: Diagnosis not present

## 2013-12-02 ENCOUNTER — Ambulatory Visit
Admission: RE | Admit: 2013-12-02 | Discharge: 2013-12-02 | Disposition: A | Discharge status: Home / Self Care | Payer: BC Managed Care – PPO | Source: Ambulatory Visit | Attending: Radiation Oncology | Admitting: Radiation Oncology

## 2013-12-02 DIAGNOSIS — Z510 Encounter for antineoplastic radiation therapy: Secondary | ICD-10-CM | POA: Diagnosis not present

## 2013-12-05 ENCOUNTER — Ambulatory Visit
Admission: RE | Admit: 2013-12-05 | Discharge: 2013-12-05 | Disposition: A | Discharge status: Home / Self Care | Payer: BC Managed Care – PPO | Source: Ambulatory Visit | Attending: Radiation Oncology | Admitting: Radiation Oncology

## 2013-12-05 ENCOUNTER — Encounter: Payer: Self-pay | Admitting: Radiation Oncology

## 2013-12-05 VITALS — BP 136/73 | HR 71 | Temp 98.5°F | Resp 20 | Wt 225.6 lb

## 2013-12-05 DIAGNOSIS — Z510 Encounter for antineoplastic radiation therapy: Secondary | ICD-10-CM | POA: Diagnosis not present

## 2013-12-05 DIAGNOSIS — C50411 Malignant neoplasm of upper-outer quadrant of right female breast: Secondary | ICD-10-CM

## 2013-12-05 NOTE — Progress Notes (Signed)
Patient denies pain, loss of appetite. She is unsure if she has fatigue from radiation or work. She is applying Radiaplex to right breast treatment area, hyperpigmentation in axilla.

## 2013-12-05 NOTE — Progress Notes (Signed)
Weekly Management Note:  Site: Right breast Current Dose:  2600  cGy Projected Dose: 5000  cGy  Narrative: The patient is seen today for routine under treatment assessment. CBCT/MVCT images/port films were reviewed. The chart was reviewed.   She does have mild fatigue. She uses Radioplex gel. She is without complaints today but does admit to some irritation along her lower axilla and inframammary region.  Physical Examination:  Filed Vitals:   12/05/13 0801  BP: 136/73  Pulse: 71  Temp: 98.5 F (36.9 C)  Resp: 20  .  Weight: 225 lb 9.6 oz (102.331 kg). There is mild to moderate erythema along the lower right axilla and inframammary region with no areas of desquamation.  Impression: Tolerating radiation therapy well.  Plan: Continue radiation therapy as planned.

## 2013-12-06 ENCOUNTER — Ambulatory Visit
Admission: RE | Admit: 2013-12-06 | Discharge: 2013-12-06 | Disposition: A | Discharge status: Home / Self Care | Payer: BC Managed Care – PPO | Source: Ambulatory Visit | Attending: Radiation Oncology | Admitting: Radiation Oncology

## 2013-12-06 DIAGNOSIS — Z510 Encounter for antineoplastic radiation therapy: Secondary | ICD-10-CM | POA: Diagnosis not present

## 2013-12-07 ENCOUNTER — Ambulatory Visit
Admission: RE | Admit: 2013-12-07 | Discharge: 2013-12-07 | Disposition: A | Discharge status: Home / Self Care | Payer: BC Managed Care – PPO | Source: Ambulatory Visit | Attending: Radiation Oncology | Admitting: Radiation Oncology

## 2013-12-07 DIAGNOSIS — Z510 Encounter for antineoplastic radiation therapy: Secondary | ICD-10-CM | POA: Diagnosis not present

## 2013-12-08 ENCOUNTER — Ambulatory Visit
Admission: RE | Admit: 2013-12-08 | Discharge: 2013-12-08 | Disposition: A | Discharge status: Home / Self Care | Payer: BC Managed Care – PPO | Source: Ambulatory Visit | Attending: Radiation Oncology | Admitting: Radiation Oncology

## 2013-12-08 DIAGNOSIS — Z510 Encounter for antineoplastic radiation therapy: Secondary | ICD-10-CM | POA: Diagnosis not present

## 2013-12-09 ENCOUNTER — Ambulatory Visit
Admission: RE | Admit: 2013-12-09 | Discharge: 2013-12-09 | Disposition: A | Discharge status: Home / Self Care | Payer: BC Managed Care – PPO | Source: Ambulatory Visit | Attending: Radiation Oncology | Admitting: Radiation Oncology

## 2013-12-09 DIAGNOSIS — Z510 Encounter for antineoplastic radiation therapy: Secondary | ICD-10-CM | POA: Diagnosis not present

## 2013-12-12 ENCOUNTER — Ambulatory Visit
Admission: RE | Admit: 2013-12-12 | Discharge: 2013-12-12 | Disposition: A | Discharge status: Home / Self Care | Payer: BC Managed Care – PPO | Source: Ambulatory Visit | Attending: Radiation Oncology | Admitting: Radiation Oncology

## 2013-12-12 ENCOUNTER — Encounter: Payer: Self-pay | Admitting: Radiation Oncology

## 2013-12-12 VITALS — BP 131/84 | HR 92 | Temp 98.3°F | Resp 20 | Wt 225.5 lb

## 2013-12-12 DIAGNOSIS — Z510 Encounter for antineoplastic radiation therapy: Secondary | ICD-10-CM | POA: Diagnosis not present

## 2013-12-12 DIAGNOSIS — C50411 Malignant neoplasm of upper-outer quadrant of right female breast: Secondary | ICD-10-CM

## 2013-12-12 NOTE — Progress Notes (Signed)
Patient denies pain, loss of appetite. She reports ongoing fatigue, redness of right breast with some itching. She is applying Cortisone 1% with relief and Radiaplex twice daily.

## 2013-12-12 NOTE — Progress Notes (Signed)
Weekly Management Note:  Site: Right breast Current Dose:  3600  cGy Projected Dose: 5000  CGy, no boost  Narrative: The patient is seen today for routine under treatment assessment. CBCT/MVCT images/port films were reviewed. The chart was reviewed.   She has been using Radioplex gel and also hydrocortisone 1% for pruritus. She is having more discomfort along her axilla and inframammary region.  Physical Examination:  Filed Vitals:   12/12/13 0811  BP: 131/84  Pulse: 92  Temp: 98.3 F (36.8 C)  Resp: 20  .  Weight: 225 lb 8 oz (102.286 kg). There is moderate erythema of the skin, particularly along the upper breast. There is dry desquamation along the axilla and inframammary region. She may develop a moist desquamation.  Impression: Tolerating radiation therapy well.  Plan: Continue radiation therapy as planned.

## 2013-12-13 ENCOUNTER — Ambulatory Visit
Admission: RE | Admit: 2013-12-13 | Discharge: 2013-12-13 | Disposition: A | Discharge status: Home / Self Care | Payer: BC Managed Care – PPO | Source: Ambulatory Visit | Attending: Radiation Oncology | Admitting: Radiation Oncology

## 2013-12-13 DIAGNOSIS — Z510 Encounter for antineoplastic radiation therapy: Secondary | ICD-10-CM | POA: Diagnosis not present

## 2013-12-14 ENCOUNTER — Ambulatory Visit
Admission: RE | Admit: 2013-12-14 | Discharge: 2013-12-14 | Disposition: A | Discharge status: Home / Self Care | Payer: BC Managed Care – PPO | Source: Ambulatory Visit | Attending: Radiation Oncology | Admitting: Radiation Oncology

## 2013-12-14 DIAGNOSIS — Z510 Encounter for antineoplastic radiation therapy: Secondary | ICD-10-CM | POA: Diagnosis not present

## 2013-12-15 ENCOUNTER — Ambulatory Visit
Admission: RE | Admit: 2013-12-15 | Discharge: 2013-12-15 | Disposition: A | Discharge status: Home / Self Care | Payer: BC Managed Care – PPO | Source: Ambulatory Visit | Attending: Radiation Oncology | Admitting: Radiation Oncology

## 2013-12-15 DIAGNOSIS — N6459 Other signs and symptoms in breast: Secondary | ICD-10-CM | POA: Insufficient documentation

## 2013-12-15 DIAGNOSIS — C50911 Malignant neoplasm of unspecified site of right female breast: Secondary | ICD-10-CM | POA: Diagnosis not present

## 2013-12-15 DIAGNOSIS — L598 Other specified disorders of the skin and subcutaneous tissue related to radiation: Secondary | ICD-10-CM | POA: Diagnosis not present

## 2013-12-15 DIAGNOSIS — R234 Changes in skin texture: Secondary | ICD-10-CM | POA: Diagnosis not present

## 2013-12-15 DIAGNOSIS — Z510 Encounter for antineoplastic radiation therapy: Secondary | ICD-10-CM | POA: Diagnosis not present

## 2013-12-15 NOTE — Progress Notes (Signed)
Patient in nursing prior to treatment requesting her "hemoglobin be checked". Informed her that her Hgb would have to be drawn in lab. She states she "feels so weak and tired". She states she has history of hgb "6.4" when she had uterine fibroids. She states she also had low hgb during chemotherapy. Offered to check pt's VS and/or have Dr Valere Dross order CBC on her; pt declined stating "I'll see Dr Valere Dross on Monday." She denied pain, bleeding. Encouraged Ms Dray to call this RN if she changed her mind re: labs. Informed her this RN would relay this information to Dr Valere Dross today. She verbalized understanding, agreement.

## 2013-12-16 ENCOUNTER — Ambulatory Visit
Admission: RE | Admit: 2013-12-16 | Discharge: 2013-12-16 | Disposition: A | Discharge status: Home / Self Care | Payer: BC Managed Care – PPO | Source: Ambulatory Visit | Attending: Radiation Oncology | Admitting: Radiation Oncology

## 2013-12-16 DIAGNOSIS — Z510 Encounter for antineoplastic radiation therapy: Secondary | ICD-10-CM | POA: Diagnosis not present

## 2013-12-19 ENCOUNTER — Ambulatory Visit
Admission: RE | Admit: 2013-12-19 | Discharge: 2013-12-19 | Disposition: A | Discharge status: Home / Self Care | Payer: BC Managed Care – PPO | Source: Ambulatory Visit | Attending: Radiation Oncology | Admitting: Radiation Oncology

## 2013-12-19 ENCOUNTER — Encounter: Payer: Self-pay | Admitting: Radiation Oncology

## 2013-12-19 VITALS — BP 131/79 | HR 104 | Temp 98.4°F | Resp 20 | Wt 226.1 lb

## 2013-12-19 DIAGNOSIS — C50411 Malignant neoplasm of upper-outer quadrant of right female breast: Secondary | ICD-10-CM

## 2013-12-19 DIAGNOSIS — Z510 Encounter for antineoplastic radiation therapy: Secondary | ICD-10-CM | POA: Diagnosis not present

## 2013-12-19 NOTE — Progress Notes (Signed)
Weekly Management Note:  Site: Right breast Current Dose:  4600  cGy Projected Dose: 5000  CGy, no boost  Narrative: The patient is seen today for routine under treatment assessment. CBCT/MVCT images/port films were reviewed. The chart was reviewed.   She reports moderate pain along the inframammary region and also slight discomfort along her right axilla. She uses Radioplex gel. She will finish her radiation therapy this Wednesday.  Physical Examination:  Filed Vitals:   12/19/13 0814  BP: 131/79  Pulse: 104  Temp: 98.4 F (36.9 C)  Resp: 20  .  Weight: 226 lb 1.6 oz (102.558 kg). There is moderate erythema along the entire right breast with dry desquamation along the right inframammary region with impending moist desquamation.  Impression: Tolerating radiation therapy well. She'll finish her treatment this Wednesday.  Plan: Continue radiation therapy as planned. One-month followup after completion of radiation therapy.

## 2013-12-19 NOTE — Progress Notes (Signed)
Patient reports skin pain, discomfort due to treatment. She has moist desquamation under her right breast, hyperpigmentation. She is applying Radiaplex lotion; advised she may need to apply antibiotic ointment under breast per Dr Charlton Amor instruction today. She states resting over weekend has helped her fatigue. She completes Wed, gave her 1 month FU card.

## 2013-12-19 NOTE — Addendum Note (Signed)
Encounter addended by: Andria Rhein, RN on: 12/19/2013  8:52 AM<BR>     Documentation filed: Inpatient Document Flowsheet

## 2013-12-20 ENCOUNTER — Ambulatory Visit
Admission: RE | Admit: 2013-12-20 | Discharge: 2013-12-20 | Disposition: A | Discharge status: Home / Self Care | Payer: BC Managed Care – PPO | Source: Ambulatory Visit | Attending: Radiation Oncology | Admitting: Radiation Oncology

## 2013-12-20 DIAGNOSIS — Z510 Encounter for antineoplastic radiation therapy: Secondary | ICD-10-CM | POA: Diagnosis not present

## 2013-12-21 ENCOUNTER — Encounter: Payer: Self-pay | Admitting: Radiation Oncology

## 2013-12-21 ENCOUNTER — Ambulatory Visit
Admission: RE | Admit: 2013-12-21 | Discharge: 2013-12-21 | Disposition: A | Discharge status: Home / Self Care | Payer: BC Managed Care – PPO | Source: Ambulatory Visit | Attending: Radiation Oncology | Admitting: Radiation Oncology

## 2013-12-21 DIAGNOSIS — Z510 Encounter for antineoplastic radiation therapy: Secondary | ICD-10-CM | POA: Diagnosis not present

## 2013-12-21 NOTE — Progress Notes (Signed)
[facility] End of Treatment Note  Name:Sydney Ford  Date: 12/21/2013 VQQ:[id number] DOB:March 17, 1956   Status:outpatient    CC: Dwan Bolt, MD  Dr. Gunnar Bulla Magrinat, Dr. Rolm Bookbinder  REFERRING PHYSICIAN: Dr. Gunnar Bulla Magrinat    DIAGNOSIS:  Clinical stage IIA (T2 N0 M0), pathologic stage (ypT0, ypN0, M0) invasive ductal carcinoma of the right breast   INDICATION FOR TREATMENT: Curative   TREATMENT DATES: 11/16/2013 through 12/21/2013                          SITE/DOSE:  Right breast 5000 cGy in 25 sessions                          BEAMS/ENERGY:  Tangential fields with mixed 6 MV/15 MV photons                 NARRATIVE:   She tolerated her treatment reasonably well although she did have moderate radiation dermatitis with desquamation along her axilla and inframammary region by completion of therapy. She used Radioplex gel during her course of therapy.                         PLAN: Routine followup in one month. Patient instructed to call if questions or worsening complaints in interim.

## 2013-12-30 ENCOUNTER — Other Ambulatory Visit: Payer: Self-pay

## 2014-01-12 ENCOUNTER — Telehealth: Payer: Self-pay | Admitting: Oncology

## 2014-01-12 ENCOUNTER — Other Ambulatory Visit: Payer: Self-pay | Admitting: Emergency Medicine

## 2014-01-12 ENCOUNTER — Encounter: Payer: Self-pay | Admitting: *Deleted

## 2014-01-12 NOTE — Telephone Encounter (Signed)
lvm for pt regarding to NOV appt.. °

## 2014-01-17 ENCOUNTER — Ambulatory Visit
Admission: RE | Admit: 2014-01-17 | Discharge: 2014-01-17 | Disposition: A | Discharge status: Home / Self Care | Payer: BC Managed Care – PPO | Source: Ambulatory Visit | Attending: Radiation Oncology | Admitting: Radiation Oncology

## 2014-01-17 ENCOUNTER — Other Ambulatory Visit: Payer: Self-pay | Admitting: *Deleted

## 2014-01-17 ENCOUNTER — Encounter: Payer: Self-pay | Admitting: Radiation Oncology

## 2014-01-17 VITALS — BP 131/79 | HR 91 | Temp 98.1°F | Resp 20 | Wt 227.0 lb

## 2014-01-17 DIAGNOSIS — C50411 Malignant neoplasm of upper-outer quadrant of right female breast: Secondary | ICD-10-CM

## 2014-01-17 NOTE — Progress Notes (Signed)
Patient denies pain other than neuropathy of her feet. She takes Tramadol prn. She states her skin of right breast is completely healed; she does have some lingering fatigue. She has FU with Dr Jana Hakim on 01/18/14.

## 2014-01-17 NOTE — Progress Notes (Signed)
CC: Dr. Gunnar Bulla Magrinat  Follow-up note:   Sydney Ford visits today approximately 1 month following completion of radiation therapy following neoadjuvant chemotherapy and conservative surgery in the management of her clinical stage T2 N0 triple negative invasive ductal carcinoma the right breast. She is generally doing well and is without new complaints today. She does report mild to moderate fatigue which is slowly improving. She'll see Dr. Jana Hakim tomorrow. Her peripheral neuropathy is also improving. She takes tramadol when necessary. 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  Physical examination:. Alert and oriented.  Filed Vitals:   01/17/14 1038  BP: 131/79  Pulse: 91  Temp: 98.1 F (36.7 C)  Resp: 20   Head and neck examination: She has regrowth of hair. Nodes: There is no palpable cervical, supraclavicular, or axillary lymphadenopathy. Chest: Lungs clear. Breasts: There is residual erythema along the right breast with no areas of desquamation. No masses are appreciated. Left breast without masses or lesions. Extremities: Without edema.  Impression: Satisfactory progress.  Plan: She'll see Dr. Jana Hakim for a follow-up visit tomorrow. She can wait until next year to have a baseline right breast mammogram. I've not scheduled the patient for a formal follow-up visit in knowing that she'll be followed by Dr. Jana Hakim and Dr. Donne Hazel.

## 2014-01-18 ENCOUNTER — Ambulatory Visit (HOSPITAL_BASED_OUTPATIENT_CLINIC_OR_DEPARTMENT_OTHER): Payer: BC Managed Care – PPO | Admitting: Oncology

## 2014-01-18 ENCOUNTER — Other Ambulatory Visit (HOSPITAL_BASED_OUTPATIENT_CLINIC_OR_DEPARTMENT_OTHER): Payer: BC Managed Care – PPO

## 2014-01-18 VITALS — BP 139/76 | HR 86 | Temp 98.1°F | Resp 20 | Ht 62.0 in | Wt 227.3 lb

## 2014-01-18 DIAGNOSIS — C50411 Malignant neoplasm of upper-outer quadrant of right female breast: Secondary | ICD-10-CM

## 2014-01-18 DIAGNOSIS — G620 Drug-induced polyneuropathy: Secondary | ICD-10-CM

## 2014-01-18 LAB — CBC WITH DIFFERENTIAL/PLATELET
BASO%: 0.9 % (ref 0.0–2.0)
Basophils Absolute: 0 10*3/uL (ref 0.0–0.1)
EOS%: 1.7 % (ref 0.0–7.0)
Eosinophils Absolute: 0.1 10*3/uL (ref 0.0–0.5)
HCT: 40.6 % (ref 34.8–46.6)
HGB: 13.7 g/dL (ref 11.6–15.9)
LYMPH%: 14.3 % (ref 14.0–49.7)
MCH: 30.9 pg (ref 25.1–34.0)
MCHC: 33.7 g/dL (ref 31.5–36.0)
MCV: 91.6 fL (ref 79.5–101.0)
MONO#: 0.5 10*3/uL (ref 0.1–0.9)
MONO%: 10 % (ref 0.0–14.0)
NEUT#: 3.6 10*3/uL (ref 1.5–6.5)
NEUT%: 73.1 % (ref 38.4–76.8)
Platelets: 230 10*3/uL (ref 145–400)
RBC: 4.43 10*6/uL (ref 3.70–5.45)
RDW: 14.4 % (ref 11.2–14.5)
WBC: 4.9 10*3/uL (ref 3.9–10.3)
lymph#: 0.7 10*3/uL — ABNORMAL LOW (ref 0.9–3.3)

## 2014-01-18 LAB — COMPREHENSIVE METABOLIC PANEL (CC13)
ALK PHOS: 86 U/L (ref 40–150)
ALT: 25 U/L (ref 0–55)
AST: 18 U/L (ref 5–34)
Albumin: 3.8 g/dL (ref 3.5–5.0)
Anion Gap: 12 mEq/L — ABNORMAL HIGH (ref 3–11)
BILIRUBIN TOTAL: 0.98 mg/dL (ref 0.20–1.20)
BUN: 11.2 mg/dL (ref 7.0–26.0)
CO2: 24 mEq/L (ref 22–29)
Calcium: 10.1 mg/dL (ref 8.4–10.4)
Chloride: 105 mEq/L (ref 98–109)
Creatinine: 1 mg/dL (ref 0.6–1.1)
Glucose: 102 mg/dl (ref 70–140)
POTASSIUM: 4.5 meq/L (ref 3.5–5.1)
Sodium: 141 mEq/L (ref 136–145)
TOTAL PROTEIN: 6.8 g/dL (ref 6.4–8.3)

## 2014-01-18 MED ORDER — GABAPENTIN 300 MG PO CAPS: 300.0000 mg | ORAL_CAPSULE | Freq: Every day | ORAL | Status: DC

## 2014-01-18 NOTE — Progress Notes (Signed)
Sydney Ford  [phone] [phone]     ID: Sydney Ford OB: 11/24/1955  MR#: [id number]  XLK#:[id number]  PCP: Dwan Bolt, MD GYN:   SU: Rolm Bookbinder, MD OTHER MD: Ulyess Blossom, MD;  Franchot Gallo, MD  CHIEF COMPLAINT:  Right breast cancer/neoadjuvant chemotherapy   BREAST CANCER HISTORY: As per previously documented note:  Sydney Ford (pronounced "line 'em") herself noted a change in her right breast sometime October 2014 but "my breasts are like bean bags" and she initially ignored it. After while she began to feel a pole when she lay on her right side at night so she scheduled herself for screening mammography at the [facility] 02/08/2013 (prior mammogram had been April 2011), and this did show a possible mass in the right breast. Unilateral right diagnostic mammography and ultrasonography 02/28/2013 confirmed an irregular mass in the right breast upper outer quadrant measuring 2.4 cm. This was palpable at the 11:00 position. Ultrasound showed an irregular hypoechoic mass measuring 1.5 cm with no abnormal adenopathy in the right axilla.  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  The patient met with Dr. Donne Hazel 03/14/2013 and he recommended primary systemic chemotherapy to decrease the size of the tumor and increase the chance of lumpectomy.   Subsequent history is as detailed below.  INTERVAL HISTORY: Sydney Ford returns today for follow-up of her breast cancer. Since last visit here she completed her adjuvant radiation. She is working full-time, Education administrator houses, as before. She tells me during her chemotherapy problems it was her schizophrenic son at home who took the best care of her.   REVIEW OF SYSTEMS: Sydney Ford is having some  numbness in her finger pads and toe pads. However at night this becomes more of a problem and she has tingling as well as numbness in her toes. She is using tramadol for this with very little success. She is not constipated from that medication. Her hair is growing back straight and "fine". Sometimes her legs feel like concrete when she is walking up stairs. On one occasion she got short of breath and very sweaty. She has since dropped her lisinopril dose to 10 mg daily and those symptoms have not recurred. She does describe herself as mildly to moderately fatigued. She has aches and pains here and there as she puts it, but these are not more pronounced or intense than before. She tells me instead of developing a garden what we really need is to provide more financial aid to patient and probably some help at home to some patients. She appreciates the valet service.aside from these issues a detailed review of systems today was noncontributory  PAST MEDICAL HISTORY: Past Medical History  Diagnosis Date  . Hypertension   . Hyperlipidemia   . Thyroid disease   . Osteoporosis   . Hypothyroidism     hashimoto's  . Depression   . Pneumonia     2014  . Cold (disease) 03/21/12    couple of weeks ago - pt states much better now  . History of kidney stones     "passed 9 stones"  . GERD (gastroesophageal reflux disease)     no meds except occas tums  . Headache(784.0)     occas migraine  . Cancer     new dx of right breast cancer -  planning chemo first  . Arthritis     oa; lower back pain - hx of injury yrs ago-occas flare ups  . Anemia     2005 - related to heavy menses with the thyroid probl  . Chronic kidney disease     kidney stone  . Breast cancer 02/28/13    right, 11 o'clock  . Hx of radiation therapy 11/16/13- 12/21/13    right breast 5000 cGy in 25 sessions    PAST SURGICAL HISTORY:  (Updated 07/07/2013) Past Surgical History  Procedure Laterality Date  . Cesarean section  1987  .  Tonsillectomy    . Uterine ablation  2008    no menstrual periods since procedure  . Portacath placement N/A 03/23/2013    Procedure: INSERTION PORT-A-CATH;  Surgeon: Rolm Bookbinder, MD;  Location: [facility];  Service: General;  Laterality: N/A;  . Cystoscopy with retrograde pyelogram, ureteroscopy and stent placement Right 06/30/2013    Procedure: CYSTOSCOPY WITH RETROGRADE PYELOGRAM, URETEROSCOPY, STONE EXTRACTION AND  STENT PLACEMENT;  Surgeon: Franchot Gallo, MD;  Location: [facility];  Service: Urology;  Laterality: Right;  . Holmium laser application Right 5/95/6387    Procedure: HOLMIUM LASER APPLICATION;  Surgeon: Franchot Gallo, MD;  Location: [facility];  Service: Urology;  Laterality: Right;  . Breast lumpectomy with radioactive seed localization Right 09/01/2013    Procedure: BREAST LUMPECTOMY WITH RADIOACTIVE SEED LOCALIZATION;  Surgeon: Rolm Bookbinder, MD;  Location: [facility];  Service: General;  Laterality: Right;  . Axillary sentinel node biopsy Right 09/01/2013    Procedure: RIGHT AXILLARY SENTINEL NODE BIOPSY;  Surgeon: Rolm Bookbinder, MD;  Location: [facility];  Service: General;  Laterality: Right;  PECTORAL BLOCK WITH ANESTHESIA   . Port-a-cath removal Left 09/01/2013    Procedure: REMOVAL PORT-A-CATH;  Surgeon: Rolm Bookbinder, MD;  Location: [facility];  Service: General;  Laterality: Left;    FAMILY HISTORY Family History  Problem Relation Age of Onset  . Heart failure Mother   . Heart attack Father   . Autoimmune disease Maternal Aunt     lupus, scelraderma, reynolds disease, and others  . Stroke Paternal Uncle   . Heart failure Maternal Grandmother   . Heart attack Maternal Grandfather   . Throat cancer Paternal Grandmother     dx late 79s; non smoker or drinker  . Heart attack Paternal Grandfather   . Schizophrenia Son 19  . Heart attack Paternal Uncle    the patient's father died at the age of 57 from a  myocardial infarction. The patient's mother died at the age of 8 with congestive heart failure. She also had a history of lupus. The patient had no brothers or sisters. There is no history of breast or ovarian cancer in the family to her knowledge.  GYNECOLOGIC HISTORY:   (reviewed on 08/25/2013)  Menarche age 64, first live birth age 55. She is GX P2. She underwent endometrial ablation in 2009 and has not had a period since that time. She never took hormone replacement. She did use birth control pills remotely with no complications  SOCIAL HISTORY: (Reviewd 08/25/2013)  Sydney Ford works at [employer] for a variety of long-term clients. She does not work for a company. Her husband Sydney Rinks "Clair Gulling" Satira Ford is semiretired. He works for a company that [employer] data on [place]. Son Sydney Ford is in [place] and works in Architect. Son Sydney Ford lives at home and is disabled with a diagnosis of paranoid schizophrenia. The patient  has no grandchildren. She is not a church attender    ADVANCED DIRECTIVES: Not in place   HEALTH MAINTENANCE: ( reviewed 08/25/2013 ) History  Substance Use Topics  . Smoking status: Former Smoker    Quit date: 03/17/1985  . Smokeless tobacco: Never Used  . Alcohol Use: No     Colonoscopy: Never  PAP: Remote  Bone density: November 2014, showed osteoporosis   according to the patient's report  Lipid panel:  Not on file, Dr. Wilson Singer   Allergies  Allergen Reactions  . Codeine Itching and Nausea Only    REACTION: Nausea and itching  . Compazine [Prochlorperazine Edisylate] Other (See Comments)    Head aches   . Other     Close contact with Some recycled plastics cause whelps  . Tape Rash    Needs OPSITE dressing with port access    Current Outpatient Prescriptions  Medication Sig Dispense Refill  . atorvastatin (LIPITOR) 20 MG tablet Take 20 mg by mouth every evening.     . Cholecalciferol (VITAMIN D3) 3000 UNITS TABS Take by mouth.    Marland Kitchen lisinopril  (PRINIVIL,ZESTRIL) 20 MG tablet Take 20 mg by mouth daily.    . Magnesium 250 MG TABS Take by mouth.    . Melatonin 3 MG TABS Take by mouth.    Marland Kitchen omeprazole (PRILOSEC) 40 MG capsule   3  . Potassium Gluconate 595 MG CAPS Take by mouth.    . thyroid (ARMOUR) 90 MG tablet Take 90 mg by mouth every morning.    . traMADol (ULTRAM) 50 MG tablet Take 1-2 tablets (50-100 mg total) by mouth 3 (three) times daily as needed for moderate pain or severe pain. 100 tablet 0   No current facility-administered medications for this visit.    PHYSICAL EXAM: Middle-aged Caucasian female who appears stated age   58 Vitals:   01/18/14 0945  BP: 139/76  Pulse: 86  Temp: 98.1 F (36.7 C)  Resp: 20     Body mass index is 41.56 kg/(m^2).    ECOG FS: 1 Filed Weights   01/18/14 0945  Weight: 227 lb 4.8 oz (103.103 kg)   Sclerae unicteric, pupils equal and reactive Oropharynx clear, teeth in good repair No cervical or supraclavicular adenopathy Lungs no rales or rhonchi Heart regular rate and rhythm Abd soft, nontender, positive bowel sounds MSK no focal spinal tenderness, no upper extremity lymphedema Neuro: nonfocal, well oriented, appropriate affect Breasts: the right breast is status post lumpectomy and radiation. It is slightly smaller than the left. There is a residual mild blush. There are no skin or nipple findings of concern and there is no evidence of disease recurrence. The right axilla is benign or the left breast is unremarkable.    STUDIES: No results found.  LAB RESULTS:  Lab Results  Component Value Date   WBC 4.9 01/18/2014   NEUTROABS 3.6 01/18/2014   HGB 13.7 01/18/2014   HCT 40.6 01/18/2014   MCV 91.6 01/18/2014   PLT 230 01/18/2014      Chemistry      Component Value Date/Time   NA 141 01/18/2014 0932   NA 145 08/31/2013 1340   K 4.5 01/18/2014 0932   K 4.4 08/31/2013 1340   CL 105 08/31/2013 1340   CO2 24 01/18/2014 0932   CO2 26 08/31/2013 1340   BUN 11.2  01/18/2014 0932   BUN 16 08/31/2013 1340   CREATININE 1.0 01/18/2014 0932   CREATININE 1.20* 08/31/2013 1340  Component Value Date/Time   CALCIUM 10.1 01/18/2014 0932   CALCIUM 9.5 08/31/2013 1340   ALKPHOS 86 01/18/2014 0932   ALKPHOS 65 05/27/2013 0530   AST 18 01/18/2014 0932   AST 7 05/27/2013 0530   ALT 25 01/18/2014 0932   ALT 11 05/27/2013 0530   BILITOT 0.98 01/18/2014 0932   BILITOT 0.5 05/27/2013 0530       ASSESSMENT: 58 y.o. 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  (2) treated in the neoadjuvant setting,   (a) completed 4 dose dense cycles of doxorubicin/ cyclophosphamide 05/19/2013. Cycle 4 was delayed one week because of bad weather  (b) received 7 of 12 planned weekly doses of carboplatin/ paclitaxel, with paclitaxel given alone with the 8th cycle, after which chemotherapy was stopped because of cytopenias and neuropathy; final dose 08/04/2013.  (3) status post right lumpectomy and sentinel lymph node sampling 09/01/2013 showing a complete pathologic response.  (4) adjuvantradiation therapy completed 12/21/2013.  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 Diona Fanti  (7) thyroiditis: followed by Dr. Wilson Singer  PLAN: Pamhas completed the active phase of her treatment. We are now starting follow-up. She is making a very nice transition and has already gone back to work full time. I think she would benefit from the "finding your new normal" group and I gave her that information.she knows because she achieved a complete pathologic response she has a good long-term prognosis.  She is concerned about weight. We had a long discussion regarding diet. We also talked about the difference between wait  and fitness. I gave her information on the live strong program and hopefully she will enroll.  Her sense of taste has not yet returned. She is going to try adding a little bit of low mine or vinegar to her food and see if that helps  She is using tramadol for the peripheral neuropathy. This is not particularly effective in her case. We are going to switch to gabapentin 300 mg at bedtime. She will let me know if that causes any problems.  Otherwise she is planning to see Dr. Wilson Singer in December. If she sees Dr. Donne Hazel in March she can see [place] in June.  Sydney Ford has a good understanding of the overall plan. She agrees with it. She knows the goal of treatment in her case is cure. She will call with any problems that may develop before her next visit here. Chauncey Cruel, MD     01/18/2014

## 2014-01-18 NOTE — Addendum Note (Signed)
Addended by: Laureen Abrahams on: 01/18/2014 06:34 PM   Modules accepted: Orders, Medications

## 2014-01-19 ENCOUNTER — Telehealth: Payer: Self-pay | Admitting: Oncology

## 2014-01-19 NOTE — Telephone Encounter (Signed)
LM to confirm appt d/t for June. Mailed cal.

## 2014-02-21 ENCOUNTER — Other Ambulatory Visit: Payer: Self-pay | Admitting: Adult Health

## 2014-02-21 DIAGNOSIS — C50411 Malignant neoplasm of upper-outer quadrant of right female breast: Secondary | ICD-10-CM

## 2014-02-25 NOTE — Telephone Encounter (Signed)
none

## 2014-03-30 ENCOUNTER — Telehealth: Payer: Self-pay | Admitting: *Deleted

## 2014-03-30 NOTE — Telephone Encounter (Signed)
This RN spoke with pt per her call stating noting increase in neuropathy in feet -  Of note she states " I usually take tramadol nightly- but ran out. That is when I really noticed the increased discomfort "  She is scheduled to see Dr Wilson Singer next week to obtain refills on the tramadol.  " but I think the tramadol was masking the extent of my neuropathy "  Sydney Ford states she is having pain in toes and bottom of feet- which is worse at night and interrupts her sleep. " I have to constantly move my legs- but I do not have any pain in my legs ". " it is like in my head- if I don't move my legs I am going to go crazy ".  "I am also noticing that I have some neuropathy during the day which I didn't notice before but that discomfort I can ignore"  Sydney Ford is inquiring if she can increase gabapentin to see if benefit for above.  Sydney Ford will take 2 of her gabapentin tonight and see if there is benefit and call this RN in am with results.

## 2014-05-01 ENCOUNTER — Other Ambulatory Visit: Payer: Self-pay | Admitting: Emergency Medicine

## 2014-05-01 MED ORDER — GABAPENTIN 300 MG PO CAPS: 600.0000 mg | ORAL_CAPSULE | Freq: Every day | ORAL | Status: DC

## 2014-08-16 ENCOUNTER — Other Ambulatory Visit (HOSPITAL_BASED_OUTPATIENT_CLINIC_OR_DEPARTMENT_OTHER): Payer: 59

## 2014-08-16 DIAGNOSIS — C50411 Malignant neoplasm of upper-outer quadrant of right female breast: Secondary | ICD-10-CM | POA: Diagnosis not present

## 2014-08-16 LAB — CBC WITH DIFFERENTIAL/PLATELET
BASO%: 0.8 % (ref 0.0–2.0)
Basophils Absolute: 0 10*3/uL (ref 0.0–0.1)
EOS%: 6.2 % (ref 0.0–7.0)
Eosinophils Absolute: 0.2 10*3/uL (ref 0.0–0.5)
HCT: 41.1 % (ref 34.8–46.6)
HGB: 14 g/dL (ref 11.6–15.9)
LYMPH%: 16.5 % (ref 14.0–49.7)
MCH: 30.8 pg (ref 25.1–34.0)
MCHC: 34.1 g/dL (ref 31.5–36.0)
MCV: 90.3 fL (ref 79.5–101.0)
MONO#: 0.3 10*3/uL (ref 0.1–0.9)
MONO%: 8.3 % (ref 0.0–14.0)
NEUT%: 68.2 % (ref 38.4–76.8)
NEUTROS ABS: 2.6 10*3/uL (ref 1.5–6.5)
Platelets: 160 10*3/uL (ref 145–400)
RBC: 4.55 10*6/uL (ref 3.70–5.45)
RDW: 12.8 % (ref 11.2–14.5)
WBC: 3.9 10*3/uL (ref 3.9–10.3)
lymph#: 0.6 10*3/uL — ABNORMAL LOW (ref 0.9–3.3)

## 2014-08-16 LAB — COMPREHENSIVE METABOLIC PANEL (CC13)
ALT: 18 U/L (ref 0–55)
ANION GAP: 10 meq/L (ref 3–11)
AST: 18 U/L (ref 5–34)
Albumin: 3.7 g/dL (ref 3.5–5.0)
Alkaline Phosphatase: 95 U/L (ref 40–150)
BUN: 15 mg/dL (ref 7.0–26.0)
CHLORIDE: 107 meq/L (ref 98–109)
CO2: 26 meq/L (ref 22–29)
CREATININE: 0.9 mg/dL (ref 0.6–1.1)
Calcium: 8.6 mg/dL (ref 8.4–10.4)
EGFR: 71 mL/min/{1.73_m2} — AB (ref >90)
Glucose: 98 mg/dl (ref 70–140)
POTASSIUM: 4.2 meq/L (ref 3.5–5.1)
SODIUM: 142 meq/L (ref 136–145)
TOTAL PROTEIN: 6.5 g/dL (ref 6.4–8.3)
Total Bilirubin: 0.7 mg/dL (ref 0.20–1.20)

## 2014-08-23 ENCOUNTER — Telehealth: Payer: Self-pay | Admitting: Oncology

## 2014-08-23 ENCOUNTER — Ambulatory Visit (HOSPITAL_BASED_OUTPATIENT_CLINIC_OR_DEPARTMENT_OTHER): Payer: 59 | Admitting: Oncology

## 2014-08-23 VITALS — BP 130/66 | HR 69 | Temp 98.0°F | Resp 18 | Ht 62.0 in | Wt 215.9 lb

## 2014-08-23 DIAGNOSIS — G620 Drug-induced polyneuropathy: Secondary | ICD-10-CM | POA: Diagnosis not present

## 2014-08-23 DIAGNOSIS — E069 Thyroiditis, unspecified: Secondary | ICD-10-CM | POA: Diagnosis not present

## 2014-08-23 DIAGNOSIS — C50411 Malignant neoplasm of upper-outer quadrant of right female breast: Secondary | ICD-10-CM

## 2014-08-23 NOTE — Telephone Encounter (Signed)
Appointments made and avs printed for patient,patient to see dr tafeen 7/6 8:30 and is aware to call her pcp to fax a ref to them due to compass on her card

## 2014-08-23 NOTE — Progress Notes (Signed)
[place]  [phone] [phone]     ID: Sydney Ford OB: [birth year]  MR#: [id number]  FGH#:[id number]  PCP: Sydney Bolt, MD GYN:   SU: Sydney Bookbinder, MD OTHER MD: Sydney Blossom, MD;  Sydney Gallo, MD  CHIEF COMPLAINT:  Triple negative Right breast cancer  CURRENT TREATMENT: Observation   BREAST CANCER HISTORY: As per previously documented note:  Sydney Ford (pronounced "line 'em") herself noted a change in her right breast sometime October 2014 but "my breasts are like bean bags" and she initially ignored it. After while she began to feel a pole when she lay on her right side at night so she scheduled herself for screening mammography at the [facility] 02/08/2013 (prior mammogram had been April 2011), and this did show a possible mass in the right breast. Unilateral right diagnostic mammography and ultrasonography 02/28/2013 confirmed an irregular mass in the right breast upper outer quadrant measuring 2.4 cm. This was palpable at the 11:00 position. Ultrasound showed an irregular hypoechoic mass measuring 1.5 cm with no abnormal adenopathy in the right axilla.  Biopsy of the mass was performed the same day, and showed ([id number]) an invasive ductal carcinoma, grade not stated, E-cadherin strongly positive, estrogen and progesterone receptor negative with no HER-2 amplification, and an MIB-1 of 99%.  The patient met with Dr. Donne Ford 03/14/2013 and he recommended primary systemic chemotherapy to decrease the size of the tumor and increase the chance of lumpectomy.   Subsequent history is as detailed below.  INTERVAL HISTORY: Sydney Ford returns today for follow-up of her breast cancer. 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 She does describe herself is moderately  fatigued. There are ongoing/chronic home stresses that we discussed today  REVIEW OF SYSTEMS: Sydney Ford worries that sometimes her systolic blood pressure goes up a bit. This occurs with activity and would be normal in that case. When she is at rest she tells me it's between 017 and 510 systolic and in the low 25E diastolic. She is currently off lisinopril. A detailed review of systems was otherwise stable  PAST MEDICAL HISTORY: Past Medical History  Diagnosis Date  . Hypertension   . Hyperlipidemia   . Thyroid disease   . Osteoporosis   . Hypothyroidism     hashimoto's  . Depression   . Pneumonia     2014  . Cold (disease) 03/21/12    couple of weeks ago - pt states much better now  . History of kidney stones     "passed 9 stones"  . GERD (gastroesophageal reflux disease)     no meds except occas tums  . Headache(784.0)     occas migraine  . Cancer     new dx of right breast cancer - planning chemo first  . Arthritis     oa; lower back pain - hx of injury yrs ago-occas flare ups  . Anemia     2005 - related to heavy menses with the thyroid probl  . Chronic kidney disease     kidney stone  . Breast cancer 02/28/13    right, 11 o'clock  . Hx of radiation therapy 11/16/13- 12/21/13    right breast 5000 cGy in 25 sessions    PAST SURGICAL HISTORY:  (Updated 07/07/2013) Past Surgical History  Procedure Laterality Date  . Cesarean section  1987  . Tonsillectomy    . Uterine ablation  2008    no menstrual periods since procedure  . Portacath placement N/A 03/23/2013    Procedure: INSERTION PORT-A-CATH;  Surgeon: Sydney Bookbinder, MD;  Location: [facility];  Service: General;  Laterality: N/A;  . Cystoscopy with retrograde pyelogram, ureteroscopy and stent placement Right 06/30/2013    Procedure: CYSTOSCOPY WITH RETROGRADE PYELOGRAM, URETEROSCOPY, STONE EXTRACTION AND  STENT PLACEMENT;  Surgeon: Sydney Gallo, MD;  Location: [facility];  Service: Urology;  Laterality: Right;  . Holmium  laser application Right 4/38/3818    Procedure: HOLMIUM LASER APPLICATION;  Surgeon: Sydney Gallo, MD;  Location: [facility];  Service: Urology;  Laterality: Right;  . Breast lumpectomy with radioactive seed localization Right 09/01/2013    Procedure: BREAST LUMPECTOMY WITH RADIOACTIVE SEED LOCALIZATION;  Surgeon: Sydney Bookbinder, MD;  Location: [facility];  Service: General;  Laterality: Right;  . Axillary sentinel node biopsy Right 09/01/2013    Procedure: RIGHT AXILLARY SENTINEL NODE BIOPSY;  Surgeon: Sydney Bookbinder, MD;  Location: [facility];  Service: General;  Laterality: Right;  PECTORAL BLOCK WITH ANESTHESIA   . Port-a-cath removal Left 09/01/2013    Procedure: REMOVAL PORT-A-CATH;  Surgeon: Sydney Bookbinder, MD;  Location: [facility];  Service: General;  Laterality: Left;    FAMILY HISTORY Family History  Problem Relation Age of Onset  . Heart failure Mother   . Heart attack Father   . Autoimmune disease Maternal Aunt     lupus, scelraderma, reynolds disease, and others  . Stroke Paternal Uncle   . Heart failure Maternal Grandmother   . Heart attack Maternal Grandfather   . Throat cancer Paternal Grandmother     dx late 75s; non smoker or drinker  . Heart attack Paternal Grandfather   . Schizophrenia Son 22  . Heart attack Paternal Uncle    the patient's father died at the age of 63 from a myocardial infarction. The patient's mother died at the age of 25 with congestive heart failure. She also had a history of lupus. The patient had no brothers or sisters. There is no history of breast or ovarian cancer in the family to her knowledge.  GYNECOLOGIC HISTORY:   (reviewed on 08/25/2013)  Menarche age 34, first live birth age 71. She is GX P2. She underwent endometrial ablation in 2009 and has not had a period since that time. She never took hormone replacement. She did use birth control pills remotely with no complications  SOCIAL  HISTORY: (Reviewd 08/25/2013)  Sydney Ford works at [employer] for a variety of long-term clients. She does not work for a company. Her husband Sydney Ford "Sydney Gulling" Satira Ford is semiretired. He works for a company that [employer] data on [place]. There is a complex history that I am not detailing here. Son Sydney Ford is in [place] and works in Architect. Son Sydney Ford lives at home and is disabled with a diagnosis of paranoid schizophrenia. The patient has no grandchildren. She is not a church attender    ADVANCED DIRECTIVES: Not in place   HEALTH MAINTENANCE: ( reviewed 08/25/2013 ) History  Substance Use Topics  . Smoking status: Former Smoker    Quit date: 03/17/1985  . Smokeless tobacco: Never Used  . Alcohol Use: No     Colonoscopy: Never  PAP: Remote  Bone density: November 2014, showed osteoporosis   according to the patient's report  Lipid panel:  Not on file, Dr. Wilson Singer   Allergies  Allergen Reactions  . Codeine Itching and Nausea Only    REACTION: Nausea and itching  . Compazine [Prochlorperazine Edisylate] Other (See Comments)    Head aches   . Other     Close contact with Some recycled plastics cause whelps  . Tape Rash    Needs OPSITE dressing with port access    Current Outpatient Prescriptions  Medication Sig Dispense Refill  . atorvastatin (LIPITOR) 20 MG tablet Take 20 mg by mouth every evening.     . gabapentin (NEURONTIN) 300 MG capsule Take 2 capsules (600 mg total) by mouth at bedtime. 60 capsule 11  . lisinopril (PRINIVIL,ZESTRIL) 20 MG tablet Take 10 mg by mouth daily.     Marland Kitchen omeprazole (PRILOSEC) 40 MG capsule   3  . Potassium Gluconate 595 MG CAPS Take by mouth.    . thyroid (ARMOUR) 90 MG tablet Take 90 mg by mouth every morning.    . traMADol (ULTRAM) 50 MG tablet TAKE ONE TO TWO TABLETS BY MOUTH THREE TIMES DAILY AS NEEDED FOR MODERATE OR SEVERE  PAIN 100 tablet 0   No current facility-administered medications for this visit.    Objective: Middle-aged  white woman in no acute distress   Filed Vitals:   08/23/14 0854  BP: 130/66  Pulse: 69  Temp: 98 F (36.7 C)  Resp: 18     Body mass index is 39.48 kg/(m^2).    ECOG FS: 1 Filed Weights   08/23/14 0854  Weight: 215 lb 14.4 oz (97.932 kg)   Sclerae unicteric, pupils round and equal Oropharynx clear and moist-- no thrush or other lesions No cervical or supraclavicular adenopathy Lungs no rales or rhonchi Heart regular rate and rhythm Abd soft, nontender, positive bowel sounds MSK no focal spinal tenderness, no upper extremity lymphedema Neuro: nonfocal, well oriented, appropriate affect Breasts: The right breast is status post lumpectomy and radiation. There is no evidence of local recurrence. The right axilla is benign per the left breast is unremarkable Skin: There is a 2-3 mm subcutaneous nodule in the left upper quadrant of the abdomen which I would not of noticed if the patient had not brought it to my attention. It is not erythematous. It is not currently tender but she tells me that it was tender at one point. There is a similar lesion in the right upper quadrant. In addition she has a scalloped hyperpigmented mole in the skin of the right upper quadrant of the abdomen.     STUDIES: No results found.  LAB RESULTS:  Lab Results  Component Value Date   WBC 3.9 08/16/2014   NEUTROABS 2.6 08/16/2014   HGB 14.0 08/16/2014   HCT 41.1 08/16/2014   MCV 90.3 08/16/2014   PLT 160 08/16/2014      Chemistry      Component Value Date/Time   NA 142 08/16/2014 0844   NA 145 08/31/2013 1340   K 4.2 08/16/2014 0844   K 4.4 08/31/2013 1340   CL 105 08/31/2013 1340   CO2 26 08/16/2014 0844   CO2 26 08/31/2013 1340   BUN 15.0 08/16/2014 0844   BUN 16 08/31/2013 1340   CREATININE 0.9 08/16/2014 0844   CREATININE 1.20* 08/31/2013 1340      Component Value Date/Time   CALCIUM 8.6 08/16/2014 0844   CALCIUM 9.5 08/31/2013 1340   ALKPHOS 95 08/16/2014 0844   ALKPHOS 65  05/27/2013 0530   AST 18 08/16/2014 0844   AST 7 05/27/2013 0530   ALT  18 08/16/2014 0844   ALT 11 05/27/2013 0530   BILITOT 0.70 08/16/2014 0844   BILITOT 0.5 05/27/2013 0530       ASSESSMENT: 59 y.o. BRCA1 and BRCA2 negative [place] woman   (1)  status post right breast biopsy 02/28/2013 for a clinical T2 N0, stage IIA invasive ductal carcinoma, grade not stated, triple negative, with an MIB-1 of 99%.  (2) treated in the neoadjuvant setting,   (a) completed 4 dose dense cycles of doxorubicin/ cyclophosphamide 05/19/2013. Cycle 4 was delayed one week because of bad weather  (b) received 7 of 12 planned weekly doses of carboplatin/ paclitaxel, with paclitaxel given alone with the 8th cycle, after which chemotherapy was stopped because of cytopenias and neuropathy; final dose 08/04/2013.  (3) status post right lumpectomy and sentinel lymph node sampling 09/01/2013 showing a complete pathologic response.  (4) adjuvant radiation therapy completed 12/21/2013.  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 Diona Fanti  (7) thyroiditis: followed by Dr. Wilson Singer  PLAN: Sydney Ford is generally doing well from a breast cancer point of view, now 1 year out from her definitive surgery. She does have some soreness to palpation of her rib cage and she has 2 subcutaneous nodules which I think are going to be fat necrosis. We discussed getting a chest CT possibly with fine cuts through the upper abdomen, but she is very concerned about finances and at this point what were going to do is simply follow-up. I will make sure to revisit these 2 areas when she sees me again in 3 months  She does need evaluation of the scalloped hyperpigmented lesion on the skin of her left upper quadrant and I am referring her to dermatology for that.  Otherwise Sydney Ford will call with any problems that may develop before her next  visit here. Chauncey Cruel, MD     08/23/2014

## 2014-11-28 ENCOUNTER — Telehealth: Payer: Self-pay | Admitting: Oncology

## 2014-11-28 ENCOUNTER — Other Ambulatory Visit: Payer: Self-pay

## 2014-11-28 ENCOUNTER — Other Ambulatory Visit: Payer: Self-pay | Admitting: Oncology

## 2014-11-28 DIAGNOSIS — C50911 Malignant neoplasm of unspecified site of right female breast: Secondary | ICD-10-CM

## 2014-11-28 DIAGNOSIS — Z9889 Other specified postprocedural states: Secondary | ICD-10-CM

## 2014-11-28 IMAGING — CT CT ABD-PELV W/O CM
1 series · 14 of 18 positions shown, 19 images · non-contrast
Comparison: US RENAL dated 05/30/2013

CLINICAL DATA: Abdominal pain, history of kidney stones and breast
cancer.

EXAM:
CT ABDOMEN AND PELVIS WITHOUT CONTRAST
TECHNIQUE: Multidetector CT imaging of the abdomen and pelvis was performed
following the standard protocol without intravenous contrast.

[Series 3: lung · axial · 0.83mm/px · z∈[+1450,+1526]mm · 14 of 18 slices shown, 19 images]
[im 2/18  soft-tissue]
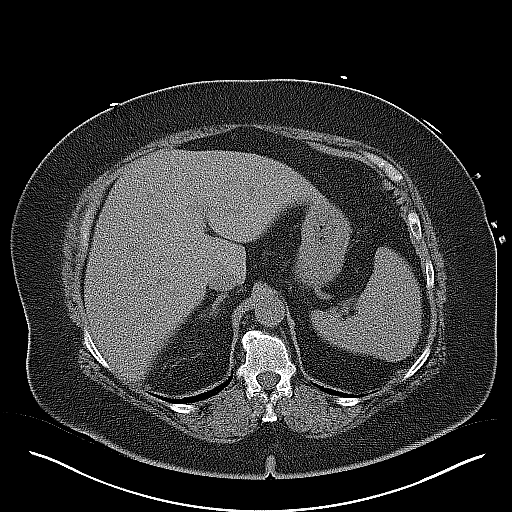
[im 2/18  bone]
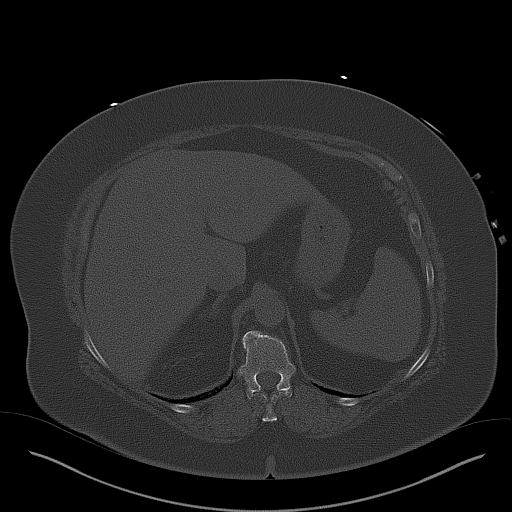
[im 3/18  soft-tissue]
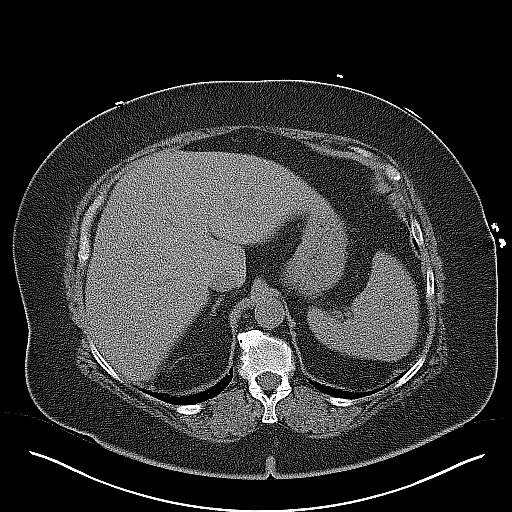
[im 5/18  soft-tissue]
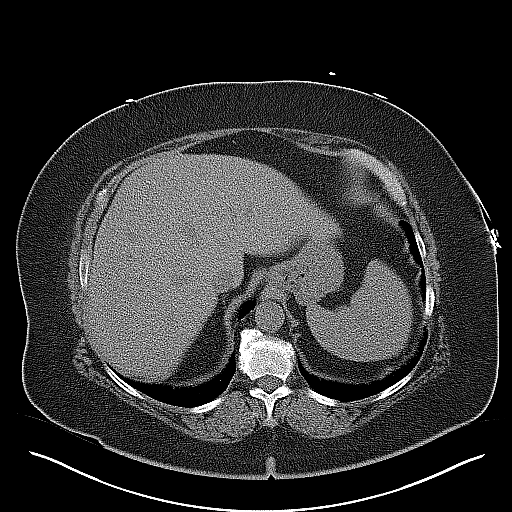
[im 6/18  soft-tissue]
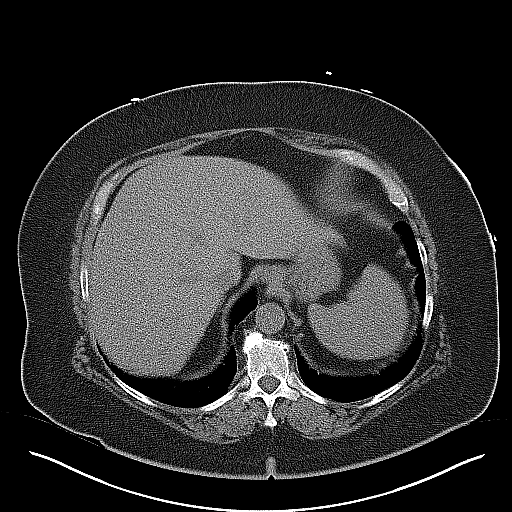
[im 7/18  soft-tissue]
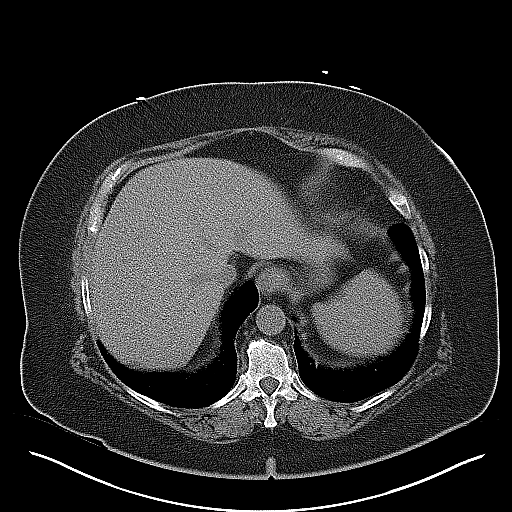
[im 8/18  soft-tissue]
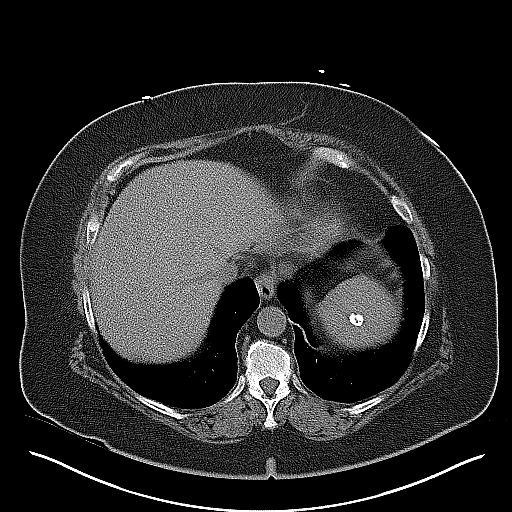
[im 10/18  soft-tissue]
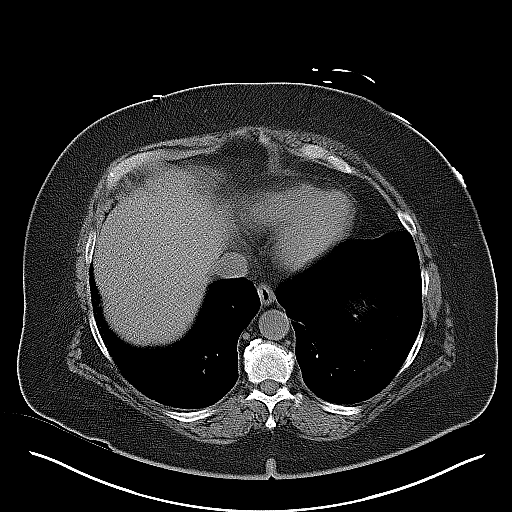
[im 11/18  soft-tissue]
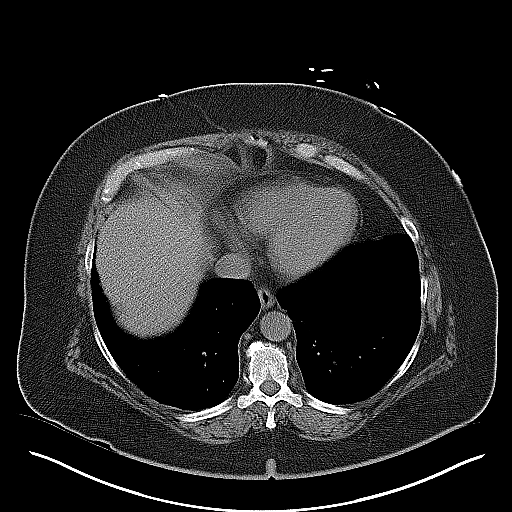
[im 12/18  soft-tissue]
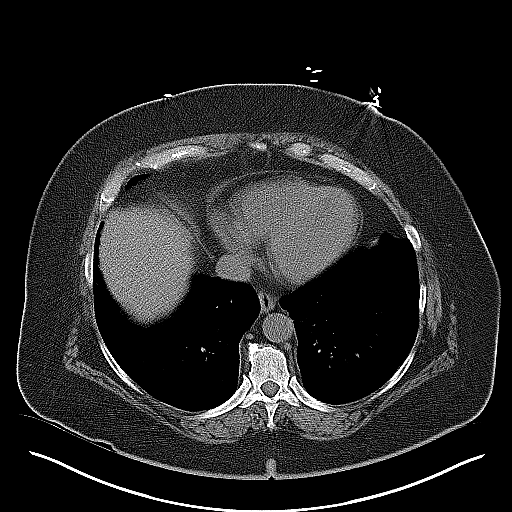
[im 12/18  bone]
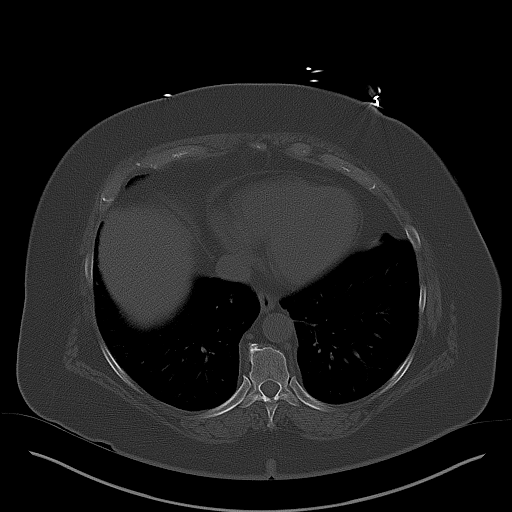
[im 13/18  soft-tissue]
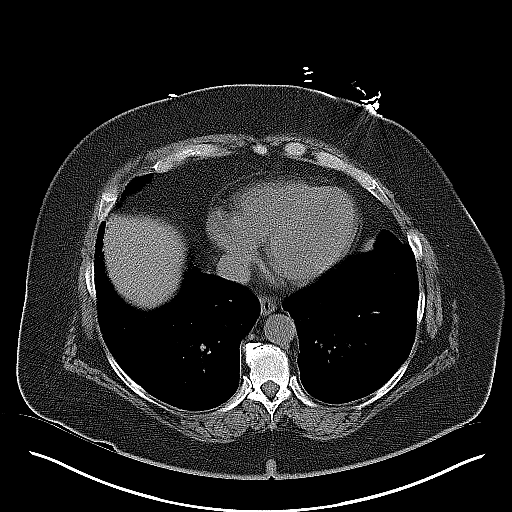
[im 14/18  soft-tissue]
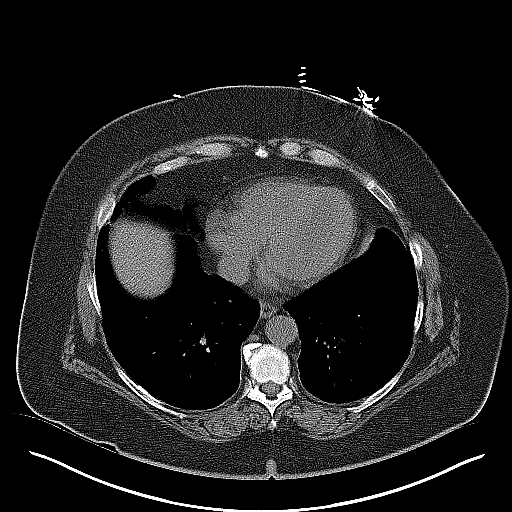
[im 14/18  lung]
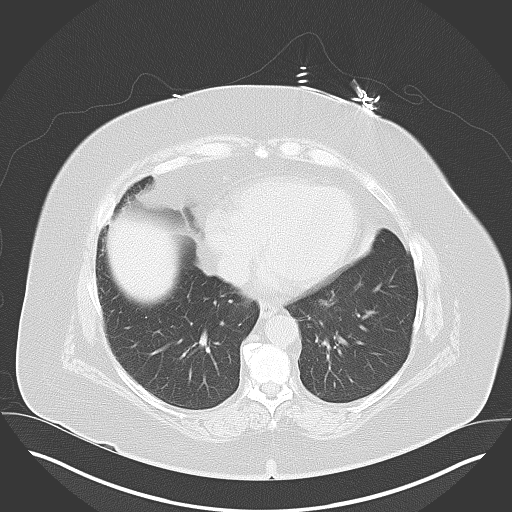
[im 15/18  lung]
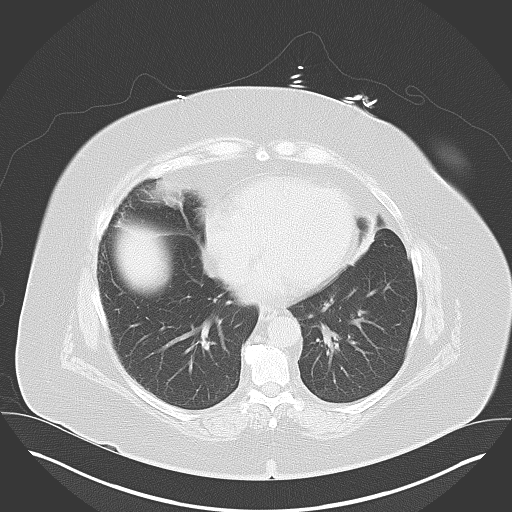
[im 16/18  soft-tissue]
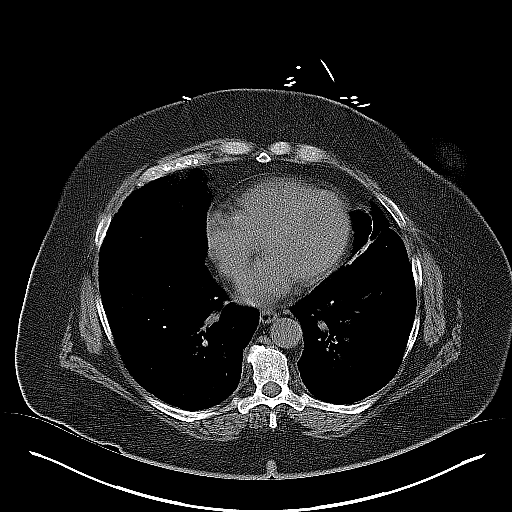
[im 16/18  lung]
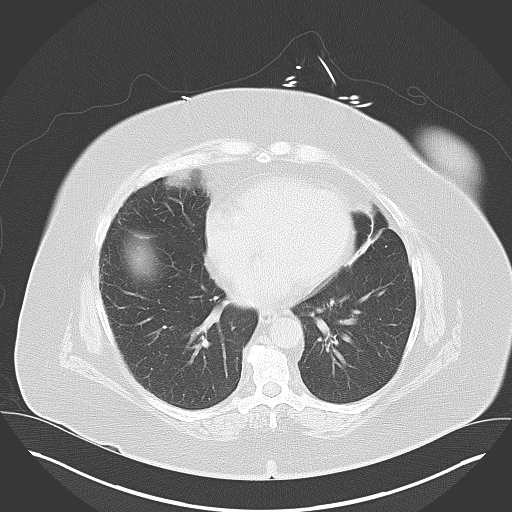
[im 17/18  soft-tissue]
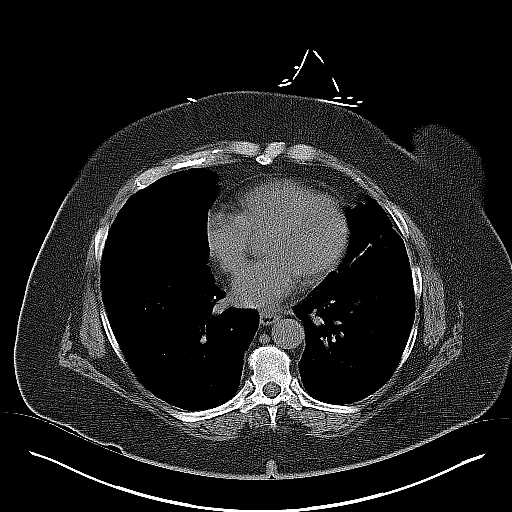
[im 17/18  lung]
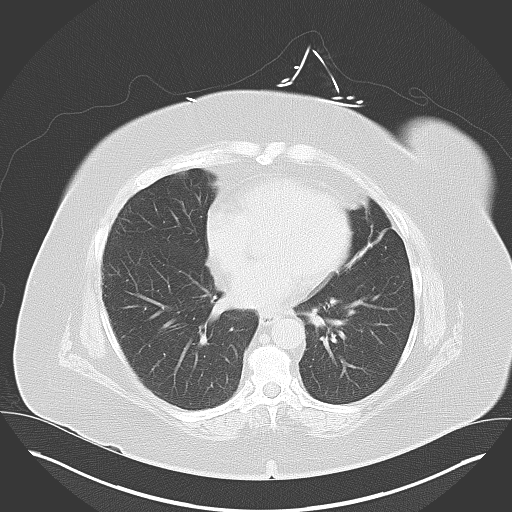

[14 of 18 positions shown; findings below may reference images not displayed]

FINDINGS: Limited view of the lung bases are clear. Included heart and
pericardium are unremarkable.

Moderate right hydroureteronephrosis to the level of the mid ureter
where a 7 x 6 mm calculus is seen. Multiple bilateral
nephrolithiasis, including 6 mm right interpolar calculus,
contiguous calculi in the right lower pole measuring 6 mm. 6 mm left
lower pole renal calculus. No left hydronephrosis.

The liver, pancreas, gallbladder and adrenal glands are
unremarkable. Coarse calcification in the spleen which is otherwise
unremarkable.

Stomach, small and large bowel are normal in course and caliber
without inflammatory changes. Normal appendix. No intraperitoneal
free fluid or free air. Aortoiliac vessels are normal course and
caliber. Internal reproductive organs are unremarkable. Urinary
bladder is decompressed harboring no intravesicular calculi.
Phleboliths in the pelvis.

Moderate degenerative changes of pubic symphysis. Grade 1 L5-S1
anterolisthesis on degenerative basis with severe L4-5 degenerative
disc disease. Small fat containing umbilical hernia.
IMPRESSION: Moderate right hydroureteronephrosis to the level of the mid ureter
where a 7 x 6 mm calculus is seen. Bilateral residual
nephrolithiasis as described above. No left hydronephrosis.

  By: Yris Govan

## 2014-11-28 NOTE — Telephone Encounter (Signed)
Returned Advertising account executive. Patient canceled appointment 09/15 appointment due to issues with insurance

## 2014-11-28 NOTE — Telephone Encounter (Signed)
Error

## 2014-11-29 ENCOUNTER — Other Ambulatory Visit: Payer: Self-pay | Admitting: *Deleted

## 2014-11-29 ENCOUNTER — Encounter: Payer: Self-pay | Admitting: Oncology

## 2014-11-29 DIAGNOSIS — C50411 Malignant neoplasm of upper-outer quadrant of right female breast: Secondary | ICD-10-CM

## 2014-11-30 ENCOUNTER — Ambulatory Visit (HOSPITAL_BASED_OUTPATIENT_CLINIC_OR_DEPARTMENT_OTHER): Payer: 59 | Admitting: Oncology

## 2014-11-30 ENCOUNTER — Ambulatory Visit: Payer: 59 | Admitting: Oncology

## 2014-11-30 ENCOUNTER — Other Ambulatory Visit (HOSPITAL_BASED_OUTPATIENT_CLINIC_OR_DEPARTMENT_OTHER): Payer: 59

## 2014-11-30 ENCOUNTER — Telehealth: Payer: Self-pay | Admitting: Oncology

## 2014-11-30 ENCOUNTER — Other Ambulatory Visit: Payer: 59

## 2014-11-30 VITALS — BP 131/69 | HR 69 | Temp 99.1°F | Resp 18 | Ht 62.0 in | Wt 212.5 lb

## 2014-11-30 DIAGNOSIS — C50411 Malignant neoplasm of upper-outer quadrant of right female breast: Secondary | ICD-10-CM | POA: Diagnosis not present

## 2014-11-30 LAB — CBC WITH DIFFERENTIAL/PLATELET
BASO%: 0.9 % (ref 0.0–2.0)
Basophils Absolute: 0 10*3/uL (ref 0.0–0.1)
EOS ABS: 0.1 10*3/uL (ref 0.0–0.5)
EOS%: 3.7 % (ref 0.0–7.0)
HCT: 42.2 % (ref 34.8–46.6)
HGB: 14.4 g/dL (ref 11.6–15.9)
LYMPH%: 20.1 % (ref 14.0–49.7)
MCH: 30.4 pg (ref 25.1–34.0)
MCHC: 34.1 g/dL (ref 31.5–36.0)
MCV: 89.2 fL (ref 79.5–101.0)
MONO#: 0.3 10*3/uL (ref 0.1–0.9)
MONO%: 9 % (ref 0.0–14.0)
NEUT%: 66.3 % (ref 38.4–76.8)
NEUTROS ABS: 2.1 10*3/uL (ref 1.5–6.5)
PLATELETS: 174 10*3/uL (ref 145–400)
RBC: 4.73 10*6/uL (ref 3.70–5.45)
RDW: 13.1 % (ref 11.2–14.5)
WBC: 3.2 10*3/uL — AB (ref 3.9–10.3)
lymph#: 0.7 10*3/uL — ABNORMAL LOW (ref 0.9–3.3)
nRBC: 0 % (ref 0–0)

## 2014-11-30 LAB — COMPREHENSIVE METABOLIC PANEL (CC13)
ALT: 20 U/L (ref 0–55)
ANION GAP: 7 meq/L (ref 3–11)
AST: 18 U/L (ref 5–34)
Albumin: 4.1 g/dL (ref 3.5–5.0)
Alkaline Phosphatase: 103 U/L (ref 40–150)
BUN: 15.3 mg/dL (ref 7.0–26.0)
CHLORIDE: 106 meq/L (ref 98–109)
CO2: 27 meq/L (ref 22–29)
CREATININE: 0.9 mg/dL (ref 0.6–1.1)
Calcium: 9.6 mg/dL (ref 8.4–10.4)
EGFR: 66 mL/min/{1.73_m2} — ABNORMAL LOW (ref >90)
Glucose: 90 mg/dl (ref 70–140)
Potassium: 4.1 mEq/L (ref 3.5–5.1)
Sodium: 141 mEq/L (ref 136–145)
Total Bilirubin: 0.65 mg/dL (ref 0.20–1.20)
Total Protein: 7.1 g/dL (ref 6.4–8.3)

## 2014-11-30 MED ORDER — GABAPENTIN 300 MG PO CAPS: 900.0000 mg | ORAL_CAPSULE | Freq: Every day | ORAL | Status: DC

## 2014-11-30 NOTE — Telephone Encounter (Signed)
Gave avs & calendar For April 2017

## 2014-11-30 NOTE — Progress Notes (Signed)
[place]  [phone] [phone]) [phone]     ID: ANTANASIA KACZYNSKI OB: 1956/02/25  MR#: [id number]  CBS#:[id number]  PCP: Dwan Bolt, MD GYN:   SU: Rolm Bookbinder, MD OTHER MD: Ulyess Blossom, MD;  Franchot Gallo, MD, Leigh Aurora M.D.  CHIEF COMPLAINT:  Triple negative Right breast cancer  CURRENT TREATMENT: Observation   BREAST CANCER HISTORY: As per previously documented note:  Francene Castle (pronounced "line 'em") herself noted a change in her right breast sometime October 2014 but "my breasts are like bean bags" and she initially ignored it. After while she began to feel a pole when she lay on her right side at night so she scheduled herself for screening mammography at [facility] 02/08/2013 (prior mammogram had been April 2011), and this did show a possible mass in the right breast. Unilateral right diagnostic mammography and ultrasonography 02/28/2013 confirmed an irregular mass in the right breast upper outer quadrant measuring 2.4 cm. This was palpable at the 11:00 position. Ultrasound showed an irregular hypoechoic mass measuring 1.5 cm with no abnormal adenopathy in the right axilla.  Biopsy of the mass was performed the same day, and showed ([id number]) an invasive ductal carcinoma, grade not stated, E-cadherin strongly positive, estrogen and progesterone receptor negative with no HER-2 amplification, and an MIB-1 of 99%.  The patient met with Dr. Donne Hazel 03/14/2013 and he recommended primary systemic chemotherapy to decrease the size of the tumor and increase the chance of lumpectomy.   Subsequent history is as detailed below.  INTERVAL HISTORY: [name] returns today for follow-up of her breast cancer. She never mated to the dermatologist. She did see a rheumatologist and though she has quite a bit of arthritis symptoms she does not have rheumatoid arthritis. She was supposed to have had her mammogram before but is actually  scheduled for next week  REVIEW OF SYSTEMS: [name] continues to have a lot of joint pain in many places particularly her hands. The way she describes the symptoms there suggests that possibly there is some element of carpal tunnel. It could be related to the chemical she uses in housecleaning. She says her vision never straightened out after chemotherapy. She doesn't see double or have blackouts. It is just more that she cant see all that well. She has not seen an eye doctor in some time and possibly all that happening his presbyopia. She also feels since the chemotherapy her teeth have changed. The used to "home", but now she feels more like "crublyy". She is seeing her dentist regarding this. She is concerned about weight gain. Otherwise a detailed review of systems today was noncontributory  PAST MEDICAL HISTORY: Past Medical History  Diagnosis Date  . Hypertension   . Hyperlipidemia   . Thyroid disease   . Osteoporosis   . Hypothyroidism     hashimoto's  . Depression   . Pneumonia     2014  . Cold (disease) 03/21/12    couple of weeks ago - pt states much better now  . History of kidney stones     "passed 9 stones"  . GERD (gastroesophageal reflux disease)     no meds except occas tums  . Headache(784.0)     occas migraine  . Cancer     new dx of right breast cancer - planning chemo first  . Arthritis     oa; lower back pain - hx of injury yrs ago-occas flare ups  . Anemia     2005 -  related to heavy menses with the thyroid probl  . Chronic kidney disease     kidney stone  . Breast cancer 02/28/13    right, 11 o'clock  . Hx of radiation therapy 11/16/13- 12/21/13    right breast 5000 cGy in 25 sessions    PAST SURGICAL HISTORY:  (Updated 07/07/2013) Past Surgical History  Procedure Laterality Date  . Cesarean section  1987  . Tonsillectomy    . Uterine ablation  2008    no menstrual periods since procedure  . Portacath placement N/A 03/23/2013    Procedure: INSERTION  PORT-A-CATH;  Surgeon: Rolm Bookbinder, MD;  Location: [facility];  Service: General;  Laterality: N/A;  . Cystoscopy with retrograde pyelogram, ureteroscopy and stent placement Right 06/30/2013    Procedure: CYSTOSCOPY WITH RETROGRADE PYELOGRAM, URETEROSCOPY, STONE EXTRACTION AND  STENT PLACEMENT;  Surgeon: Franchot Gallo, MD;  Location: [facility];  Service: Urology;  Laterality: Right;  . Holmium laser application Right 1/61/0960    Procedure: HOLMIUM LASER APPLICATION;  Surgeon: Franchot Gallo, MD;  Location: [facility];  Service: Urology;  Laterality: Right;  . Breast lumpectomy with radioactive seed localization Right 09/01/2013    Procedure: BREAST LUMPECTOMY WITH RADIOACTIVE SEED LOCALIZATION;  Surgeon: Rolm Bookbinder, MD;  Location: [facility];  Service: General;  Laterality: Right;  . Axillary sentinel node biopsy Right 09/01/2013    Procedure: RIGHT AXILLARY SENTINEL NODE BIOPSY;  Surgeon: Rolm Bookbinder, MD;  Location: [facility];  Service: General;  Laterality: Right;  PECTORAL BLOCK WITH ANESTHESIA   . Port-a-cath removal Left 09/01/2013    Procedure: REMOVAL PORT-A-CATH;  Surgeon: Rolm Bookbinder, MD;  Location: [facility];  Service: General;  Laterality: Left;    FAMILY HISTORY Family History  Problem Relation Age of Onset  . Heart failure Mother   . Heart attack Father   . Autoimmune disease Maternal Aunt     lupus, scelraderma, reynolds disease, and others  . Stroke Paternal Uncle   . Heart failure Maternal Grandmother   . Heart attack Maternal Grandfather   . Throat cancer Paternal Grandmother     dx late 97s; non smoker or drinker  . Heart attack Paternal Grandfather   . Schizophrenia Son 68  . Heart attack Paternal Uncle    the patient's father died at the age of 79 from a myocardial infarction. The patient's mother died at the age of 35 with congestive heart failure. She also had a history of lupus. The patient had no  brothers or sisters. There is no history of breast or ovarian cancer in the family to her knowledge.  GYNECOLOGIC HISTORY:   (reviewed on 08/25/2013)  Menarche age 83, first live birth age 52. She is GX P2. She underwent endometrial ablation in 2009 and has not had a period since that time. She never took hormone replacement. She did use birth control pills remotely with no complications  SOCIAL HISTORY: (Reviewd 08/25/2013)  [name] works at [employer] for a variety of long-term clients. She does not work for a company. Her husband [name] is semiretired. He works for a company that [employer] data on [place]. There is a complex history that I am not detailing here. Son [name] is in [place] and works in Architect. Son [name] lives at home and is disabled with a diagnosis of paranoid schizophrenia. The patient has no grandchildren. She is not a church attender    ADVANCED DIRECTIVES: Not in place   HEALTH  MAINTENANCE: ( reviewed 08/25/2013 ) Social History  Substance Use Topics  . Smoking status: Former Smoker    Quit date: 03/17/1985  . Smokeless tobacco: Never Used  . Alcohol Use: No     Colonoscopy: Never  PAP: Remote  Bone density: November 2014, showed osteoporosis     Lipid panel:  Not on file, Dr. Wilson Singer   Allergies  Allergen Reactions  . Codeine Itching and Nausea Only    REACTION: Nausea and itching  . Compazine [Prochlorperazine Edisylate] Other (See Comments)    Head aches   . Other     Close contact with Some recycled plastics cause whelps  . Tape Rash    Needs OPSITE dressing with port access    Current Outpatient Prescriptions  Medication Sig Dispense Refill  . atorvastatin (LIPITOR) 20 MG tablet Take 20 mg by mouth every evening.     . gabapentin (NEURONTIN) 300 MG capsule Take 2 capsules (600 mg total) by mouth at bedtime. 60 capsule 11  . omeprazole (PRILOSEC) 40 MG capsule   3  . thyroid (ARMOUR) 90 MG tablet Take 90 mg by mouth  every morning.    . traMADol (ULTRAM) 50 MG tablet TAKE ONE TO TWO TABLETS BY MOUTH THREE TIMES DAILY AS NEEDED FOR MODERATE OR SEVERE  PAIN 100 tablet 0   No current facility-administered medications for this visit.    Objective: Middle-aged white woman    Filed Vitals:   11/30/14 1012  BP: 131/69  Pulse: 69  Temp: 99.1 F (37.3 C)  Resp: 18     Body mass index is 38.86 kg/(m^2).    ECOG FS: 1 Filed Weights   11/30/14 1012  Weight: 212 lb 8 oz (96.389 kg)   Sclerae unicteric, EOMs intact Oropharynx clear, slightly dry No cervical or supraclavicular adenopathy Lungs no rales or rhonchi Heart regular rate and rhythm Abd soft, obese, nontender, positive bowel sounds MSK no focal spinal tenderness, no upper extremity lymphedema Neuro: nonfocal, well oriented, positive affect Breasts: The right breast is status post lumpectomy and radiation. There is no evidence of local recurrence. The right axilla is benign. Left breast is unremarkable. Skin: There continues to be a 2 mm subcutaneous nodule in the left upper quadrant of the abdomen which appears unchanged from last year. I can no longer find the one that I palpated last year in the right upper quadrant of the abdomen. The skin lesion in the left abdomen is dark brown and scalloped and really needs to come off.    STUDIES: No results found.  LAB RESULTS:  Lab Results  Component Value Date   WBC 3.2* 11/30/2014   NEUTROABS 2.1 11/30/2014   HGB 14.4 11/30/2014   HCT 42.2 11/30/2014   MCV 89.2 11/30/2014   PLT 174 11/30/2014      Chemistry      Component Value Date/Time   NA 142 08/16/2014 0844   NA 145 08/31/2013 1340   K 4.2 08/16/2014 0844   K 4.4 08/31/2013 1340   CL 105 08/31/2013 1340   CO2 26 08/16/2014 0844   CO2 26 08/31/2013 1340   BUN 15.0 08/16/2014 0844   BUN 16 08/31/2013 1340   CREATININE 0.9 08/16/2014 0844   CREATININE 1.20* 08/31/2013 1340      Component Value Date/Time   CALCIUM 8.6  08/16/2014 0844   CALCIUM 9.5 08/31/2013 1340   ALKPHOS 95 08/16/2014 0844   ALKPHOS 65 05/27/2013 0530   AST 18 08/16/2014 0844  AST 7 05/27/2013 0530   ALT 18 08/16/2014 0844   ALT 11 05/27/2013 0530   BILITOT 0.70 08/16/2014 0844   BILITOT 0.5 05/27/2013 0530       ASSESSMENT: 59 y.o. BRCA1 and BRCA2 negative [place] woman   (1)  status post right breast biopsy 02/28/2013 for a clinical T2 N0, stage IIA invasive ductal carcinoma, grade not stated, triple negative, with an MIB-1 of 99%.  (2) treated in the neoadjuvant setting,   (a) completed 4 dose dense cycles of doxorubicin/ cyclophosphamide 05/19/2013. Cycle 4 was delayed one week because of bad weather  (b) received 7 of 12 planned weekly doses of carboplatin/ paclitaxel, with paclitaxel given alone with the 8th cycle, after which chemotherapy was stopped because of cytopenias and neuropathy; final dose 08/04/2013.  (3) status post right lumpectomy and sentinel lymph node sampling 09/01/2013 showing a complete pathologic response.  (4) adjuvant radiation therapy completed 12/21/2013.  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 Diona Fanti  (7) thyroiditis: followed by Dr. Wilson Singer  PLAN: [name] is now little over a year out from her definitive surgery for breast cancer with no evidence of disease recurrence. This is very favorable.  She does have some issues which may be related to her earlier chemotherapy. Her total white cell count is slightly low, and that is not uncommon after chemotherapy. It does not mean that she would not be able to mount a good response to infection. She thinks her teeth have been affected by the chemotherapy and she is getting this looked after by her dentist. She also has had some vision problems and she needs to see an eye doctor to get tested for presbyopia and possibly other issues.  I am  very concerned about the skin lesion in her left upper quadrant of the abdomen. Most likely it is benign but I really do feel he needs to come off. She does not want me to make the referral to dermatology however because due to her insurance as to go through her primary care physician. She will discuss that with Dr. Wilson Singer at their upcoming visit.  Otherwise she will have her mammogram next week, she will see [place] again in April, and she will see me again October of next G her. She knows to call for any problems that may develop before then. melanotic and Chauncey Cruel, MD     11/30/2014

## 2014-12-04 ENCOUNTER — Ambulatory Visit
Admission: RE | Admit: 2014-12-04 | Discharge: 2014-12-04 | Disposition: A | Discharge status: Home / Self Care | Payer: 59 | Source: Ambulatory Visit | Attending: Oncology | Admitting: Oncology

## 2014-12-04 DIAGNOSIS — Z9889 Other specified postprocedural states: Secondary | ICD-10-CM

## 2014-12-04 DIAGNOSIS — C50911 Malignant neoplasm of unspecified site of right female breast: Secondary | ICD-10-CM

## 2015-06-21 ENCOUNTER — Other Ambulatory Visit (HOSPITAL_BASED_OUTPATIENT_CLINIC_OR_DEPARTMENT_OTHER): Payer: BLUE CROSS/BLUE SHIELD

## 2015-06-21 DIAGNOSIS — C50411 Malignant neoplasm of upper-outer quadrant of right female breast: Secondary | ICD-10-CM

## 2015-06-21 LAB — COMPREHENSIVE METABOLIC PANEL
ALT: 21 U/L (ref 0–55)
AST: 17 U/L (ref 5–34)
Albumin: 3.9 g/dL (ref 3.5–5.0)
Alkaline Phosphatase: 85 U/L (ref 40–150)
Anion Gap: 9 mEq/L (ref 3–11)
BILIRUBIN TOTAL: 0.48 mg/dL (ref 0.20–1.20)
BUN: 12.9 mg/dL (ref 7.0–26.0)
CO2: 28 meq/L (ref 22–29)
CREATININE: 1 mg/dL (ref 0.6–1.1)
Calcium: 9.5 mg/dL (ref 8.4–10.4)
Chloride: 108 mEq/L (ref 98–109)
EGFR: 59 mL/min/{1.73_m2} — ABNORMAL LOW (ref >90)
GLUCOSE: 101 mg/dL (ref 70–140)
Potassium: 4.4 mEq/L (ref 3.5–5.1)
SODIUM: 145 meq/L (ref 136–145)
TOTAL PROTEIN: 7.2 g/dL (ref 6.4–8.3)

## 2015-06-21 LAB — CBC WITH DIFFERENTIAL/PLATELET
BASO%: 0.7 % (ref 0.0–2.0)
Basophils Absolute: 0 10*3/uL (ref 0.0–0.1)
EOS%: 2.4 % (ref 0.0–7.0)
Eosinophils Absolute: 0.1 10*3/uL (ref 0.0–0.5)
HEMATOCRIT: 42.9 % (ref 34.8–46.6)
HEMOGLOBIN: 14.7 g/dL (ref 11.6–15.9)
LYMPH#: 0.9 10*3/uL (ref 0.9–3.3)
LYMPH%: 20.9 % (ref 14.0–49.7)
MCH: 30.8 pg (ref 25.1–34.0)
MCHC: 34.3 g/dL (ref 31.5–36.0)
MCV: 89.9 fL (ref 79.5–101.0)
MONO#: 0.3 10*3/uL (ref 0.1–0.9)
MONO%: 6.4 % (ref 0.0–14.0)
NEUT%: 69.6 % (ref 38.4–76.8)
NEUTROS ABS: 3.1 10*3/uL (ref 1.5–6.5)
NRBC: 0 % (ref 0–0)
Platelets: 194 10*3/uL (ref 145–400)
RBC: 4.77 10*6/uL (ref 3.70–5.45)
RDW: 12.9 % (ref 11.2–14.5)
WBC: 4.5 10*3/uL (ref 3.9–10.3)

## 2015-06-28 ENCOUNTER — Encounter: Payer: Self-pay | Admitting: Nurse Practitioner

## 2015-06-28 ENCOUNTER — Ambulatory Visit (HOSPITAL_BASED_OUTPATIENT_CLINIC_OR_DEPARTMENT_OTHER): Payer: BLUE CROSS/BLUE SHIELD | Admitting: Nurse Practitioner

## 2015-06-28 ENCOUNTER — Telehealth: Payer: Self-pay | Admitting: Nurse Practitioner

## 2015-06-28 VITALS — BP 148/77 | HR 64 | Temp 98.0°F | Resp 18 | Ht 62.0 in | Wt 228.7 lb

## 2015-06-28 DIAGNOSIS — F4321 Adjustment disorder with depressed mood: Secondary | ICD-10-CM | POA: Diagnosis not present

## 2015-06-28 DIAGNOSIS — C50411 Malignant neoplasm of upper-outer quadrant of right female breast: Secondary | ICD-10-CM

## 2015-06-28 DIAGNOSIS — F329 Major depressive disorder, single episode, unspecified: Secondary | ICD-10-CM | POA: Diagnosis not present

## 2015-06-28 DIAGNOSIS — F32A Depression, unspecified: Secondary | ICD-10-CM | POA: Insufficient documentation

## 2015-06-28 DIAGNOSIS — Z853 Personal history of malignant neoplasm of breast: Secondary | ICD-10-CM | POA: Diagnosis not present

## 2015-06-28 NOTE — Progress Notes (Signed)
[place]  [phone] [phone]) [phone]    ID: Sydney Ford OB: 08-Nov-1955  MR#: [id number]  GMW#:[id number]  PCP: Dwan Bolt, MD GYN:   SU: Rolm Bookbinder, MD OTHER MD: Ulyess Blossom, MD;  Franchot Gallo, MD, Leigh Aurora M.D.  CHIEF COMPLAINT:  Triple negative Right breast cancer  CURRENT TREATMENT: Observation  BREAST CANCER HISTORY: As per previously documented note:  Sydney Ford (pronounced "line 'em") herself noted a change in her right breast sometime October 2014 but "my breasts are like bean bags" and she initially ignored it. After while she began to feel a pole when she lay on her right side at night so she scheduled herself for screening mammography at [facility] 02/08/2013 (prior mammogram had been April 2011), and this did show a possible mass in the right breast. Unilateral right diagnostic mammography and ultrasonography 02/28/2013 confirmed an irregular mass in the right breast upper outer quadrant measuring 2.4 cm. This was palpable at the 11:00 position. Ultrasound showed an irregular hypoechoic mass measuring 1.5 cm with no abnormal adenopathy in the right axilla.  Biopsy of the mass was performed the same day, and showed ([id number]) an invasive ductal carcinoma, grade not stated, E-cadherin strongly positive, estrogen and progesterone receptor negative with no HER-2 amplification, and an MIB-1 of 99%.  The patient met with Dr. Donne Hazel 03/14/2013 and he recommended primary systemic chemotherapy to decrease the size of the tumor and increase the chance of lumpectomy.   Subsequent history is as detailed below.  INTERVAL HISTORY: Sydney Ford returns today for follow-up of her breast cancer. The interval history is remarkable for increased depression. Her dog died 4 months ago, who she cherish as one of her own children. The problem is compounded because she feels alone. Her husband is reportedly an alcoholic, and her  adult son who has schizophrenia also lives with them. She has gained at least 25lbs and can't go on walks any more because they remind her of her dog. She was started on wellbutrin by her PCP. She has only been on it for 3 weeks, but does not feel that it is effective.   REVIEW OF SYSTEMS: Physically, Sydney Ford is doing reasonably well. She denies fevers, chills, or changes in bowels or bladder habits. She is nauseous frequently for various reasons, ranging from medicines to foods that don't agree with her. She continues to work full time, and is chronically fatigued. Her vision is poor, but she denies unusual headaches, dizziness, or weakness. She has joint pain to several areas, including her hands. She takes mobic and tramadol for pain. A detailed review of systems is otherwise stable.  PAST MEDICAL HISTORY: Past Medical History  Diagnosis Date  . Hypertension   . Hyperlipidemia   . Thyroid disease   . Osteoporosis   . Hypothyroidism     hashimoto's  . Depression   . Pneumonia     2014  . Cold (disease) 03/21/12    couple of weeks ago - pt states much better now  . History of kidney stones     "passed 9 stones"  . GERD (gastroesophageal reflux disease)     no meds except occas tums  . Headache(784.0)     occas migraine  . Cancer     new dx of right breast cancer - planning chemo first  . Arthritis     oa; lower back pain - hx of injury yrs ago-occas flare ups  . Anemia     2005 -  related to heavy menses with the thyroid probl  . Chronic kidney disease     kidney stone  . Breast cancer 02/28/13    right, 11 o'clock  . Hx of radiation therapy 11/16/13- 12/21/13    right breast 5000 cGy in 25 sessions    PAST SURGICAL HISTORY:  (Updated 07/07/2013) Past Surgical History  Procedure Laterality Date  . Cesarean section  1987  . Tonsillectomy    . Uterine ablation  2008    no menstrual periods since procedure  . Portacath placement N/A 03/23/2013    Procedure: INSERTION PORT-A-CATH;   Surgeon: Rolm Bookbinder, MD;  Location: [facility];  Service: General;  Laterality: N/A;  . Cystoscopy with retrograde pyelogram, ureteroscopy and stent placement Right 06/30/2013    Procedure: CYSTOSCOPY WITH RETROGRADE PYELOGRAM, URETEROSCOPY, STONE EXTRACTION AND  STENT PLACEMENT;  Surgeon: Franchot Gallo, MD;  Location: [facility];  Service: Urology;  Laterality: Right;  . Holmium laser application Right 1/61/0960    Procedure: HOLMIUM LASER APPLICATION;  Surgeon: Franchot Gallo, MD;  Location: [facility];  Service: Urology;  Laterality: Right;  . Breast lumpectomy with radioactive seed localization Right 09/01/2013    Procedure: BREAST LUMPECTOMY WITH RADIOACTIVE SEED LOCALIZATION;  Surgeon: Rolm Bookbinder, MD;  Location: [facility];  Service: General;  Laterality: Right;  . Axillary sentinel node biopsy Right 09/01/2013    Procedure: RIGHT AXILLARY SENTINEL NODE BIOPSY;  Surgeon: Rolm Bookbinder, MD;  Location: [facility];  Service: General;  Laterality: Right;  PECTORAL BLOCK WITH ANESTHESIA   . Port-a-cath removal Left 09/01/2013    Procedure: REMOVAL PORT-A-CATH;  Surgeon: Rolm Bookbinder, MD;  Location: [facility];  Service: General;  Laterality: Left;    FAMILY HISTORY Family History  Problem Relation Age of Onset  . Heart failure Mother   . Heart attack Father   . Autoimmune disease Maternal Aunt     lupus, scelraderma, reynolds disease, and others  . Stroke Paternal Uncle   . Heart failure Maternal Grandmother   . Heart attack Maternal Grandfather   . Throat cancer Paternal Grandmother     dx late 13s; non smoker or drinker  . Heart attack Paternal Grandfather   . Schizophrenia Son 48  . Heart attack Paternal Uncle    the patient's father died at the age of 30 from a myocardial infarction. The patient's mother died at the age of 26 with congestive heart failure. She also had a history of lupus. The patient had no brothers or  sisters. There is no history of breast or ovarian cancer in the family to her knowledge.  GYNECOLOGIC HISTORY:   (reviewed on 08/25/2013)  Menarche age 32, first live birth age 79. She is GX P2. She underwent endometrial ablation in 2009 and has not had a period since that time. She never took hormone replacement. She did use birth control pills remotely with no complications  SOCIAL HISTORY: (Reviewd 08/25/2013)  Sydney Ford works at [employer] for a variety of long-term clients. She does not work for a company. Her husband [name] is semiretired. He works for a company that [employer] data on [place]. There is a complex history that I am not detailing here. Son [name] is in [place] and works in Architect. Son [name] lives at home and is disabled with a diagnosis of paranoid schizophrenia. The patient has no grandchildren. She is not a church attender    ADVANCED DIRECTIVES: Not in place   HEALTH  MAINTENANCE: ( reviewed 08/25/2013 ) Social History  Substance Use Topics  . Smoking status: Former Smoker    Quit date: 03/17/1985  . Smokeless tobacco: Never Used  . Alcohol Use: No     Colonoscopy: Never  PAP: Remote  Bone density: November 2014, showed osteoporosis     Lipid panel:  Not on file, Dr. Wilson Singer   Allergies  Allergen Reactions  . Codeine Itching and Nausea Only    REACTION: Nausea and itching  . Compazine [Prochlorperazine Edisylate] Other (See Comments)    Head aches   . Other     Close contact with Some recycled plastics cause whelps  . Tape Rash    Needs OPSITE dressing with port access    Current Outpatient Prescriptions  Medication Sig Dispense Refill  . aspirin EC 81 MG tablet Take 81 mg by mouth daily.    Marland Kitchen atorvastatin (LIPITOR) 20 MG tablet Take 20 mg by mouth every evening.     Marland Kitchen buPROPion (WELLBUTRIN XL) 150 MG 24 hr tablet Take 150 mg by mouth daily.  3  . gabapentin (NEURONTIN) 300 MG capsule Take 3 capsules (900 mg total) by  mouth at bedtime. 90 capsule 11  . meloxicam (MOBIC) 15 MG tablet Take 15 mg by mouth daily.   0  . omeprazole (PRILOSEC) 40 MG capsule Take 40 mg by mouth daily.   3  . thyroid (ARMOUR) 90 MG tablet Take 90 mg by mouth every morning.    . traMADol (ULTRAM) 50 MG tablet TAKE ONE TO TWO TABLETS BY MOUTH THREE TIMES DAILY AS NEEDED FOR MODERATE OR SEVERE  PAIN 100 tablet 0   No current facility-administered medications for this visit.    Objective: Middle-aged white woman    Filed Vitals:   06/28/15 0901  BP: 148/77  Pulse: 64  Temp: 98 F (36.7 C)  Resp: 18     Body mass index is 41.82 kg/(m^2).    ECOG FS: 1 Filed Weights   06/28/15 0901  Weight: 228 lb 11.2 oz (103.738 kg)   Skin: warm, dry  HEENT: sclerae anicteric, conjunctivae pink, oropharynx clear. No thrush or mucositis.  Lymph Nodes: No cervical or supraclavicular lymphadenopathy  Lungs: clear to auscultation bilaterally, no rales, wheezes, or rhonci  Heart: regular rate and rhythm  Abdomen: round, soft, non tender, positive bowel sounds  Musculoskeletal: No focal spinal tenderness, no peripheral edema  Neuro: non focal, well oriented, positive affect  Breasts: right breast status post lumpectomy and radiation. No evidence of recurrent diease. Right axilla benign. Left breast unremarkable.   LAB RESULTS:  Lab Results  Component Value Date   WBC 4.5 06/21/2015   NEUTROABS 3.1 06/21/2015   HGB 14.7 06/21/2015   HCT 42.9 06/21/2015   MCV 89.9 06/21/2015   PLT 194 06/21/2015      Chemistry      Component Value Date/Time   NA 145 06/21/2015 0812   NA 145 08/31/2013 1340   K 4.4 06/21/2015 0812   K 4.4 08/31/2013 1340   CL 105 08/31/2013 1340   CO2 28 06/21/2015 0812   CO2 26 08/31/2013 1340   BUN 12.9 06/21/2015 0812   BUN 16 08/31/2013 1340   CREATININE 1.0 06/21/2015 0812   CREATININE 1.20* 08/31/2013 1340      Component Value Date/Time   CALCIUM 9.5 06/21/2015 0812   CALCIUM 9.5 08/31/2013 1340    ALKPHOS 85 06/21/2015 0812   ALKPHOS 65 05/27/2013 0530   AST 17 06/21/2015 9379  AST 7 05/27/2013 0530   ALT 21 06/21/2015 0812   ALT 11 05/27/2013 0530   BILITOT 0.48 06/21/2015 0812   BILITOT 0.5 05/27/2013 0530      STUDIES: No results found.  EXAM: DIGITAL DIAGNOSTIC BILATERAL MAMMOGRAM WITH 3D TOMOSYNTHESIS AND CAD  COMPARISON: Previous exam(s).  ACR Breast Density Category b: There are scattered areas of fibroglandular density.  FINDINGS: Post lumpectomy changes on the right. No new findings suspicious for malignancy in either breast.  Mammographic images were processed with CAD.  IMPRESSION: No evidence of malignancy.  RECOMMENDATION: Bilateral diagnostic mammogram in 1 year.  I have discussed the findings and recommendations with the patient. Results were also provided in writing at the conclusion of the visit. If applicable, a reminder letter will be sent to the patient regarding the next appointment.  BI-RADS CATEGORY 2: Benign.   Electronically Signed  By: Claudie Revering M.D.  On: 12/04/2014 16:12  ASSESSMENT: 60 y.o. BRCA1 and BRCA2 negative [place] woman   (1)  status post right breast biopsy 02/28/2013 for a clinical T2 N0, stage IIA invasive ductal carcinoma, grade not stated, triple negative, with an MIB-1 of 99%.  (2) treated in the neoadjuvant setting,   (a) completed 4 dose dense cycles of doxorubicin/ cyclophosphamide 05/19/2013. Cycle 4 was delayed one week because of bad weather  (b) received 7 of 12 planned weekly doses of carboplatin/ paclitaxel, with paclitaxel given alone with the 8th cycle, after which chemotherapy was stopped because of cytopenias and neuropathy; final dose 08/04/2013.  (3) status post right lumpectomy and sentinel lymph node sampling 09/01/2013 showing a complete pathologic response.  (4) adjuvant radiation therapy completed 12/21/2013.  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 Diona Fanti  (7) thyroiditis: followed by Dr. Wilson Singer  PLAN: Sydney Ford is doing well as far as her breast cancer is concerned. She is now almost 2 years out from her definitive surgery with no evidence of recurrent disease. Her most recent mammogram was benign. The labs were reviewed in detail and were entirely normal.   It is clear that Sydney Ford's late dog was equivalent to family. I challenged her to lean into that attachment, instead of feeling embarrassed by her grief. She is uninterested in adopting a new pet or even volunteering at a shelter. She believes her depression is manifesting itself in the form of tears over her dog, but is likely also tied to home stressors that she never has time to work through. I offered the assistance of a Education officer, museum, but she declined. She will continue on the 171m wellbutrin prescribed by her PCP, Dr. KWilson Singer  Sydney Ford is due for a repeat mammogram in September. She will return in October for follow up with Dr. MDoris Cheadle She understands and agrees with this plan. She has been encouraged to call with any issues that might arise before her next visit here.   Total time spent in appointment was 25 minutes, with greater than 50% of the time spent face to face with the patient.    HLaurie Panda NP    06/28/2015

## 2015-06-28 NOTE — Telephone Encounter (Signed)
appt made and avs printed °

## 2015-08-02 DIAGNOSIS — Z23 Encounter for immunization: Secondary | ICD-10-CM | POA: Diagnosis not present

## 2015-08-02 DIAGNOSIS — E789 Disorder of lipoprotein metabolism, unspecified: Secondary | ICD-10-CM | POA: Diagnosis not present

## 2015-08-02 DIAGNOSIS — E032 Hypothyroidism due to medicaments and other exogenous substances: Secondary | ICD-10-CM | POA: Diagnosis not present

## 2015-08-02 DIAGNOSIS — M25519 Pain in unspecified shoulder: Secondary | ICD-10-CM | POA: Diagnosis not present

## 2015-08-30 DIAGNOSIS — M6530 Trigger finger, unspecified finger: Secondary | ICD-10-CM | POA: Diagnosis not present

## 2015-08-30 DIAGNOSIS — M67912 Unspecified disorder of synovium and tendon, left shoulder: Secondary | ICD-10-CM | POA: Diagnosis not present

## 2015-10-31 ENCOUNTER — Other Ambulatory Visit: Payer: Self-pay | Admitting: Oncology

## 2015-10-31 DIAGNOSIS — Z853 Personal history of malignant neoplasm of breast: Secondary | ICD-10-CM

## 2015-11-01 DIAGNOSIS — E789 Disorder of lipoprotein metabolism, unspecified: Secondary | ICD-10-CM | POA: Diagnosis not present

## 2015-11-01 DIAGNOSIS — E032 Hypothyroidism due to medicaments and other exogenous substances: Secondary | ICD-10-CM | POA: Diagnosis not present

## 2015-11-30 DIAGNOSIS — H9313 Tinnitus, bilateral: Secondary | ICD-10-CM | POA: Diagnosis not present

## 2015-12-06 ENCOUNTER — Ambulatory Visit
Admission: RE | Admit: 2015-12-06 | Discharge: 2015-12-06 | Disposition: A | Discharge status: Home / Self Care | Payer: BLUE CROSS/BLUE SHIELD | Source: Ambulatory Visit | Attending: Oncology | Admitting: Oncology

## 2015-12-06 DIAGNOSIS — R928 Other abnormal and inconclusive findings on diagnostic imaging of breast: Secondary | ICD-10-CM | POA: Diagnosis not present

## 2015-12-06 DIAGNOSIS — Z853 Personal history of malignant neoplasm of breast: Secondary | ICD-10-CM

## 2015-12-16 ENCOUNTER — Other Ambulatory Visit: Payer: Self-pay | Admitting: Oncology

## 2015-12-24 DIAGNOSIS — M170 Bilateral primary osteoarthritis of knee: Secondary | ICD-10-CM | POA: Diagnosis not present

## 2015-12-24 DIAGNOSIS — M25512 Pain in left shoulder: Secondary | ICD-10-CM | POA: Diagnosis not present

## 2015-12-24 DIAGNOSIS — M25561 Pain in right knee: Secondary | ICD-10-CM | POA: Diagnosis not present

## 2015-12-24 DIAGNOSIS — M7582 Other shoulder lesions, left shoulder: Secondary | ICD-10-CM | POA: Diagnosis not present

## 2015-12-26 ENCOUNTER — Other Ambulatory Visit: Payer: Self-pay

## 2015-12-26 DIAGNOSIS — C50411 Malignant neoplasm of upper-outer quadrant of right female breast: Secondary | ICD-10-CM

## 2015-12-27 ENCOUNTER — Ambulatory Visit (HOSPITAL_BASED_OUTPATIENT_CLINIC_OR_DEPARTMENT_OTHER): Payer: BLUE CROSS/BLUE SHIELD | Admitting: Oncology

## 2015-12-27 ENCOUNTER — Other Ambulatory Visit (HOSPITAL_BASED_OUTPATIENT_CLINIC_OR_DEPARTMENT_OTHER): Payer: BLUE CROSS/BLUE SHIELD

## 2015-12-27 VITALS — BP 129/91 | HR 65 | Temp 97.6°F | Resp 18 | Ht 62.0 in | Wt 235.5 lb

## 2015-12-27 DIAGNOSIS — C50411 Malignant neoplasm of upper-outer quadrant of right female breast: Secondary | ICD-10-CM

## 2015-12-27 DIAGNOSIS — N189 Chronic kidney disease, unspecified: Secondary | ICD-10-CM | POA: Diagnosis not present

## 2015-12-27 DIAGNOSIS — Z171 Estrogen receptor negative status [ER-]: Secondary | ICD-10-CM

## 2015-12-27 LAB — COMPREHENSIVE METABOLIC PANEL
ALBUMIN: 3.8 g/dL (ref 3.5–5.0)
ALK PHOS: 93 U/L (ref 40–150)
ALT: 15 U/L (ref 0–55)
ANION GAP: 11 meq/L (ref 3–11)
AST: 11 U/L (ref 5–34)
BILIRUBIN TOTAL: 0.75 mg/dL (ref 0.20–1.20)
BUN: 21.7 mg/dL (ref 7.0–26.0)
CALCIUM: 9.6 mg/dL (ref 8.4–10.4)
CO2: 26 meq/L (ref 22–29)
CREATININE: 1 mg/dL (ref 0.6–1.1)
Chloride: 106 mEq/L (ref 98–109)
EGFR: 62 mL/min/{1.73_m2} — AB (ref >90)
Glucose: 91 mg/dl (ref 70–140)
Potassium: 4.7 mEq/L (ref 3.5–5.1)
Sodium: 143 mEq/L (ref 136–145)
TOTAL PROTEIN: 7.1 g/dL (ref 6.4–8.3)

## 2015-12-27 LAB — CBC WITH DIFFERENTIAL/PLATELET
BASO%: 0.5 % (ref 0.0–2.0)
Basophils Absolute: 0 10*3/uL (ref 0.0–0.1)
EOS ABS: 0.1 10*3/uL (ref 0.0–0.5)
EOS%: 1.4 % (ref 0.0–7.0)
HCT: 41.9 % (ref 34.8–46.6)
HEMOGLOBIN: 14.3 g/dL (ref 11.6–15.9)
LYMPH%: 17.5 % (ref 14.0–49.7)
MCH: 30.7 pg (ref 25.1–34.0)
MCHC: 34.1 g/dL (ref 31.5–36.0)
MCV: 89.9 fL (ref 79.5–101.0)
MONO#: 0.4 10*3/uL (ref 0.1–0.9)
MONO%: 6.6 % (ref 0.0–14.0)
NEUT%: 74 % (ref 38.4–76.8)
NEUTROS ABS: 4.7 10*3/uL (ref 1.5–6.5)
Platelets: 207 10*3/uL (ref 145–400)
RBC: 4.66 10*6/uL (ref 3.70–5.45)
RDW: 12.7 % (ref 11.2–14.5)
WBC: 6.4 10*3/uL (ref 3.9–10.3)
lymph#: 1.1 10*3/uL (ref 0.9–3.3)

## 2015-12-27 NOTE — Progress Notes (Signed)
[place]  [phone] Fax:([phone]    ID: ARLYNE BRANDES OB: 25-Mar-1955  MR#: [id number]  TDH#:[id number]  PCP: Dwan Bolt, MD GYN:   SU: Rolm Bookbinder, MD OTHER MD: Ulyess Blossom, MD;  Franchot Gallo, MD, Leigh Aurora M.D.  CHIEF COMPLAINT:  Triple negative Right breast cancer  CURRENT TREATMENT: Observation  BREAST CANCER HISTORY: As per previously documented note:  Francene Castle (pronounced "line 'em") herself noted a change in her right breast sometime October 2014 but "my breasts are like bean bags" and she initially ignored it. After while she began to feel a pole when she lay on her right side at night so she scheduled herself for screening mammography at the [facility] 02/08/2013 (prior mammogram had been April 2011), and this did show a possible mass in the right breast. Unilateral right diagnostic mammography and ultrasonography 02/28/2013 confirmed an irregular mass in the right breast upper outer quadrant measuring 2.4 cm. This was palpable at the 11:00 position. Ultrasound showed an irregular hypoechoic mass measuring 1.5 cm with no abnormal adenopathy in the right axilla.  Biopsy of the mass was performed the same day, and showed ([id number]) an invasive ductal carcinoma, grade not stated, E-cadherin strongly positive, estrogen and progesterone receptor negative with no HER-2 amplification, and an MIB-1 of 99%.  The patient met with Dr. Donne Hazel 03/14/2013 and he recommended primary systemic chemotherapy to decrease the size of the tumor and increase the chance of lumpectomy.   Subsequent history is as detailed below.  INTERVAL HISTORY: [name] for follow-up of her estrogen receptor negative breast cancer. From a breast cancer point of view she is doing fine. She continues to work full-time at [employer]. There are significant social problems, which continue. She is doing very best she can under the  circumstances.  REVIEW OF SYSTEMS: [name] has pain in the right axilla. She understands this is postoperative and does not indicate breast cancer recurrence. It is fairly constant. She has some tinnitus and she thinks possibly the Wellbutrin might be the cause. She is trying to wean herself off that medication. She still has some tingling in her feet at night. It tends to occur after she takes the Neurontin and she wonders if instead of helping the Neurontin is hurting. She is experimenting with that medication as well. Aside from these issues a detailed review of systems today was stable  PAST MEDICAL HISTORY: Past Medical History:  Diagnosis Date  . Anemia    2005 - related to heavy menses with the thyroid probl  . Arthritis    oa; lower back pain - hx of injury yrs ago-occas flare ups  . Breast cancer ([place]) 02/28/13   right, 11 o'clock  . Cancer [facility])    new dx of right breast cancer - planning chemo first  . Chronic kidney disease    kidney stone  . Cold (disease) 03/21/12   couple of weeks ago - pt states much better now  . Depression   . GERD (gastroesophageal reflux disease)    no meds except occas tums  . Headache(784.0)    occas migraine  . History of kidney stones    "passed 9 stones"  . Hx of radiation therapy 11/16/13- 12/21/13   right breast 5000 cGy in 25 sessions  . Hyperlipidemia   . Hypertension   . Hypothyroidism    hashimoto's  . Osteoporosis   . Pneumonia    2014  . Thyroid disease  PAST SURGICAL HISTORY:  (Updated 07/07/2013) Past Surgical History:  Procedure Laterality Date  . AXILLARY SENTINEL NODE BIOPSY Right 09/01/2013   Procedure: RIGHT AXILLARY SENTINEL NODE BIOPSY;  Surgeon: Rolm Bookbinder, MD;  Location: [place];  Service: General;  Laterality: Right;  PECTORAL BLOCK WITH ANESTHESIA   . BREAST LUMPECTOMY WITH RADIOACTIVE SEED LOCALIZATION Right 09/01/2013   Procedure: BREAST LUMPECTOMY WITH RADIOACTIVE SEED LOCALIZATION;   Surgeon: Rolm Bookbinder, MD;  Location: [place];  Service: General;  Laterality: Right;  . CESAREAN SECTION  1987  . CYSTOSCOPY WITH RETROGRADE PYELOGRAM, URETEROSCOPY AND STENT PLACEMENT Right 06/30/2013   Procedure: CYSTOSCOPY WITH RETROGRADE PYELOGRAM, URETEROSCOPY, STONE EXTRACTION AND  STENT PLACEMENT;  Surgeon: Franchot Gallo, MD;  [facility];  Service: Urology;  Laterality: Right;  . HOLMIUM LASER APPLICATION Right 10/25/5724   Procedure: HOLMIUM LASER APPLICATION;  Surgeon: Franchot Gallo, MD;  [facility];  Service: Urology;  Laterality: Right;  . PORT-A-CATH REMOVAL Left 09/01/2013   Procedure: REMOVAL PORT-A-CATH;  Surgeon: Rolm Bookbinder, MD;  Location: [place];  Service: General;  Laterality: Left;  . PORTACATH PLACEMENT N/A 03/23/2013   Procedure: INSERTION PORT-A-CATH;  Surgeon: Rolm Bookbinder, MD;  [facility];  Service: General;  Laterality: N/A;  . TONSILLECTOMY    . uterine ablation  2008   no menstrual periods since procedure    FAMILY HISTORY Family History  Problem Relation Age of Onset  . Heart failure Mother   . Heart attack Father   . Autoimmune disease Maternal Aunt     lupus, scelraderma, reynolds disease, and others  . Stroke Paternal Uncle   . Heart failure Maternal Grandmother   . Heart attack Maternal Grandfather   . Throat cancer Paternal Grandmother     dx late 59s; non smoker or drinker  . Heart attack Paternal Grandfather   . Schizophrenia Son 60  . Heart attack Paternal Uncle    the patient's father died at the age of 22 from a myocardial infarction. The patient's mother died at the age of 51 with congestive heart failure. She also had a history of lupus. The patient had no brothers or sisters. There is no history of breast or ovarian cancer in the family to her knowledge.  GYNECOLOGIC HISTORY:   (reviewed on 08/25/2013)  Menarche age 57, first live birth age 56. She is GX P2. She  underwent endometrial ablation in 2009 and has not had a period since that time. She never took hormone replacement. She did use birth control pills remotely with no complications  SOCIAL HISTORY: (Reviewd 08/25/2013)  [name] works at [employer] for a variety of long-term clients. She does not work for a company. Her husband [name] is semiretired. He works for a company that [employer] data on [place]. There is a complex history that I am not detailing here. Son [name] is in [place] and works in Architect. Son [name] lives at home and is disabled with a diagnosis of paranoid schizophrenia. The patient has no grandchildren. She is not a church attender    ADVANCED DIRECTIVES: Not in place   HEALTH MAINTENANCE: ( reviewed 08/25/2013 ) Social History  Substance Use Topics  . Smoking status: Former Smoker    Quit date: 03/17/1985  . Smokeless tobacco: Never Used  . Alcohol use No     Colonoscopy: Never  PAP: Remote  Bone density: November 2014, showed osteoporosis     Lipid panel:  Not  on file, Dr. Wilson Singer   Allergies  Allergen Reactions  . Codeine Itching and Nausea Only    REACTION: Nausea and itching  . Compazine [Prochlorperazine Edisylate] Other (See Comments)    Head aches   . Other     Close contact with Some recycled plastics cause whelps  . Tape Rash    Needs OPSITE dressing with port access    Current Outpatient Prescriptions  Medication Sig Dispense Refill  . aspirin EC 81 MG tablet Take 81 mg by mouth daily.    Marland Kitchen atorvastatin (LIPITOR) 20 MG tablet Take 20 mg by mouth every evening.     Marland Kitchen buPROPion (WELLBUTRIN XL) 150 MG 24 hr tablet Take 150 mg by mouth daily.  3  . gabapentin (NEURONTIN) 300 MG capsule TAKE 3 CAPSULES BY MOUTH AT BEDTIME 90 capsule 8  . meloxicam (MOBIC) 15 MG tablet Take 15 mg by mouth daily.   0  . omeprazole (PRILOSEC) 40 MG capsule Take 40 mg by mouth daily.   3  . thyroid (ARMOUR) 90 MG tablet Take 90 mg by mouth  every morning.    . traMADol (ULTRAM) 50 MG tablet TAKE ONE TO TWO TABLETS BY MOUTH THREE TIMES DAILY AS NEEDED FOR MODERATE OR SEVERE  PAIN 100 tablet 0   No current facility-administered medications for this visit.     Objective: Middle-aged white woman    Vitals:   12/27/15 0929  BP: (!) 129/91  Pulse: 65  Resp: 18  Temp: 97.6 F (36.4 C)     Body mass index is 43.07 kg/m.    ECOG FS: 1 Filed Weights   12/27/15 0929  Weight: 235 lb 8 oz (106.8 kg)   Sclerae unicteric, pupils round and equal Oropharynx clear and moist-- no thrush or other lesions No cervical or supraclavicular adenopathy Lungs no rales or rhonchi Heart regular rate and rhythm Abd soft, nontender, positive bowel sounds MSK no focal spinal tenderness, no upper extremity lymphedema Neuro: nonfocal, well oriented, appropriate affect Breasts: The right breast is status post lumpectomy and radiation. It is smaller than the left but preserves a normal contour. There is no evidence of disease recurrence. The right axilla is benign. Left breast is unremarkable.    LAB RESULTS:  Lab Results  Component Value Date   WBC 6.4 12/27/2015   NEUTROABS 4.7 12/27/2015   HGB 14.3 12/27/2015   HCT 41.9 12/27/2015   MCV 89.9 12/27/2015   PLT 207 12/27/2015      Chemistry      Component Value Date/Time   NA 143 12/27/2015 0805   K 4.7 12/27/2015 0805   CL 105 08/31/2013 1340   CO2 26 12/27/2015 0805   BUN 21.7 12/27/2015 0805   CREATININE 1.0 12/27/2015 0805      Component Value Date/Time   CALCIUM 9.6 12/27/2015 0805   ALKPHOS 93 12/27/2015 0805   AST 11 12/27/2015 0805   ALT 15 12/27/2015 0805   BILITOT 0.75 12/27/2015 0805      STUDIES: Mm Diag Breast Tomo Bilateral  Result Date: 12/06/2015 CLINICAL DATA:  Right lumpectomy.  Annual mammography. EXAM: 2D DIGITAL DIAGNOSTIC BILATERAL MAMMOGRAM WITH CAD AND ADJUNCT TOMO COMPARISON:  Previous exam(s). ACR Breast Density Category b: There are scattered  areas of fibroglandular density. FINDINGS: No suspicious masses, calcifications, or distortion. Stable right lumpectomy. Mammographic images were processed with CAD. IMPRESSION: No mammographic evidence of malignancy RECOMMENDATION: Annual diagnostic mammography I have discussed the findings and recommendations with the patient. Results  were also provided in writing at the conclusion of the visit. If applicable, a reminder letter will be sent to the patient regarding the next appointment. BI-RADS CATEGORY  2: Benign. Electronically Signed   By: Dorise Bullion III M.D   On: 12/06/2015 14:42  2  ASSESSMENT: 60 y.o. BRCA1 and BRCA2 negative [place] woman   (1)  status post right breast upper outer quadrant biopsy 02/28/2013 for a clinical T2 N0, stage IIA invasive ductal carcinoma, grade not stated, triple negative, with an MIB-1 of 99%.  (2) treated in the neoadjuvant setting,   (a) completed 4 dose dense cycles of doxorubicin/ cyclophosphamide 05/19/2013. Cycle 4 was delayed one week because of bad weather  (b) received 7 of 12 planned weekly doses of carboplatin/ paclitaxel, with paclitaxel given alone with the 8th cycle, after which chemotherapy was stopped because of cytopenias and neuropathy; final dose 08/04/2013.  (3) status post right lumpectomy and sentinel lymph node sampling 09/01/2013 showing a complete pathologic response.  (4) adjuvant radiation therapy completed 12/21/2013.  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 Diona Fanti  (7) thyroiditis: followed by Dr. Wilson Singer  PLAN: [name] is now a little over 2 years out from definitive surgery for breast cancer. This is very favorable, especially since estrogen receptor negative tumors, if you're going to recur, then to recur.  She is doing that she can with a social issues that she is facing. She is not interested in proceeding  counseling or group support although that was discussed today.  From a breast cancer point of view at this point I am comfortable seeing her yearly. She will have lab work the same day and mammography  She knows to call for any problems that may develop her return here.    Chauncey Cruel, MD    12/27/2015

## 2016-02-14 DIAGNOSIS — E0789 Other specified disorders of thyroid: Secondary | ICD-10-CM | POA: Diagnosis not present

## 2016-02-21 DIAGNOSIS — E032 Hypothyroidism due to medicaments and other exogenous substances: Secondary | ICD-10-CM | POA: Diagnosis not present

## 2016-02-21 DIAGNOSIS — R5383 Other fatigue: Secondary | ICD-10-CM | POA: Diagnosis not present

## 2016-02-21 DIAGNOSIS — F419 Anxiety disorder, unspecified: Secondary | ICD-10-CM | POA: Diagnosis not present

## 2016-03-11 ENCOUNTER — Other Ambulatory Visit: Payer: Self-pay | Admitting: Nurse Practitioner

## 2016-03-17 ENCOUNTER — Other Ambulatory Visit: Payer: Self-pay | Admitting: Nurse Practitioner

## 2016-05-21 DIAGNOSIS — M549 Dorsalgia, unspecified: Secondary | ICD-10-CM | POA: Diagnosis not present

## 2016-05-21 DIAGNOSIS — E789 Disorder of lipoprotein metabolism, unspecified: Secondary | ICD-10-CM | POA: Diagnosis not present

## 2016-05-27 ENCOUNTER — Telehealth: Payer: Self-pay | Admitting: *Deleted

## 2016-05-27 NOTE — Telephone Encounter (Signed)
"  I've lost my information.  When is my next appointment there with Dr. Jana Hakim?"   Next scheduled F/U is December 25, 2016 at 8:30 am.

## 2016-08-28 DIAGNOSIS — E032 Hypothyroidism due to medicaments and other exogenous substances: Secondary | ICD-10-CM | POA: Diagnosis not present

## 2016-09-02 DIAGNOSIS — E032 Hypothyroidism due to medicaments and other exogenous substances: Secondary | ICD-10-CM | POA: Diagnosis not present

## 2016-09-23 DIAGNOSIS — L821 Other seborrheic keratosis: Secondary | ICD-10-CM | POA: Diagnosis not present

## 2016-09-23 DIAGNOSIS — D225 Melanocytic nevi of trunk: Secondary | ICD-10-CM | POA: Diagnosis not present

## 2016-12-18 ENCOUNTER — Other Ambulatory Visit: Payer: Self-pay | Admitting: Oncology

## 2016-12-18 DIAGNOSIS — Z853 Personal history of malignant neoplasm of breast: Secondary | ICD-10-CM

## 2016-12-24 ENCOUNTER — Ambulatory Visit
Admission: RE | Admit: 2016-12-24 | Discharge: 2016-12-24 | Disposition: A | Discharge status: Home / Self Care | Payer: BLUE CROSS/BLUE SHIELD | Source: Ambulatory Visit | Attending: Oncology | Admitting: Oncology

## 2016-12-24 ENCOUNTER — Other Ambulatory Visit: Payer: Self-pay | Admitting: *Deleted

## 2016-12-24 DIAGNOSIS — R928 Other abnormal and inconclusive findings on diagnostic imaging of breast: Secondary | ICD-10-CM | POA: Diagnosis not present

## 2016-12-24 DIAGNOSIS — Z853 Personal history of malignant neoplasm of breast: Secondary | ICD-10-CM

## 2016-12-24 DIAGNOSIS — Z171 Estrogen receptor negative status [ER-]: Secondary | ICD-10-CM

## 2016-12-24 DIAGNOSIS — C50411 Malignant neoplasm of upper-outer quadrant of right female breast: Secondary | ICD-10-CM

## 2016-12-24 HISTORY — DX: Personal history of antineoplastic chemotherapy: Z92.21

## 2016-12-24 HISTORY — DX: Personal history of irradiation: Z92.3

## 2016-12-24 NOTE — Progress Notes (Signed)
[place]  Telephone:(336) [phone] Fax:(336) [phone]    ID: CHI WOODHAM OB: 1955-09-11  MR#: [id number]  PIR#:[id number]  PCP: Anda Kraft, MD GYN:   SU: Rolm Bookbinder, MD OTHER MD: Ulyess Blossom, MD;  Franchot Gallo, MD, Leigh Aurora M.D.  CHIEF COMPLAINT:  Triple negative Right breast cancer  CURRENT TREATMENT: Observation  BREAST CANCER HISTORY: As per previously documented note:  Sydney Ford (pronounced "line 'em") herself noted a change in her right breast sometime October 2014 but "my breasts are like bean bags" and she initially ignored it. After while she began to feel a pole when she lay on her right side at night so she scheduled herself for screening mammography at the breast Center 02/08/2013 (prior mammogram had been April 2011), and this did show a possible mass in the right breast. Unilateral right diagnostic mammography and ultrasonography 02/28/2013 confirmed an irregular mass in the right breast upper outer quadrant measuring 2.4 cm. This was palpable at the 11:00 position. Ultrasound showed an irregular hypoechoic mass measuring 1.5 cm with no abnormal adenopathy in the right axilla.  Biopsy of the mass was performed the same day, and showed ([id number]) an invasive ductal carcinoma, grade not stated, E-cadherin strongly positive, estrogen and progesterone receptor negative with no HER-2 amplification, and an MIB-1 of 99%.  The patient met with Dr. Donne Hazel 03/14/2013 and he recommended primary systemic chemotherapy to decrease the size of the tumor and increase the chance of lumpectomy.   Subsequent history is as detailed below.  INTERVAL HISTORY: Sydney Ford returns today for follow-up of her estrogen receptor positive breast cancer. From a breast cancer point of view she is doing just fine. She just had mammography yesterday which shows not dense breasts and no evidence of malignancy.  REVIEW OF SYSTEMS: Sydney Ford reports tinnitus to her  bilateral ears that first started about 16 months ago, after finishing chemotherapy. She had seen an HEENT doctor in [place], but was unable to come to a resolution. She iscurrently trying the [place] Tinnitus Protocol along with her other daily medications. She notes taking about 3 tramadol a day with some relief. Other than her ears, pt reports she is falling apart. She denies unusual headaches, visual changes, nausea, vomiting, or dizziness. There has been no unusual cough, phlegm production, or pleurisy. This been no change in bowel or bladder habits. She denies unexplained fatigue or unexplained weight loss, bleeding, rash, or fever. A detailed review of systems was otherwise entirely negative.    PAST MEDICAL HISTORY: Past Medical History:  Diagnosis Date  . Anemia    2005 - related to heavy menses with the thyroid probl  . Arthritis    oa; lower back pain - hx of injury yrs ago-occas flare ups  . Breast cancer ([place]) 02/28/13   right, 11 o'clock  . Cancer [facility])    new dx of right breast cancer - planning chemo first  . Chronic kidney disease    kidney stone  . Cold (disease) 03/21/12   couple of weeks ago - pt states much better now  . Depression   . GERD (gastroesophageal reflux disease)    no meds except occas tums  . Headache(784.0)    occas migraine  . History of kidney stones    "passed 9 stones"  . Hx of radiation therapy 11/16/13- 12/21/13   right breast 5000 cGy in 25 sessions  . Hyperlipidemia   . Hypertension   . Hypothyroidism    hashimoto's  . Osteoporosis   .  Personal history of chemotherapy   . Personal history of radiation therapy   . Pneumonia    2014  . Thyroid disease     PAST SURGICAL HISTORY:  (Updated 07/07/2013) Past Surgical History:  Procedure Laterality Date  . AXILLARY SENTINEL NODE BIOPSY Right 09/01/2013   Procedure: RIGHT AXILLARY SENTINEL NODE BIOPSY;  Surgeon: Rolm Bookbinder, MD;  Location: [place];  Service: General;   Laterality: Right;  PECTORAL BLOCK WITH ANESTHESIA   . BREAST LUMPECTOMY Right 2015  . BREAST LUMPECTOMY WITH RADIOACTIVE SEED LOCALIZATION Right 09/01/2013   Procedure: BREAST LUMPECTOMY WITH RADIOACTIVE SEED LOCALIZATION;  Surgeon: Rolm Bookbinder, MD;  Location: [place];  Service: General;  Laterality: Right;  . CESAREAN SECTION  1987  . CYSTOSCOPY WITH RETROGRADE PYELOGRAM, URETEROSCOPY AND STENT PLACEMENT Right 06/30/2013   Procedure: CYSTOSCOPY WITH RETROGRADE PYELOGRAM, URETEROSCOPY, STONE EXTRACTION AND  STENT PLACEMENT;  Surgeon: Franchot Gallo, MD;  [facility];  Service: Urology;  Laterality: Right;  . HOLMIUM LASER APPLICATION Right 6/65/9935   Procedure: HOLMIUM LASER APPLICATION;  Surgeon: Franchot Gallo, MD;  [facility];  Service: Urology;  Laterality: Right;  . PORT-A-CATH REMOVAL Left 09/01/2013   Procedure: REMOVAL PORT-A-CATH;  Surgeon: Rolm Bookbinder, MD;  Location: [place];  Service: General;  Laterality: Left;  . PORTACATH PLACEMENT N/A 03/23/2013   Procedure: INSERTION PORT-A-CATH;  Surgeon: Rolm Bookbinder, MD;  [facility];  Service: General;  Laterality: N/A;  . TONSILLECTOMY    . uterine ablation  2008   no menstrual periods since procedure    FAMILY HISTORY Family History  Problem Relation Age of Onset  . Heart failure Mother   . Heart attack Father   . Stroke Paternal Uncle   . Heart failure Maternal Grandmother   . Heart attack Maternal Grandfather   . Throat cancer Paternal Grandmother        dx late 61s; non smoker or drinker  . Heart attack Paternal Grandfather   . Schizophrenia Son 29  . Heart attack Paternal Uncle   . Autoimmune disease Maternal Aunt        lupus, scelraderma, reynolds disease, and others   the patient's father died at the age of 33 from a myocardial infarction. The patient's mother died at the age of 65 with congestive heart failure. She also had a history of lupus. The  patient had no brothers or sisters. There is no history of breast or ovarian cancer in the family to her knowledge.  GYNECOLOGIC HISTORY:   (reviewed on 08/25/2013)  Menarche age 68, first live birth age 71. She is GX P2. She underwent endometrial ablation in 2009 and has not had a period since that time. She never took hormone replacement. She did use birth control pills remotely with no complications.  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 She does not work for a company. Her husband [name] is semiretired. He works for a company that [employer] data on [place]. There is a complex history that I am not detailing here. Son [name] is in [place] and works in Architect. Son [name] lives at home and is disabled with a diagnosis of paranoid schizophrenia. The patient has no grandchildren. She is not a Ambulance person.    ADVANCED DIRECTIVES: Not in place   HEALTH MAINTENANCE: ( reviewed 08/25/2013 ) Social History  Substance Use Topics  . Smoking status: Former Smoker  Quit date: 03/17/1985  . Smokeless tobacco: Never Used  . Alcohol use No     Colonoscopy: Never  PAP: Remote  Bone density: November 2014, showed osteoporosis     Lipid panel:  Not on file, Dr. Wilson Singer   Allergies  Allergen Reactions  . Codeine Itching and Nausea Only    REACTION: Nausea and itching  . Compazine [Prochlorperazine Edisylate] Other (See Comments)    Head aches   . Other     Close contact with Some recycled plastics cause whelps  . Tape Rash    Needs OPSITE dressing with port access    Current Outpatient Prescriptions  Medication Sig Dispense Refill  . atorvastatin (LIPITOR) 20 MG tablet Take 20 mg by mouth every evening.     Marland Kitchen buPROPion (WELLBUTRIN XL) 150 MG 24 hr tablet Take 150 mg by mouth daily.  3  . gabapentin (NEURONTIN) 300 MG capsule TAKE 3 CAPSULES BY MOUTH AT BEDTIME 90 capsule 8  . omeprazole  (PRILOSEC) 40 MG capsule Take 40 mg by mouth daily.   3  . thyroid (ARMOUR) 90 MG tablet Take 90 mg by mouth every morning.    . traMADol (ULTRAM) 50 MG tablet Take 1-2 tablets (50-100 mg total) by mouth every 6 (six) hours as needed. 120 tablet 3   No current facility-administered medications for this visit.        Vitals:   12/25/16 0829  BP: (!) 155/82  Pulse: 70  Resp: 17  Temp: 98.3 F (36.8 C)  SpO2: 98%     Body mass index is 40.15 kg/m.    ECOG FS: 1 Filed Weights   12/25/16 0829  Weight: 219 lb 8 oz (99.6 kg)   Objective: Morbidly obese white woman in no acute distress  Sclerae unicteric, EOMs intact Oropharynx clear and moist No cervical or supraclavicular adenopathy Lungs no rales or rhonchi Heart regular rate and rhythm Abd soft, nontender, positive bowel sounds MSK no focal spinal tenderness, no upper extremity lymphedema Neuro: nonfocal, well oriented, appropriate affect Breasts: The right breast is undergone lumpectomy followed by radiation with no evidence of local recurrence. The left breast is benign. Both axillae are benign.   LAB RESULTS:  Lab Results  Component Value Date   WBC 4.7 12/25/2016   NEUTROABS 3.2 12/25/2016   HGB 14.0 12/25/2016   HCT 41.6 12/25/2016   MCV 90.8 12/25/2016   PLT 179 12/25/2016      Chemistry      Component Value Date/Time   NA 144 12/25/2016 0809   K 4.5 12/25/2016 0809   CL 105 08/31/2013 1340   CO2 25 12/25/2016 0809   BUN 18.9 12/25/2016 0809   CREATININE 0.9 12/25/2016 0809      Component Value Date/Time   CALCIUM 9.3 12/25/2016 0809   ALKPHOS 79 12/25/2016 0809   AST 17 12/25/2016 0809   ALT 22 12/25/2016 0809   BILITOT 0.42 12/25/2016 0809      STUDIES: Bilateral mammography with tomography at the [facility] 12/24/2016 found the breast density to be category B. There was no evidence of malignancy.  ASSESSMENT: 61 y.o. BRCA1 and BRCA2 negative [place] woman   (1)  status post right  breast upper outer quadrant biopsy 02/28/2013 for a clinical T2 N0, stage IIA invasive ductal carcinoma, grade not stated, triple negative, with an MIB-1 of 99%.  (2) treated in the neoadjuvant setting,   (a) completed 4 dose dense cycles of doxorubicin/ cyclophosphamide 05/19/2013. Cycle 4 was delayed  one week because of bad weather  (b) received 7 of 12 planned weekly doses of carboplatin/ paclitaxel, with paclitaxel given alone with the 8th cycle, after which chemotherapy was stopped because of cytopenias and neuropathy; final dose 08/04/2013.  (3) status post right lumpectomy and sentinel lymph node sampling 09/01/2013 showing a complete pathologic response.  (4) adjuvant radiation therapy completed 12/21/2013.  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 Diona Fanti  (7) thyroiditis: followed by Dr. Wilson Singer  PLAN: Sydney Ford is now a little over 3 years out from definitive surgery for her breast cancer with no evidence of disease recurrence. This is very favorable.  We discussed her tinnitus at length. I have suggested she stopped the meloxicam and aspirin. She is going to try tramadol for pain control. If that doesn't work after 2-3 months she can return to those medications  As far as her multiple arthritis problems, she is unwilling to go into a swimming pool which I think would be the very best. I give her information on our tai chi program and [facility]  She will return to see me in one year. That likely will be her "graduation" visit.  She knows to call for any problems that may develop before the next visit here.    Magrinat, Virgie Dad, MD  12/25/16 9:16 AM Medical Oncology and Hematology [facility] [address] [place], [place] [address] Tel. [phone]    Fax. [phone]  This document serves as a record of services  personally performed by Chauncey Cruel, MD. It was created on her behalf by Margit Banda, a trained medical scribe. The creation of this record is based on the scribe's personal observations and the provider's statements to them. This document has been checked and approved by the attending provider.

## 2016-12-25 ENCOUNTER — Telehealth: Payer: Self-pay | Admitting: Oncology

## 2016-12-25 ENCOUNTER — Ambulatory Visit (HOSPITAL_BASED_OUTPATIENT_CLINIC_OR_DEPARTMENT_OTHER): Payer: BLUE CROSS/BLUE SHIELD | Admitting: Oncology

## 2016-12-25 ENCOUNTER — Other Ambulatory Visit (HOSPITAL_BASED_OUTPATIENT_CLINIC_OR_DEPARTMENT_OTHER): Payer: BLUE CROSS/BLUE SHIELD

## 2016-12-25 VITALS — BP 155/82 | HR 70 | Temp 98.3°F | Resp 17 | Ht 62.0 in | Wt 219.5 lb

## 2016-12-25 DIAGNOSIS — C50411 Malignant neoplasm of upper-outer quadrant of right female breast: Secondary | ICD-10-CM

## 2016-12-25 DIAGNOSIS — Z171 Estrogen receptor negative status [ER-]: Secondary | ICD-10-CM | POA: Diagnosis not present

## 2016-12-25 DIAGNOSIS — Z170 Estrogen receptor positive status [ER+]: Secondary | ICD-10-CM

## 2016-12-25 LAB — CBC WITH DIFFERENTIAL/PLATELET
BASO%: 0.4 % (ref 0.0–2.0)
BASOS ABS: 0 10*3/uL (ref 0.0–0.1)
EOS ABS: 0.2 10*3/uL (ref 0.0–0.5)
EOS%: 4 % (ref 0.0–7.0)
HEMATOCRIT: 41.6 % (ref 34.8–46.6)
HEMOGLOBIN: 14 g/dL (ref 11.6–15.9)
LYMPH#: 1.1 10*3/uL (ref 0.9–3.3)
LYMPH%: 22.7 % (ref 14.0–49.7)
MCH: 30.6 pg (ref 25.1–34.0)
MCHC: 33.7 g/dL (ref 31.5–36.0)
MCV: 90.8 fL (ref 79.5–101.0)
MONO#: 0.3 10*3/uL (ref 0.1–0.9)
MONO%: 5.3 % (ref 0.0–14.0)
NEUT#: 3.2 10*3/uL (ref 1.5–6.5)
NEUT%: 67.6 % (ref 38.4–76.8)
PLATELETS: 179 10*3/uL (ref 145–400)
RBC: 4.58 10*6/uL (ref 3.70–5.45)
RDW: 12.8 % (ref 11.2–14.5)
WBC: 4.7 10*3/uL (ref 3.9–10.3)

## 2016-12-25 LAB — COMPREHENSIVE METABOLIC PANEL
ALBUMIN: 3.7 g/dL (ref 3.5–5.0)
ALK PHOS: 79 U/L (ref 40–150)
ALT: 22 U/L (ref 0–55)
ANION GAP: 11 meq/L (ref 3–11)
AST: 17 U/L (ref 5–34)
BUN: 18.9 mg/dL (ref 7.0–26.0)
CALCIUM: 9.3 mg/dL (ref 8.4–10.4)
CO2: 25 mEq/L (ref 22–29)
Chloride: 108 mEq/L (ref 98–109)
Creatinine: 0.9 mg/dL (ref 0.6–1.1)
Glucose: 98 mg/dl (ref 70–140)
POTASSIUM: 4.5 meq/L (ref 3.5–5.1)
Sodium: 144 mEq/L (ref 136–145)
Total Bilirubin: 0.42 mg/dL (ref 0.20–1.20)
Total Protein: 6.7 g/dL (ref 6.4–8.3)

## 2016-12-25 MED ORDER — TRAMADOL HCL 50 MG PO TABS: 50.0000 mg | ORAL_TABLET | Freq: Four times a day (QID) | ORAL | 3 refills | Status: AC | PRN

## 2016-12-25 NOTE — Telephone Encounter (Signed)
Gave patient avs and calendar with appts per 10/11 los °

## 2017-01-05 DIAGNOSIS — M170 Bilateral primary osteoarthritis of knee: Secondary | ICD-10-CM | POA: Diagnosis not present

## 2017-01-05 DIAGNOSIS — M7582 Other shoulder lesions, left shoulder: Secondary | ICD-10-CM | POA: Diagnosis not present

## 2017-01-05 DIAGNOSIS — M25561 Pain in right knee: Secondary | ICD-10-CM | POA: Diagnosis not present

## 2017-01-05 DIAGNOSIS — M1711 Unilateral primary osteoarthritis, right knee: Secondary | ICD-10-CM | POA: Diagnosis not present

## 2017-01-06 ENCOUNTER — Telehealth: Payer: Self-pay | Admitting: *Deleted

## 2017-01-06 NOTE — Telephone Encounter (Signed)
This RN returned call to pt per her VM stating if she could use Volteran Gel for joint discomfort.  Note - per last visit and issue of tinnitus - recommended for pt to hold aspirin and meloxicam.  " would the gel be ok to try or could it be absorbed into my blood "  Return call number given as [phone].  Per call - obtained VM- message left requesting return call to discuss above further- including current status of the tinnitus.  This RN's name given for contact.

## 2017-02-25 DIAGNOSIS — E032 Hypothyroidism due to medicaments and other exogenous substances: Secondary | ICD-10-CM | POA: Diagnosis not present

## 2017-03-03 DIAGNOSIS — E039 Hypothyroidism, unspecified: Secondary | ICD-10-CM | POA: Diagnosis not present

## 2017-03-03 DIAGNOSIS — M170 Bilateral primary osteoarthritis of knee: Secondary | ICD-10-CM | POA: Diagnosis not present

## 2017-03-30 DIAGNOSIS — E0789 Other specified disorders of thyroid: Secondary | ICD-10-CM | POA: Diagnosis not present

## 2017-03-30 DIAGNOSIS — E039 Hypothyroidism, unspecified: Secondary | ICD-10-CM | POA: Diagnosis not present

## 2017-04-07 DIAGNOSIS — M7582 Other shoulder lesions, left shoulder: Secondary | ICD-10-CM | POA: Diagnosis not present

## 2017-04-07 DIAGNOSIS — M25562 Pain in left knee: Secondary | ICD-10-CM | POA: Diagnosis not present

## 2017-04-07 DIAGNOSIS — M25512 Pain in left shoulder: Secondary | ICD-10-CM | POA: Diagnosis not present

## 2017-04-07 DIAGNOSIS — M170 Bilateral primary osteoarthritis of knee: Secondary | ICD-10-CM | POA: Diagnosis not present

## 2017-07-14 DIAGNOSIS — M25561 Pain in right knee: Secondary | ICD-10-CM | POA: Diagnosis not present

## 2017-07-14 DIAGNOSIS — M7582 Other shoulder lesions, left shoulder: Secondary | ICD-10-CM | POA: Diagnosis not present

## 2017-07-14 DIAGNOSIS — M170 Bilateral primary osteoarthritis of knee: Secondary | ICD-10-CM | POA: Diagnosis not present

## 2017-07-14 DIAGNOSIS — M25512 Pain in left shoulder: Secondary | ICD-10-CM | POA: Diagnosis not present

## 2017-07-23 DIAGNOSIS — E0789 Other specified disorders of thyroid: Secondary | ICD-10-CM | POA: Diagnosis not present

## 2017-07-23 DIAGNOSIS — Z5181 Encounter for therapeutic drug level monitoring: Secondary | ICD-10-CM | POA: Diagnosis not present

## 2017-07-23 DIAGNOSIS — E039 Hypothyroidism, unspecified: Secondary | ICD-10-CM | POA: Diagnosis not present

## 2017-07-23 DIAGNOSIS — M25561 Pain in right knee: Secondary | ICD-10-CM | POA: Diagnosis not present

## 2017-08-11 ENCOUNTER — Encounter: Payer: Self-pay | Admitting: Emergency Medicine

## 2017-08-11 DIAGNOSIS — S1093XA Contusion of unspecified part of neck, initial encounter: Secondary | ICD-10-CM | POA: Diagnosis not present

## 2017-08-11 DIAGNOSIS — S40022A Contusion of left upper arm, initial encounter: Secondary | ICD-10-CM | POA: Insufficient documentation

## 2017-08-11 DIAGNOSIS — Z853 Personal history of malignant neoplasm of breast: Secondary | ICD-10-CM | POA: Diagnosis not present

## 2017-08-11 DIAGNOSIS — Y9389 Activity, other specified: Secondary | ICD-10-CM | POA: Insufficient documentation

## 2017-08-11 DIAGNOSIS — S0990XA Unspecified injury of head, initial encounter: Secondary | ICD-10-CM | POA: Diagnosis not present

## 2017-08-11 DIAGNOSIS — S0181XA Laceration without foreign body of other part of head, initial encounter: Secondary | ICD-10-CM | POA: Diagnosis not present

## 2017-08-11 DIAGNOSIS — S40021A Contusion of right upper arm, initial encounter: Secondary | ICD-10-CM | POA: Insufficient documentation

## 2017-08-11 DIAGNOSIS — Y998 Other external cause status: Secondary | ICD-10-CM | POA: Diagnosis not present

## 2017-08-11 DIAGNOSIS — N189 Chronic kidney disease, unspecified: Secondary | ICD-10-CM | POA: Diagnosis not present

## 2017-08-11 DIAGNOSIS — F329 Major depressive disorder, single episode, unspecified: Secondary | ICD-10-CM | POA: Diagnosis not present

## 2017-08-11 DIAGNOSIS — R0902 Hypoxemia: Secondary | ICD-10-CM | POA: Diagnosis not present

## 2017-08-11 DIAGNOSIS — Z79899 Other long term (current) drug therapy: Secondary | ICD-10-CM | POA: Insufficient documentation

## 2017-08-11 DIAGNOSIS — Y929 Unspecified place or not applicable: Secondary | ICD-10-CM | POA: Diagnosis not present

## 2017-08-11 DIAGNOSIS — Z87891 Personal history of nicotine dependence: Secondary | ICD-10-CM | POA: Insufficient documentation

## 2017-08-11 DIAGNOSIS — E039 Hypothyroidism, unspecified: Secondary | ICD-10-CM | POA: Insufficient documentation

## 2017-08-11 DIAGNOSIS — S199XXA Unspecified injury of neck, initial encounter: Secondary | ICD-10-CM | POA: Diagnosis not present

## 2017-08-11 DIAGNOSIS — R58 Hemorrhage, not elsewhere classified: Secondary | ICD-10-CM | POA: Diagnosis not present

## 2017-08-11 DIAGNOSIS — I129 Hypertensive chronic kidney disease with stage 1 through stage 4 chronic kidney disease, or unspecified chronic kidney disease: Secondary | ICD-10-CM | POA: Diagnosis not present
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
## 2017-08-12 ENCOUNTER — Emergency Department: Payer: BLUE CROSS/BLUE SHIELD

## 2017-08-12 ENCOUNTER — Emergency Department
Admission: EM | Admit: 2017-08-12 | Discharge: 2017-08-12 | Disposition: A | Discharge status: Home / Self Care | Payer: BLUE CROSS/BLUE SHIELD | Attending: Emergency Medicine | Admitting: Emergency Medicine

## 2017-08-12 DIAGNOSIS — S40029A Contusion of unspecified upper arm, initial encounter: Secondary | ICD-10-CM

## 2017-08-12 DIAGNOSIS — S199XXA Unspecified injury of neck, initial encounter: Secondary | ICD-10-CM | POA: Diagnosis not present

## 2017-08-12 DIAGNOSIS — S0990XA Unspecified injury of head, initial encounter: Secondary | ICD-10-CM | POA: Diagnosis not present

## 2017-08-12 DIAGNOSIS — S0181XA Laceration without foreign body of other part of head, initial encounter: Secondary | ICD-10-CM | POA: Diagnosis not present

## 2017-08-12 MED ORDER — LIDOCAINE HCL (PF) 1 % IJ SOLN
INTRAMUSCULAR | Status: AC
  Filled 2017-08-12: qty 5

## 2017-08-12 MED ORDER — OXYCODONE-ACETAMINOPHEN 5-325 MG PO TABS
1.0000 | ORAL_TABLET | ORAL | Status: DC | PRN
  Administered 2017-08-12: 1 via ORAL
  Filled 2017-08-12: qty 1

## 2017-08-12 MED ORDER — TRAMADOL HCL 50 MG PO TABS
100.0000 mg | ORAL_TABLET | Freq: Once | ORAL | Status: AC
  Administered 2017-08-12: 100 mg via ORAL
  Filled 2017-08-12: qty 2

## 2017-08-12 MED ORDER — LIDOCAINE HCL (PF) 1 % IJ SOLN: 5.0000 mL | Freq: Once | INTRAMUSCULAR | Status: DC
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
## 2017-08-12 NOTE — ED Notes (Signed)
Patient updated on wait time 
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
## 2017-09-29 DIAGNOSIS — E039 Hypothyroidism, unspecified: Secondary | ICD-10-CM | POA: Diagnosis not present

## 2017-09-29 DIAGNOSIS — Z5181 Encounter for therapeutic drug level monitoring: Secondary | ICD-10-CM | POA: Diagnosis not present

## 2017-09-29 DIAGNOSIS — M25561 Pain in right knee: Secondary | ICD-10-CM | POA: Diagnosis not present

## 2017-09-29 DIAGNOSIS — E7800 Pure hypercholesterolemia, unspecified: Secondary | ICD-10-CM | POA: Diagnosis not present

## 2017-10-06 DIAGNOSIS — E039 Hypothyroidism, unspecified: Secondary | ICD-10-CM | POA: Diagnosis not present

## 2017-10-06 DIAGNOSIS — E559 Vitamin D deficiency, unspecified: Secondary | ICD-10-CM | POA: Diagnosis not present

## 2017-10-06 DIAGNOSIS — Z0000 Encounter for general adult medical examination without abnormal findings: Secondary | ICD-10-CM | POA: Diagnosis not present

## 2017-12-04 ENCOUNTER — Other Ambulatory Visit: Payer: Self-pay | Admitting: Oncology

## 2017-12-04 DIAGNOSIS — Z9889 Other specified postprocedural states: Secondary | ICD-10-CM

## 2017-12-25 ENCOUNTER — Ambulatory Visit
Admission: RE | Admit: 2017-12-25 | Discharge: 2017-12-25 | Disposition: A | Discharge status: Home / Self Care | Payer: BLUE CROSS/BLUE SHIELD | Source: Ambulatory Visit | Attending: Oncology | Admitting: Oncology

## 2017-12-25 DIAGNOSIS — R928 Other abnormal and inconclusive findings on diagnostic imaging of breast: Secondary | ICD-10-CM | POA: Diagnosis not present

## 2017-12-25 DIAGNOSIS — Z9889 Other specified postprocedural states: Secondary | ICD-10-CM

## 2017-12-29 NOTE — Progress Notes (Signed)
[place]  Telephone:([phone]) [phone] Fax:(336) [phone]    ID: [name] OB: Mar 10, 1956  MR#: [id number]  LXB#:[id number]  PCP: Sydney Kraft, MD GYN:   SU: Sydney Bookbinder, MD OTHER MD: Sydney Blossom, MD;  Sydney Gallo, MD, Sydney Ford M.D.  CHIEF COMPLAINT:  Triple negative Right breast cancer  CURRENT TREATMENT: Observation  BREAST CANCER HISTORY: As per previously documented note:  Sydney Ford (pronounced "line 'em") herself noted a change in her right breast sometime October 2014 but "my breasts are like bean bags" and she initially ignored it. After while she began to feel a pole when she lay on her right side at night so she scheduled herself for [facility] (prior mammogram had been April 2011), and this did show a possible mass in the right breast. Unilateral right diagnostic mammography and ultrasonography 02/28/2013 confirmed an irregular mass in the right breast upper outer quadrant measuring 2.4 cm. This was palpable at the 11:00 position. Ultrasound showed an irregular hypoechoic mass measuring 1.5 cm with no abnormal adenopathy in the right axilla.  Biopsy of the mass was performed the same day, and showed ([id number]) an invasive ductal carcinoma, grade not stated, E-cadherin strongly positive, estrogen and progesterone receptor negative with no HER-2 amplification, and an MIB-1 of 99%.  The patient met with Dr. Donne Ford 03/14/2013 and he recommended primary systemic chemotherapy to decrease the size of the tumor and increase the chance of lumpectomy.   Subsequent history is as detailed below.  INTERVAL HISTORY: Sydney Ford returns today for follow-up of her estrogen receptor positive breast cancer. She continues under observation. About 10 days ago she had a knot and soreness in the left axilla, but this has resolved. She denies any other changes in the breasts.  Also on 12/20/2017 she woke up with pain  and swelling behind her left ear.  This is slightly better but has not completely resolved.  Finally she also "saw something" outside at [facility] on Sunday.  It looks like a greenlight next to the car and then it bubbled away she says.  She says she has no clue what that was.  Since her last visit, she underwent diagnostic bilateral mammography with CAD and tomography on 12/25/2017 at [place] showing: breast density category B. There was no evidence of malignancy.    REVIEW OF SYSTEMS: Sydney Ford reports numbness in her fingers and hands and a little at the tips of her toes. She was taking gabapentin at night, but she discontinued this because she felt a little woozy getting up during the night. She has an area of redness on her right cheek. She had a slight headache with pain and swelling behind the left ear. She dneies issues with hearing or chewing. The pain behind her ear is less sore. She has issues with her knees, and she is planning on having bilateral knee replacements, but she has to wait until she is 83 in May 2020 to afford the costs. She denies visual changes, nausea, vomiting, or dizziness. There has been no unusual cough, phlegm production, or pleurisy. There has been no change in bowel or bladder habits. She denies unexplained fatigue or unexplained weight loss, bleeding, rash, or fever. A detailed review of systems was otherwise stable.    PAST MEDICAL HISTORY: Past Medical History:  Diagnosis Date  . Anemia    2005 - related to heavy menses with the thyroid probl  . Arthritis    oa; lower back  pain - hx of injury yrs ago-occas flare ups  . Breast cancer ([place]) 02/28/13   right, 11 o'clock  . Cancer [facility])    new dx of right breast cancer - planning chemo first  . Chronic kidney disease    kidney stone  . Cold (disease) 03/21/12   couple of weeks ago - pt states much better now  . Depression   . GERD (gastroesophageal reflux disease)    no meds except occas tums  .  Headache(784.0)    occas migraine  . History of kidney stones    "passed 9 stones"  . Hx of radiation therapy 11/16/13- 12/21/13   right breast 5000 cGy in 25 sessions  . Hyperlipidemia   . Hypertension   . Hypothyroidism    hashimoto's  . Osteoporosis   . Personal history of chemotherapy   . Personal history of radiation therapy   . Pneumonia    2014  . Thyroid disease     PAST SURGICAL HISTORY:  (Updated 07/07/2013) Past Surgical History:  Procedure Laterality Date  . AXILLARY SENTINEL NODE BIOPSY Right 09/01/2013   Procedure: RIGHT AXILLARY SENTINEL NODE BIOPSY;  Surgeon: Sydney Bookbinder, MD;  Location: [place];  Service: General;  Laterality: Right;  PECTORAL BLOCK WITH ANESTHESIA   . BREAST LUMPECTOMY Right 2015  . BREAST LUMPECTOMY WITH RADIOACTIVE SEED LOCALIZATION Right 09/01/2013   Procedure: BREAST LUMPECTOMY WITH RADIOACTIVE SEED LOCALIZATION;  Surgeon: Sydney Bookbinder, MD;  Location: [place];  Service: General;  Laterality: Right;  . CESAREAN SECTION  1987  . CYSTOSCOPY WITH RETROGRADE PYELOGRAM, URETEROSCOPY AND STENT PLACEMENT Right 06/30/2013   Procedure: CYSTOSCOPY WITH RETROGRADE PYELOGRAM, URETEROSCOPY, STONE EXTRACTION AND  STENT PLACEMENT;  Surgeon: Sydney Gallo, MD;  Location: [facility];  Service: Urology;  Laterality: Right;  . HOLMIUM LASER APPLICATION Right 06/05/2246   Procedure: HOLMIUM LASER APPLICATION;  Surgeon: Sydney Gallo, MD;  Location: [facility];  Service: Urology;  Laterality: Right;  . PORT-A-CATH REMOVAL Left 09/01/2013   Procedure: REMOVAL PORT-A-CATH;  Surgeon: Sydney Bookbinder, MD;  Location: [place];  Service: General;  Laterality: Left;  . PORTACATH PLACEMENT N/A 03/23/2013   Procedure: INSERTION PORT-A-CATH;  Surgeon: Sydney Bookbinder, MD;  Location: [facility];  Service: General;  Laterality: N/A;  . TONSILLECTOMY    . uterine ablation  2008   no menstrual periods since procedure     FAMILY HISTORY Family History  Problem Relation Age of Onset  . Heart failure Mother   . Heart attack Father   . Stroke Paternal Uncle   . Heart failure Maternal Grandmother   . Heart attack Maternal Grandfather   . Throat cancer Paternal Grandmother        dx late 27s; non smoker or drinker  . Heart attack Paternal Grandfather   . Schizophrenia Son 62  . Heart attack Paternal Uncle   . Autoimmune disease Maternal Aunt        lupus, scelraderma, reynolds disease, and others   the patient's father died at the age of 75 from a myocardial infarction. The patient's mother died at the age of 32 with congestive heart failure. She also had a history of lupus. The patient had no brothers or sisters. There is no history of breast or ovarian cancer in the family to her knowledge.  GYNECOLOGIC HISTORY:   (reviewed on 08/25/2013)  Menarche age 45, first live birth age 62. She is GX P2. She underwent endometrial ablation in 2009 and has  not had a period since that time. She never took hormone replacement. She did use birth control pills remotely with no complications.  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 She does not work for a company.  She is currently separated from her husband [name]. He works for a company that [employer] data on [place]. There is a complex history that I am not detailing here. Son [name] is in [place] and works in Architect. Son [name] lives at home and is disabled with a diagnosis of paranoid schizophrenia. The patient has no grandchildren. She is not a Ambulance person.    ADVANCED DIRECTIVES: Not in place; at the 12/30/2017 visit the patient was given the appropriate documents to complete and notarized MI: She plans to remove her husband as healthcare power of attorney   HEALTH MAINTENANCE: ( reviewed 08/25/2013 ) Social History   Tobacco Use  . Smoking status: Former Smoker     Last attempt to quit: 03/17/1985    Years since quitting: 32.8  . Smokeless tobacco: Never Used  Substance Use Topics  . Alcohol use: No  . Drug use: No     Colonoscopy: Never  PAP: Remote  Bone density: November 2014, showed osteoporosis     Lipid panel:  Not on file, Dr. Wilson Singer   Allergies  Allergen Reactions  . Codeine Itching and Nausea Only    REACTION: Nausea and itching  . Compazine [Prochlorperazine Edisylate] Other (See Comments)    Head aches   . Other     Close contact with Some recycled plastics cause whelps  . Tape Rash    Needs OPSITE dressing with port access    Current Outpatient Medications  Medication Sig Dispense Refill  . atorvastatin (LIPITOR) 20 MG tablet Take 20 mg by mouth every evening.     Marland Kitchen buPROPion (WELLBUTRIN XL) 150 MG 24 hr tablet Take 150 mg by mouth daily.  3  . gabapentin (NEURONTIN) 100 MG capsule Take 1 capsule (100 mg total) by mouth at bedtime. 90 capsule 4  . omeprazole (PRILOSEC) 40 MG capsule Take 40 mg by mouth daily.   3  . thyroid (ARMOUR) 90 MG tablet Take 90 mg by mouth every morning.    . traMADol (ULTRAM) 50 MG tablet Take 1-2 tablets (50-100 mg total) by mouth every 6 (six) hours as needed. 120 tablet 3   No current facility-administered medications for this visit.     Objective: Morbidly obese white woman in no acute distress  Vitals:   12/30/17 0900  BP: (!) 138/94  Pulse: 65  Resp: 18  Temp: 98.1 F (36.7 C)  SpO2: 96%     Body mass index is 38.65 kg/m.    ECOG FS: 1 Filed Weights   12/30/17 0900  Weight: 211 lb 4.8 oz (95.8 kg)   Sclerae unicteric, pupils round and equal Oropharynx clear and moist The area behind the left ear is slightly swollen, not erythematous, minimally tender No cervical or supraclavicular adenopathy Lungs no rales or rhonchi Heart regular rate and rhythm Abd soft, nontender, positive bowel sounds MSK no focal spinal tenderness, no right upper extremity lymphedema Neuro: nonfocal,  well oriented, appropriate affect Breasts: The right breast is status post lumpectomy and radiation.  There is no evidence of local recurrence.  Left breast is benign.  Both axillae are benign.  LAB RESULTS:  Lab Results  Component Value Date   WBC 4.5 12/30/2017  NEUTROABS 3.1 12/30/2017   HGB 13.7 12/30/2017   HCT 40.1 12/30/2017   MCV 89.1 12/30/2017   PLT 175 12/30/2017      Chemistry      Component Value Date/Time   NA 144 12/25/2016 0809   K 4.5 12/25/2016 0809   CL 105 08/31/2013 1340   CO2 25 12/25/2016 0809   BUN 18.9 12/25/2016 0809   CREATININE 0.9 12/25/2016 0809      Component Value Date/Time   CALCIUM 9.3 12/25/2016 0809   ALKPHOS 79 12/25/2016 0809   AST 17 12/25/2016 0809   ALT 22 12/25/2016 0809   BILITOT 0.42 12/25/2016 0809      STUDIES: Mm Diag Breast Tomo Bilateral  Result Date: 12/25/2017 CLINICAL DATA:  62 year old female presenting for routine annual surveillance status post right breast lumpectomy in 2015. EXAM: DIGITAL DIAGNOSTIC BILATERAL MAMMOGRAM WITH CAD AND TOMO COMPARISON:  Previous exam(s). ACR Breast Density Category b: There are scattered areas of fibroglandular density. FINDINGS: Benign dystrophic calcifications have developed at the lumpectomy site in the upper-outer quadrant of the right breast, which is otherwise stable. No suspicious changes are identified at the lumpectomy site. No suspicious calcifications, masses or areas of distortion are seen in the bilateral breasts. Mammographic images were processed with CAD. IMPRESSION: Stable right breast lumpectomy site. No mammographic evidence of malignancy in the bilateral breasts. RECOMMENDATION: Diagnostic mammogram is suggested in 1 year. (Code:DM-B-01Y) I have discussed the findings and recommendations with the patient. Results were also provided in writing at the conclusion of the visit. If applicable, a reminder letter will be sent to the patient regarding the next appointment. BI-RADS  CATEGORY  2: Benign. Electronically Signed   By: Ammie Ferrier M.D.   On: 12/25/2017 15:48     ASSESSMENT: 62 y.o. BRCA1 and BRCA2 negative [place] woman   (1)  status post right breast upper outer quadrant biopsy 02/28/2013 for a clinical T2 N0, stage IIA invasive ductal carcinoma, grade not stated, triple negative, with an MIB-1 of 99%.  (2) treated in the neoadjuvant setting,   (a) completed 4 dose dense cycles of doxorubicin/ cyclophosphamide 05/19/2013. Cycle 4 was delayed one week because of bad weather  (b) received 7 of 12 planned weekly doses of carboplatin/ paclitaxel, with paclitaxel given alone with the 8th cycle, after which chemotherapy was stopped because of cytopenias and neuropathy; final dose 08/04/2013.  (3) status post right lumpectomy and sentinel lymph node sampling 09/01/2013 showing a complete pathologic response.  (4) adjuvant radiation therapy completed 12/21/2013.  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 Diona Fanti  (7) thyroiditis: followed by Dr. Wilson Singer  PLAN: Sydney Ford is a little over 4 years out from definitive surgery for breast cancer with no evidence of disease recurrence.  This is very favorable.  She is still having some neuropathy from her chemo.  This is mostly in the pads of the fingers and toes.  She has been off gabapentin about a year but thinks she would like to go back on it.  We are going to drop the dose to 100 mg and she will only take it at bedtime.  She will let me know if that is not sufficient.  Given the home situation which is complex and not discussed here in detail, she needs to complete a new healthcare power of attorney.  I gave her the appropriate forms to complete and notarized today.  I think the greenlight she saw  last weekend is very likely a retinal problem and I have referred her to an ophthalmologist for further  evaluation  She needs knee replacements.  She is going to try to make an appointment with Dr. Wynelle Link May 2028 which is when she will enroll in Medicare  She will see me again one more time a year from now.  She knows to call for any other issues that may develop before that visit.    Jamille Fisher, Virgie Dad, MD  12/30/17 9:32 AM Medical Oncology and Hematology [facility] [address] [place], [place] [address] Tel. [phone]    Fax. [phone]  Alice Rieger, am acting as scribe for Chauncey Cruel MD.  I, Lurline Del MD, have reviewed the above documentation for accuracy and completeness, and I agree with the above.

## 2017-12-30 ENCOUNTER — Telehealth: Payer: Self-pay | Admitting: Oncology

## 2017-12-30 ENCOUNTER — Other Ambulatory Visit: Payer: Self-pay | Admitting: *Deleted

## 2017-12-30 ENCOUNTER — Inpatient Hospital Stay: Payer: BLUE CROSS/BLUE SHIELD | Attending: Oncology | Admitting: Oncology

## 2017-12-30 ENCOUNTER — Inpatient Hospital Stay: Payer: BLUE CROSS/BLUE SHIELD

## 2017-12-30 ENCOUNTER — Other Ambulatory Visit: Payer: Self-pay | Admitting: Oncology

## 2017-12-30 VITALS — BP 138/94 | HR 65 | Temp 98.1°F | Resp 18 | Ht 62.0 in | Wt 211.3 lb

## 2017-12-30 DIAGNOSIS — Z923 Personal history of irradiation: Secondary | ICD-10-CM | POA: Insufficient documentation

## 2017-12-30 DIAGNOSIS — R51 Headache: Secondary | ICD-10-CM | POA: Insufficient documentation

## 2017-12-30 DIAGNOSIS — N189 Chronic kidney disease, unspecified: Secondary | ICD-10-CM | POA: Insufficient documentation

## 2017-12-30 DIAGNOSIS — Z87442 Personal history of urinary calculi: Secondary | ICD-10-CM | POA: Insufficient documentation

## 2017-12-30 DIAGNOSIS — Z9221 Personal history of antineoplastic chemotherapy: Secondary | ICD-10-CM | POA: Insufficient documentation

## 2017-12-30 DIAGNOSIS — E1122 Type 2 diabetes mellitus with diabetic chronic kidney disease: Secondary | ICD-10-CM | POA: Insufficient documentation

## 2017-12-30 DIAGNOSIS — M810 Age-related osteoporosis without current pathological fracture: Secondary | ICD-10-CM | POA: Diagnosis not present

## 2017-12-30 DIAGNOSIS — K219 Gastro-esophageal reflux disease without esophagitis: Secondary | ICD-10-CM | POA: Diagnosis not present

## 2017-12-30 DIAGNOSIS — I129 Hypertensive chronic kidney disease with stage 1 through stage 4 chronic kidney disease, or unspecified chronic kidney disease: Secondary | ICD-10-CM | POA: Diagnosis not present

## 2017-12-30 DIAGNOSIS — C50411 Malignant neoplasm of upper-outer quadrant of right female breast: Secondary | ICD-10-CM

## 2017-12-30 DIAGNOSIS — G620 Drug-induced polyneuropathy: Secondary | ICD-10-CM | POA: Insufficient documentation

## 2017-12-30 DIAGNOSIS — Z171 Estrogen receptor negative status [ER-]: Secondary | ICD-10-CM | POA: Insufficient documentation

## 2017-12-30 DIAGNOSIS — Z853 Personal history of malignant neoplasm of breast: Secondary | ICD-10-CM | POA: Diagnosis not present

## 2017-12-30 DIAGNOSIS — R220 Localized swelling, mass and lump, head: Secondary | ICD-10-CM | POA: Diagnosis not present

## 2017-12-30 DIAGNOSIS — T451X5A Adverse effect of antineoplastic and immunosuppressive drugs, initial encounter: Secondary | ICD-10-CM

## 2017-12-30 DIAGNOSIS — E785 Hyperlipidemia, unspecified: Secondary | ICD-10-CM

## 2017-12-30 DIAGNOSIS — M1990 Unspecified osteoarthritis, unspecified site: Secondary | ICD-10-CM | POA: Diagnosis not present

## 2017-12-30 DIAGNOSIS — Z87891 Personal history of nicotine dependence: Secondary | ICD-10-CM | POA: Insufficient documentation

## 2017-12-30 DIAGNOSIS — Z79899 Other long term (current) drug therapy: Secondary | ICD-10-CM | POA: Insufficient documentation

## 2017-12-30 DIAGNOSIS — T451X5S Adverse effect of antineoplastic and immunosuppressive drugs, sequela: Secondary | ICD-10-CM | POA: Insufficient documentation

## 2017-12-30 LAB — CBC WITH DIFFERENTIAL/PLATELET
ABS IMMATURE GRANULOCYTES: 0.01 10*3/uL (ref 0.00–0.07)
BASOS ABS: 0 10*3/uL (ref 0.0–0.1)
BASOS PCT: 1 %
Eosinophils Absolute: 0.2 10*3/uL (ref 0.0–0.5)
Eosinophils Relative: 4 %
HCT: 40.1 % (ref 36.0–46.0)
HEMOGLOBIN: 13.7 g/dL (ref 12.0–15.0)
IMMATURE GRANULOCYTES: 0 %
LYMPHS PCT: 18 %
Lymphs Abs: 0.8 10*3/uL (ref 0.7–4.0)
MCH: 30.4 pg (ref 26.0–34.0)
MCHC: 34.2 g/dL (ref 30.0–36.0)
MCV: 89.1 fL (ref 80.0–100.0)
MONO ABS: 0.4 10*3/uL (ref 0.1–1.0)
Monocytes Relative: 10 %
NEUTROS ABS: 3.1 10*3/uL (ref 1.7–7.7)
NEUTROS PCT: 67 %
PLATELETS: 175 10*3/uL (ref 150–400)
RBC: 4.5 MIL/uL (ref 3.87–5.11)
RDW: 12.6 % (ref 11.5–15.5)
WBC: 4.5 10*3/uL (ref 4.0–10.5)
nRBC: 0 % (ref 0.0–0.2)

## 2017-12-30 MED ORDER — GABAPENTIN 100 MG PO CAPS: 100.0000 mg | ORAL_CAPSULE | Freq: Every day | ORAL | 4 refills | Status: DC

## 2017-12-30 NOTE — Telephone Encounter (Signed)
Gave avs and calendar ° °

## 2018-01-13 DIAGNOSIS — M159 Polyosteoarthritis, unspecified: Secondary | ICD-10-CM | POA: Diagnosis not present

## 2018-01-13 DIAGNOSIS — M79643 Pain in unspecified hand: Secondary | ICD-10-CM | POA: Diagnosis not present

## 2018-01-13 DIAGNOSIS — M79641 Pain in right hand: Secondary | ICD-10-CM | POA: Diagnosis not present

## 2018-01-13 DIAGNOSIS — M25569 Pain in unspecified knee: Secondary | ICD-10-CM | POA: Diagnosis not present

## 2018-01-13 DIAGNOSIS — M79642 Pain in left hand: Secondary | ICD-10-CM | POA: Diagnosis not present

## 2018-01-13 DIAGNOSIS — M810 Age-related osteoporosis without current pathological fracture: Secondary | ICD-10-CM | POA: Diagnosis not present

## 2018-01-28 DIAGNOSIS — M25512 Pain in left shoulder: Secondary | ICD-10-CM | POA: Diagnosis not present

## 2018-01-28 DIAGNOSIS — M25569 Pain in unspecified knee: Secondary | ICD-10-CM | POA: Diagnosis not present

## 2018-01-28 DIAGNOSIS — M25562 Pain in left knee: Secondary | ICD-10-CM | POA: Diagnosis not present

## 2018-01-28 DIAGNOSIS — M79643 Pain in unspecified hand: Secondary | ICD-10-CM | POA: Diagnosis not present

## 2018-01-28 DIAGNOSIS — M170 Bilateral primary osteoarthritis of knee: Secondary | ICD-10-CM | POA: Diagnosis not present

## 2018-01-28 DIAGNOSIS — M159 Polyosteoarthritis, unspecified: Secondary | ICD-10-CM | POA: Diagnosis not present

## 2018-02-03 ENCOUNTER — Other Ambulatory Visit: Payer: Self-pay | Admitting: *Deleted

## 2018-02-03 MED ORDER — GABAPENTIN 100 MG PO CAPS: 100.0000 mg | ORAL_CAPSULE | Freq: Every day | ORAL | 4 refills | Status: DC

## 2018-02-10 ENCOUNTER — Other Ambulatory Visit: Payer: Self-pay

## 2018-02-10 MED ORDER — GABAPENTIN 100 MG PO CAPS: 100.0000 mg | ORAL_CAPSULE | Freq: Every day | ORAL | 0 refills | Status: DC

## 2018-02-17 DIAGNOSIS — M810 Age-related osteoporosis without current pathological fracture: Secondary | ICD-10-CM | POA: Diagnosis not present

## 2018-02-22 ENCOUNTER — Other Ambulatory Visit: Payer: Self-pay

## 2018-02-22 DIAGNOSIS — Z171 Estrogen receptor negative status [ER-]: Secondary | ICD-10-CM

## 2018-02-22 DIAGNOSIS — C50411 Malignant neoplasm of upper-outer quadrant of right female breast: Secondary | ICD-10-CM

## 2018-02-22 NOTE — Progress Notes (Signed)
Pt called reporting palpable mass in left breast discovered about a week ago.  Pt feels like has decreased in size since then.  Diagnostic bilat mammo in Oct. [place] with [place] has been ordered. Pt will call [facility] to schedule.

## 2018-02-25 ENCOUNTER — Ambulatory Visit
Admission: RE | Admit: 2018-02-25 | Discharge: 2018-02-25 | Disposition: A | Discharge status: Home / Self Care | Payer: No Typology Code available for payment source | Source: Ambulatory Visit | Attending: Oncology | Admitting: Oncology

## 2018-02-25 DIAGNOSIS — Z171 Estrogen receptor negative status [ER-]: Secondary | ICD-10-CM

## 2018-02-25 DIAGNOSIS — C50411 Malignant neoplasm of upper-outer quadrant of right female breast: Secondary | ICD-10-CM

## 2018-02-25 DIAGNOSIS — N6322 Unspecified lump in the left breast, upper inner quadrant: Secondary | ICD-10-CM | POA: Diagnosis not present

## 2018-02-25 DIAGNOSIS — N6324 Unspecified lump in the left breast, lower inner quadrant: Secondary | ICD-10-CM | POA: Diagnosis not present

## 2018-02-25 DIAGNOSIS — R928 Other abnormal and inconclusive findings on diagnostic imaging of breast: Secondary | ICD-10-CM | POA: Diagnosis not present

## 2018-03-03 DIAGNOSIS — E039 Hypothyroidism, unspecified: Secondary | ICD-10-CM | POA: Diagnosis not present

## 2018-03-03 DIAGNOSIS — I10 Essential (primary) hypertension: Secondary | ICD-10-CM | POA: Diagnosis not present

## 2018-03-23 DIAGNOSIS — E7800 Pure hypercholesterolemia, unspecified: Secondary | ICD-10-CM | POA: Diagnosis not present

## 2018-03-23 DIAGNOSIS — E039 Hypothyroidism, unspecified: Secondary | ICD-10-CM | POA: Diagnosis not present

## 2018-03-23 DIAGNOSIS — E559 Vitamin D deficiency, unspecified: Secondary | ICD-10-CM | POA: Diagnosis not present

## 2018-03-30 DIAGNOSIS — I10 Essential (primary) hypertension: Secondary | ICD-10-CM | POA: Diagnosis not present

## 2018-03-30 DIAGNOSIS — M159 Polyosteoarthritis, unspecified: Secondary | ICD-10-CM | POA: Diagnosis not present

## 2018-04-15 DIAGNOSIS — M79643 Pain in unspecified hand: Secondary | ICD-10-CM | POA: Diagnosis not present

## 2018-04-15 DIAGNOSIS — M170 Bilateral primary osteoarthritis of knee: Secondary | ICD-10-CM | POA: Diagnosis not present

## 2018-04-15 DIAGNOSIS — M79644 Pain in right finger(s): Secondary | ICD-10-CM | POA: Diagnosis not present

## 2018-04-15 DIAGNOSIS — M6530 Trigger finger, unspecified finger: Secondary | ICD-10-CM | POA: Diagnosis not present

## 2018-04-15 DIAGNOSIS — M2560 Stiffness of unspecified joint, not elsewhere classified: Secondary | ICD-10-CM | POA: Diagnosis not present

## 2018-04-15 DIAGNOSIS — M159 Polyosteoarthritis, unspecified: Secondary | ICD-10-CM | POA: Diagnosis not present

## 2018-08-26 DIAGNOSIS — M810 Age-related osteoporosis without current pathological fracture: Secondary | ICD-10-CM | POA: Diagnosis not present

## 2018-10-27 DIAGNOSIS — M79643 Pain in unspecified hand: Secondary | ICD-10-CM | POA: Diagnosis not present

## 2018-10-27 DIAGNOSIS — M1712 Unilateral primary osteoarthritis, left knee: Secondary | ICD-10-CM | POA: Diagnosis not present

## 2018-10-27 DIAGNOSIS — M810 Age-related osteoporosis without current pathological fracture: Secondary | ICD-10-CM | POA: Diagnosis not present

## 2018-10-27 DIAGNOSIS — M170 Bilateral primary osteoarthritis of knee: Secondary | ICD-10-CM | POA: Diagnosis not present

## 2018-10-27 DIAGNOSIS — M25569 Pain in unspecified knee: Secondary | ICD-10-CM | POA: Diagnosis not present

## 2018-11-10 DIAGNOSIS — I10 Essential (primary) hypertension: Secondary | ICD-10-CM | POA: Diagnosis not present

## 2018-11-10 DIAGNOSIS — Z0000 Encounter for general adult medical examination without abnormal findings: Secondary | ICD-10-CM | POA: Diagnosis not present

## 2018-11-10 DIAGNOSIS — Z79899 Other long term (current) drug therapy: Secondary | ICD-10-CM | POA: Diagnosis not present

## 2018-11-10 DIAGNOSIS — M25561 Pain in right knee: Secondary | ICD-10-CM | POA: Diagnosis not present

## 2018-11-10 DIAGNOSIS — M810 Age-related osteoporosis without current pathological fracture: Secondary | ICD-10-CM | POA: Diagnosis not present

## 2018-11-19 DIAGNOSIS — Z23 Encounter for immunization: Secondary | ICD-10-CM | POA: Diagnosis not present

## 2018-11-19 DIAGNOSIS — Z0000 Encounter for general adult medical examination without abnormal findings: Secondary | ICD-10-CM | POA: Diagnosis not present

## 2018-11-19 DIAGNOSIS — E559 Vitamin D deficiency, unspecified: Secondary | ICD-10-CM | POA: Diagnosis not present

## 2018-11-19 DIAGNOSIS — M25561 Pain in right knee: Secondary | ICD-10-CM | POA: Diagnosis not present

## 2018-11-19 DIAGNOSIS — I10 Essential (primary) hypertension: Secondary | ICD-10-CM | POA: Diagnosis not present

## 2018-11-19 DIAGNOSIS — E039 Hypothyroidism, unspecified: Secondary | ICD-10-CM | POA: Diagnosis not present

## 2018-12-02 DIAGNOSIS — M65341 Trigger finger, right ring finger: Secondary | ICD-10-CM | POA: Diagnosis not present

## 2018-12-02 DIAGNOSIS — M79641 Pain in right hand: Secondary | ICD-10-CM | POA: Diagnosis not present

## 2018-12-02 DIAGNOSIS — M65331 Trigger finger, right middle finger: Secondary | ICD-10-CM | POA: Diagnosis not present

## 2018-12-02 DIAGNOSIS — G5601 Carpal tunnel syndrome, right upper limb: Secondary | ICD-10-CM | POA: Diagnosis not present

## 2018-12-09 ENCOUNTER — Other Ambulatory Visit: Payer: Self-pay | Admitting: Oncology

## 2018-12-09 DIAGNOSIS — Z9889 Other specified postprocedural states: Secondary | ICD-10-CM

## 2018-12-13 ENCOUNTER — Other Ambulatory Visit: Payer: Self-pay | Admitting: Oncology

## 2018-12-15 ENCOUNTER — Telehealth: Payer: Self-pay | Admitting: Oncology

## 2018-12-15 NOTE — Telephone Encounter (Signed)
[facility] CME moved appointments from 10/20 to 11/4 w/LC. Confirmed with patient.

## 2018-12-20 DIAGNOSIS — I10 Essential (primary) hypertension: Secondary | ICD-10-CM | POA: Diagnosis not present

## 2018-12-20 DIAGNOSIS — E559 Vitamin D deficiency, unspecified: Secondary | ICD-10-CM | POA: Diagnosis not present

## 2018-12-20 DIAGNOSIS — E7800 Pure hypercholesterolemia, unspecified: Secondary | ICD-10-CM | POA: Diagnosis not present

## 2018-12-20 DIAGNOSIS — E039 Hypothyroidism, unspecified: Secondary | ICD-10-CM | POA: Diagnosis not present

## 2019-01-03 ENCOUNTER — Ambulatory Visit
Admission: RE | Admit: 2019-01-03 | Discharge: 2019-01-03 | Disposition: A | Discharge status: Home / Self Care | Payer: Medicare Other | Source: Ambulatory Visit | Attending: Oncology | Admitting: Oncology

## 2019-01-03 ENCOUNTER — Other Ambulatory Visit: Payer: Self-pay

## 2019-01-03 DIAGNOSIS — Z853 Personal history of malignant neoplasm of breast: Secondary | ICD-10-CM | POA: Diagnosis not present

## 2019-01-03 DIAGNOSIS — Z9889 Other specified postprocedural states: Secondary | ICD-10-CM

## 2019-01-03 DIAGNOSIS — R928 Other abnormal and inconclusive findings on diagnostic imaging of breast: Secondary | ICD-10-CM | POA: Diagnosis not present

## 2019-01-04 ENCOUNTER — Other Ambulatory Visit: Payer: No Typology Code available for payment source

## 2019-01-04 ENCOUNTER — Ambulatory Visit: Payer: No Typology Code available for payment source | Admitting: Oncology

## 2019-01-07 DIAGNOSIS — M24541 Contracture, right hand: Secondary | ICD-10-CM | POA: Diagnosis not present

## 2019-01-07 DIAGNOSIS — G5601 Carpal tunnel syndrome, right upper limb: Secondary | ICD-10-CM | POA: Diagnosis not present

## 2019-01-07 DIAGNOSIS — M65341 Trigger finger, right ring finger: Secondary | ICD-10-CM | POA: Diagnosis not present

## 2019-01-07 DIAGNOSIS — M65331 Trigger finger, right middle finger: Secondary | ICD-10-CM | POA: Diagnosis not present

## 2019-01-19 ENCOUNTER — Other Ambulatory Visit: Payer: Self-pay

## 2019-01-19 ENCOUNTER — Inpatient Hospital Stay: Payer: Medicare Other | Attending: Oncology | Admitting: Adult Health

## 2019-01-19 ENCOUNTER — Encounter: Payer: Self-pay | Admitting: Adult Health

## 2019-01-19 ENCOUNTER — Inpatient Hospital Stay: Payer: Medicare Other

## 2019-01-19 VITALS — BP 167/90 | HR 75 | Temp 99.1°F | Resp 18 | Wt 220.2 lb

## 2019-01-19 DIAGNOSIS — Z87891 Personal history of nicotine dependence: Secondary | ICD-10-CM | POA: Diagnosis not present

## 2019-01-19 DIAGNOSIS — Z9221 Personal history of antineoplastic chemotherapy: Secondary | ICD-10-CM | POA: Diagnosis not present

## 2019-01-19 DIAGNOSIS — Z923 Personal history of irradiation: Secondary | ICD-10-CM | POA: Diagnosis not present

## 2019-01-19 DIAGNOSIS — Z853 Personal history of malignant neoplasm of breast: Secondary | ICD-10-CM | POA: Diagnosis not present

## 2019-01-19 DIAGNOSIS — T451X5A Adverse effect of antineoplastic and immunosuppressive drugs, initial encounter: Secondary | ICD-10-CM | POA: Insufficient documentation

## 2019-01-19 DIAGNOSIS — Z79899 Other long term (current) drug therapy: Secondary | ICD-10-CM | POA: Diagnosis not present

## 2019-01-19 DIAGNOSIS — Z171 Estrogen receptor negative status [ER-]: Secondary | ICD-10-CM | POA: Insufficient documentation

## 2019-01-19 DIAGNOSIS — G620 Drug-induced polyneuropathy: Secondary | ICD-10-CM

## 2019-01-19 DIAGNOSIS — N189 Chronic kidney disease, unspecified: Secondary | ICD-10-CM | POA: Insufficient documentation

## 2019-01-19 DIAGNOSIS — C50411 Malignant neoplasm of upper-outer quadrant of right female breast: Secondary | ICD-10-CM

## 2019-01-19 DIAGNOSIS — Z87442 Personal history of urinary calculi: Secondary | ICD-10-CM | POA: Diagnosis not present

## 2019-01-19 DIAGNOSIS — K219 Gastro-esophageal reflux disease without esophagitis: Secondary | ICD-10-CM | POA: Insufficient documentation

## 2019-01-19 DIAGNOSIS — E069 Thyroiditis, unspecified: Secondary | ICD-10-CM | POA: Diagnosis not present

## 2019-01-19 DIAGNOSIS — I129 Hypertensive chronic kidney disease with stage 1 through stage 4 chronic kidney disease, or unspecified chronic kidney disease: Secondary | ICD-10-CM | POA: Insufficient documentation

## 2019-01-19 DIAGNOSIS — E785 Hyperlipidemia, unspecified: Secondary | ICD-10-CM | POA: Diagnosis not present

## 2019-01-19 LAB — COMPREHENSIVE METABOLIC PANEL
ALT: 16 U/L (ref 0–44)
AST: 13 U/L — ABNORMAL LOW (ref 15–41)
Albumin: 3.8 g/dL (ref 3.5–5.0)
Alkaline Phosphatase: 64 U/L (ref 38–126)
Anion gap: 11 (ref 5–15)
BUN: 16 mg/dL (ref 8–23)
CO2: 26 mmol/L (ref 22–32)
Calcium: 9.8 mg/dL (ref 8.9–10.3)
Chloride: 105 mmol/L (ref 98–111)
Creatinine, Ser: 1.18 mg/dL — ABNORMAL HIGH (ref 0.44–1.00)
GFR calc Af Amer: 56 mL/min — ABNORMAL LOW (ref >60)
GFR calc non Af Amer: 48 mL/min — ABNORMAL LOW (ref >60)
Glucose, Bld: 94 mg/dL (ref 70–99)
Potassium: 4.3 mmol/L (ref 3.5–5.1)
Sodium: 142 mmol/L (ref 135–145)
Total Bilirubin: 0.6 mg/dL (ref 0.3–1.2)
Total Protein: 6.8 g/dL (ref 6.5–8.1)

## 2019-01-19 LAB — CBC WITH DIFFERENTIAL/PLATELET
Abs Immature Granulocytes: 0.02 10*3/uL (ref 0.00–0.07)
Basophils Absolute: 0 10*3/uL (ref 0.0–0.1)
Basophils Relative: 1 %
Eosinophils Absolute: 0.2 10*3/uL (ref 0.0–0.5)
Eosinophils Relative: 4 %
HCT: 39.3 % (ref 36.0–46.0)
Hemoglobin: 13.6 g/dL (ref 12.0–15.0)
Immature Granulocytes: 0 %
Lymphocytes Relative: 19 %
Lymphs Abs: 1.1 10*3/uL (ref 0.7–4.0)
MCH: 30.6 pg (ref 26.0–34.0)
MCHC: 34.6 g/dL (ref 30.0–36.0)
MCV: 88.3 fL (ref 80.0–100.0)
Monocytes Absolute: 0.5 10*3/uL (ref 0.1–1.0)
Monocytes Relative: 8 %
Neutro Abs: 4 10*3/uL (ref 1.7–7.7)
Neutrophils Relative %: 68 %
Platelets: 212 10*3/uL (ref 150–400)
RBC: 4.45 MIL/uL (ref 3.87–5.11)
RDW: 12 % (ref 11.5–15.5)
WBC: 5.8 10*3/uL (ref 4.0–10.5)
nRBC: 0 % (ref 0.0–0.2)

## 2019-01-19 NOTE — Progress Notes (Addendum)
[place]  [phone]) [phone] [phone]) [phone]    ID: Sydney Ford OB: 02/10/1956  MR#: [id number]  KJZ#:[id number]  PCP: Jani Gravel, MD GYN:   SU: Rolm Bookbinder, MD OTHER MD: Ulyess Blossom, MD;  Franchot Gallo, MD, Leigh Aurora M.D.  CHIEF COMPLAINT:  Triple negative Right breast cancer  CURRENT TREATMENT: Observation  BREAST CANCER HISTORY: As per previously documented note:  Sydney Ford (pronounced "line 'em") herself noted a change in her right breast sometime October 2014 but "my breasts are like bean bags" and she initially ignored it. After while she began to feel a pole when she lay on her right side at night so she scheduled herself for screening mammography at the [facility] 02/08/2013 (prior mammogram had been April 2011), and this did show a possible mass in the right breast. Unilateral right diagnostic mammography and ultrasonography 02/28/2013 confirmed an irregular mass in the right breast upper outer quadrant measuring 2.4 cm. This was palpable at the 11:00 position. Ultrasound showed an irregular hypoechoic mass measuring 1.5 cm with no abnormal adenopathy in the right axilla.  Biopsy of the mass was performed the same day, and showed ([id number]) an invasive ductal carcinoma, grade not stated, E-cadherin strongly positive, estrogen and progesterone receptor negative with no HER-2 amplification, and an MIB-1 of 99%.  The patient met with Dr. Donne Hazel 03/14/2013 and he recommended primary systemic chemotherapy to decrease the size of the tumor and increase the chance of lumpectomy.   Subsequent history is as detailed below.  INTERVAL HISTORY: Sydney Ford is here today for follow up of her h/o triple negative cancer.  She is doing moderately well today.    Since her last visit she underwent bilateral diagnostic mammogram with CAD and TOMO on 01/03/2019 that showed no evidence of malignancy and breast density category B.   Sydney Ford has  recently undergone hand surgery about 10 days ago on her left hand.  She had this done for trigger finger and carpal tunnel.  She has no further numbness and tingling.  She notes her BP is elevated because she has been up very late watching the election results.  She had been restarted on Gabapentin for neuropathy.  She has since stopped.  She occasionally has neuropathy in her feet at night, and this doesn't happen often.  If it keeps her up she will take Tramadol to help with the pain.    REVIEW OF SYSTEMS: Sydney Ford is doing well.  She sees her PCP regularly.  She also has f/u appointments with endocrinology and rheumatology.  She notes she needs to have colon cancer screening.  She has regular skin checks with dermatology.  She has stopped undergoing pap smears.  She has not undergone hysterectomy.  She notes she does have osteoporosis and Dr. Kathlene November manages this with Prolia.    Sydney Ford has no cartilage in her knees and shoulders.  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  After she does this, she cannot exercise.  She is working on getting in a study at [facility] about her knees.  Sydney Ford is living with her two sons.  One is schizophrenic, and the other moved home to help with his brother, and since he was divorced.    PAST MEDICAL HISTORY: Past Medical History:  Diagnosis Date  . Anemia    2005 - related to heavy menses with the thyroid probl  . Arthritis    oa; lower back pain - hx of injury yrs  ago-occas flare ups  . Breast cancer ([place]) 02/28/13   right, 11 o'clock  . Cancer [facility])    new dx of right breast cancer - planning chemo first  . Chronic kidney disease    kidney stone  . Cold (disease) 03/21/12   couple of weeks ago - pt states much better now  . Depression   . GERD (gastroesophageal reflux disease)    no meds except occas tums  . Headache(784.0)    occas migraine  . History of kidney stones    "passed 9 stones"  . Hx of radiation therapy 11/16/13- 12/21/13   right breast  5000 cGy in 25 sessions  . Hyperlipidemia   . Hypertension   . Hypothyroidism    hashimoto's  . Osteoporosis   . Personal history of chemotherapy   . Personal history of radiation therapy   . Pneumonia    2014  . Thyroid disease     PAST SURGICAL HISTORY:  (Updated 07/07/2013) Past Surgical History:  Procedure Laterality Date  . AXILLARY SENTINEL NODE BIOPSY Right 09/01/2013   Procedure: RIGHT AXILLARY SENTINEL NODE BIOPSY;  Surgeon: Rolm Bookbinder, MD;  Location: [facility];  Service: General;  Laterality: Right;  PECTORAL BLOCK WITH ANESTHESIA   . BREAST LUMPECTOMY Right 2015  . BREAST LUMPECTOMY WITH RADIOACTIVE SEED LOCALIZATION Right 09/01/2013   Procedure: BREAST LUMPECTOMY WITH RADIOACTIVE SEED LOCALIZATION;  Surgeon: Rolm Bookbinder, MD;  Location: [facility];  Service: General;  Laterality: Right;  . CESAREAN SECTION  1987  . CYSTOSCOPY WITH RETROGRADE PYELOGRAM, URETEROSCOPY AND STENT PLACEMENT Right 06/30/2013   Procedure: CYSTOSCOPY WITH RETROGRADE PYELOGRAM, URETEROSCOPY, STONE EXTRACTION AND  STENT PLACEMENT;  Surgeon: Franchot Gallo, MD;  Location: [facility];  Service: Urology;  Laterality: Right;  . HOLMIUM LASER APPLICATION Right 9/62/9528   Procedure: HOLMIUM LASER APPLICATION;  Surgeon: Franchot Gallo, MD;  Location: [facility];  Service: Urology;  Laterality: Right;  . PORT-A-CATH REMOVAL Left 09/01/2013   Procedure: REMOVAL PORT-A-CATH;  Surgeon: Rolm Bookbinder, MD;  Location: [facility];  Service: General;  Laterality: Left;  . PORTACATH PLACEMENT N/A 03/23/2013   Procedure: INSERTION PORT-A-CATH;  Surgeon: Rolm Bookbinder, MD;  Location: [facility];  Service: General;  Laterality: N/A;  . TONSILLECTOMY    . uterine ablation  2008   no menstrual periods since procedure    FAMILY HISTORY Family History  Problem Relation Age of Onset  . Heart failure Mother   . Heart attack Father   . Stroke Paternal Uncle   .  Heart failure Maternal Grandmother   . Heart attack Maternal Grandfather   . Throat cancer Paternal Grandmother        dx late 51s; non smoker or drinker  . Heart attack Paternal Grandfather   . Schizophrenia Son 61  . Heart attack Paternal Uncle   . Autoimmune disease Maternal Aunt        lupus, scelraderma, reynolds disease, and others   the patient's father died at the age of 17 from a myocardial infarction. The patient's mother died at the age of 48 with congestive heart failure. She also had a history of lupus. The patient had no brothers or sisters. There is no history of breast or ovarian cancer in the family to her knowledge.  GYNECOLOGIC HISTORY:   (reviewed on 01/19/2019)  Menarche age 46, first live birth age 61. She is GX P2. She underwent endometrial ablation in 2009 and has not had a period since that  time. She never took hormone replacement. She did use birth control pills remotely with no complications.  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 She does not work for a company.  She is currently separated from her husband Sydney Ford.  She has not been divorced yet, and is working on this. He works for a company that [employer] data on [place]. There is a complex history that I am not detailing here. Son Sydney Ford is in [place] and recently moved home after going through divorce. Son Sydney Ford lives at home and is disabled with a diagnosis of paranoid schizophrenia. The patient has no grandchildren. She is not a Ambulance person.    ADVANCED DIRECTIVES: Not in place; at the 12/30/2017 visit the patient was given the appropriate documents to complete and notarized MI: She plans to remove her husband as healthcare power of attorney   HEALTH MAINTENANCE: (reviewed 01/19/2019) Social History   Tobacco Use  . Smoking status: Former Smoker    Quit date: 03/17/1985    Years since quitting: 33.8  . Smokeless  tobacco: Never Used  Substance Use Topics  . Alcohol use: No  . Drug use: No     Colonoscopy: Never  PAP: Remote  Bone density: f/u with Dr. Kathlene November   Lipid panel:  F/u with her PCP   Allergies  Allergen Reactions  . Codeine Itching and Nausea Only    REACTION: Nausea and itching  . Compazine [Prochlorperazine Edisylate] Other (See Comments)    Head aches   . Other     Close contact with Some recycled plastics cause whelps  . Tape Rash    Needs OPSITE dressing with port access    Current Outpatient Medications  Medication Sig Dispense Refill  . atorvastatin (LIPITOR) 20 MG tablet Take 20 mg by mouth every evening.     Marland Kitchen omeprazole (PRILOSEC) 40 MG capsule Take 40 mg by mouth daily.   3  . thyroid (ARMOUR) 90 MG tablet Take 90 mg by mouth every morning.    . traMADol (ULTRAM) 50 MG tablet Take 1-2 tablets (50-100 mg total) by mouth every 6 (six) hours as needed. 120 tablet 3   No current facility-administered medications for this visit.     Objective:   Vitals:   01/19/19 0828  BP: (!) 167/90  Pulse: 75  Resp: 18  Temp: 99.1 F (37.3 C)  SpO2: 98%     Body mass index is 40.28 kg/m.    ECOG FS: 1 Filed Weights   01/19/19 0828  Weight: 220 lb 3.2 oz (99.9 kg)   GENERAL: Patient is a well appearing female in no acute distress HEENT:  Sclerae anicteric.  Oropharynx clear and moist. No ulcerations or evidence of oropharyngeal candidiasis. Neck is supple.  NODES:  No cervical, supraclavicular, or axillary lymphadenopathy palpated.  BREAST EXAM:  Right breast s/p lumpectomy and radiation, no sign of local recurrence, left breast benign LUNGS:  Clear to auscultation bilaterally.  No wheezes or rhonchi. HEART:  Regular rate and rhythm. No murmur appreciated. ABDOMEN:  Soft, nontender.  Positive, normoactive bowel sounds. No organomegaly palpated. MSK:  No focal spinal tenderness to palpation. Full range of motion bilaterally in the upper extremities. EXTREMITIES:  No  peripheral edema.   SKIN:  Clear with no obvious rashes or skin changes. No nail dyscrasia. NEURO:  Nonfocal. Well oriented.  Appropriate affect.    LAB RESULTS:  Lab Results  Component  Value Date   WBC 5.8 01/19/2019   NEUTROABS 4.0 01/19/2019   HGB 13.6 01/19/2019   HCT 39.3 01/19/2019   MCV 88.3 01/19/2019   PLT 212 01/19/2019      Chemistry      Component Value Date/Time   NA 142 01/19/2019 0815   NA 144 12/25/2016 0809   K 4.3 01/19/2019 0815   K 4.5 12/25/2016 0809   CL 105 01/19/2019 0815   CO2 26 01/19/2019 0815   CO2 25 12/25/2016 0809   BUN 16 01/19/2019 0815   BUN 18.9 12/25/2016 0809   CREATININE 1.18 (H) 01/19/2019 0815   CREATININE 0.9 12/25/2016 0809      Component Value Date/Time   CALCIUM 9.8 01/19/2019 0815   CALCIUM 9.3 12/25/2016 0809   ALKPHOS 64 01/19/2019 0815   ALKPHOS 79 12/25/2016 0809   AST 13 (L) 01/19/2019 0815   AST 17 12/25/2016 0809   ALT 16 01/19/2019 0815   ALT 22 12/25/2016 0809   BILITOT 0.6 01/19/2019 0815   BILITOT 0.42 12/25/2016 0809      STUDIES: Mm Diag Breast Tomo Bilateral  Result Date: 01/03/2019 CLINICAL DATA:  History of right breast cancer status post lumpectomy in 2015. EXAM: DIGITAL DIAGNOSTIC BILATERAL MAMMOGRAM WITH CAD AND TOMO COMPARISON:  Previous exam(s). ACR Breast Density Category b: There are scattered areas of fibroglandular density. FINDINGS: Stable lumpectomy changes are seen in the right breast. No suspicious mass or malignant type microcalcifications identified in either breast. Mammographic images were processed with CAD. IMPRESSION: No evidence of malignancy in either breast. RECOMMENDATION: Bilateral screening mammogram in 1 year is recommended. I have discussed the findings and recommendations with the patient. If applicable, a reminder letter will be sent to the patient regarding the next appointment. BI-RADS CATEGORY  2: Benign. Electronically Signed   By: Lillia Mountain M.D.   On: 01/03/2019 15:58      ASSESSMENT: 63 y.o. BRCA1 and BRCA2 negative [place] woman   (1)  status post right breast upper outer quadrant biopsy 02/28/2013 for a clinical T2 N0, stage IIA invasive ductal carcinoma, grade not stated, triple negative, with an MIB-1 of 99%.  (2) treated in the neoadjuvant setting,   (a) completed 4 dose dense cycles of doxorubicin/ cyclophosphamide 05/19/2013. Cycle 4 was delayed one week because of bad weather  (b) received 7 of 12 planned weekly doses of carboplatin/ paclitaxel, with paclitaxel given alone with the 8th cycle, after which chemotherapy was stopped because of cytopenias and neuropathy; final dose 08/04/2013.  (3) status post right lumpectomy and sentinel lymph node sampling 09/01/2013 showing a complete pathologic response.  (4) adjuvant radiation therapy completed 12/21/2013.  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 Diona Fanti  (7) thyroiditis: followed by Dr. Wilson Singer  PLAN:  Sydney Ford is doing well today.  She has no clinical or radiographic signs of breast cancer recurrence.  She is more than 5 years out from her triple negative breast cancer diagnosis.  She and I reviewed that patients with a h/o triple negative breast cancer who go more than 5 years without recurrence, have their recurrence risk drop dramatically.  This is good news.    We reviewed her recommendations.  She will continue with annual mammograms.  This will be due again in 12/2019.  We reviewed the need for an annual clinical breast exam.  While she can graduate from her f/u with [place] today, she needs  to have this done with her PCP or GYN.  She notes she would like to participate in the LTS program and have the breast exam with [place], which we are happy to accommodate.    Sydney Ford and I reviewed recommendations for healthy diet and exercise.  She was recommended to continue to abstain  from ETOH and tobacco.  I also recommended she f/u with her PCP and consider colon cancer screening.  She understands this.  She is up to date with her other cancer screenings.    We will see Sydney Ford back in 1 year for continued LTS f/u.  She was recommended to continue with the appropriate pandemic precautions. She knows to call for any questions that may arise between now and her next appointment.  We are happy to see her sooner if needed.  She met with Dr. Jana Hakim today who also reviewed the above and gave her a graduation tassel.    Wilber Bihari, NP 01/19/19 9:06 AM Medical Oncology and Hematology [facility] [address] [place], [place] [address] Tel. [phone]    Fax. [phone]    ADDENDUM: Sydney Ford is now more than 5 years out from definitive surgery for her breast cancer with no evidence of disease recurrence this is very favorable.  Since she had a complete pathologic response to her neoadjuvant treatment her long-term prognosis is very good.  At this point I am comfortable releasing her from active care.  All she will need in terms of breast cancer follow-up is her yearly mammography and a yearly physician breast exam.  I have encouraged her to continue to see my nurse practitioner in our survivorship clinic.  I congratulated her on "graduating" today.  Of course I will be glad to see her again at any point in the future if and when the need arises.  However at this point we are not making any further routine appointments for her with me here.  I personally saw this patient and performed a substantive portion of this encounter with the listed APP documented above.   Chauncey Cruel, MD Medical Oncology and Hematology [facility] [address] [place], [place] [address] Tel. [phone]    Fax. [phone]

## 2019-01-20 ENCOUNTER — Telehealth: Payer: Self-pay | Admitting: Adult Health

## 2019-01-20 NOTE — Telephone Encounter (Signed)
I could not reach patient regarding schedule  °

## 2019-01-21 DIAGNOSIS — M79641 Pain in right hand: Secondary | ICD-10-CM | POA: Diagnosis not present

## 2019-01-25 DIAGNOSIS — I10 Essential (primary) hypertension: Secondary | ICD-10-CM | POA: Diagnosis not present

## 2019-01-25 DIAGNOSIS — E7800 Pure hypercholesterolemia, unspecified: Secondary | ICD-10-CM | POA: Diagnosis not present

## 2019-01-25 DIAGNOSIS — E039 Hypothyroidism, unspecified: Secondary | ICD-10-CM | POA: Diagnosis not present

## 2019-01-28 DIAGNOSIS — M79641 Pain in right hand: Secondary | ICD-10-CM | POA: Diagnosis not present

## 2019-01-31 DIAGNOSIS — M79641 Pain in right hand: Secondary | ICD-10-CM | POA: Diagnosis not present

## 2019-02-07 DIAGNOSIS — M79641 Pain in right hand: Secondary | ICD-10-CM | POA: Diagnosis not present

## 2019-02-16 DIAGNOSIS — M79641 Pain in right hand: Secondary | ICD-10-CM | POA: Diagnosis not present

## 2019-02-21 DIAGNOSIS — M79641 Pain in right hand: Secondary | ICD-10-CM | POA: Diagnosis not present

## 2019-02-23 DIAGNOSIS — E039 Hypothyroidism, unspecified: Secondary | ICD-10-CM | POA: Diagnosis not present

## 2019-03-01 DIAGNOSIS — M810 Age-related osteoporosis without current pathological fracture: Secondary | ICD-10-CM | POA: Diagnosis not present

## 2019-03-02 DIAGNOSIS — E039 Hypothyroidism, unspecified: Secondary | ICD-10-CM | POA: Diagnosis not present

## 2019-03-02 DIAGNOSIS — M810 Age-related osteoporosis without current pathological fracture: Secondary | ICD-10-CM | POA: Diagnosis not present

## 2019-03-02 DIAGNOSIS — I10 Essential (primary) hypertension: Secondary | ICD-10-CM | POA: Diagnosis not present

## 2019-03-14 ENCOUNTER — Ambulatory Visit: Payer: No Typology Code available for payment source | Attending: Internal Medicine

## 2019-03-14 DIAGNOSIS — Z20822 Contact with and (suspected) exposure to covid-19: Secondary | ICD-10-CM

## 2019-03-16 LAB — NOVEL CORONAVIRUS, NAA: SARS-CoV-2, NAA: NOT DETECTED

## 2019-04-21 DIAGNOSIS — M79641 Pain in right hand: Secondary | ICD-10-CM | POA: Diagnosis not present

## 2019-04-21 DIAGNOSIS — Z4789 Encounter for other orthopedic aftercare: Secondary | ICD-10-CM | POA: Diagnosis not present

## 2019-04-21 DIAGNOSIS — M79642 Pain in left hand: Secondary | ICD-10-CM | POA: Diagnosis not present

## 2019-05-12 DIAGNOSIS — M65311 Trigger thumb, right thumb: Secondary | ICD-10-CM | POA: Diagnosis not present

## 2019-05-12 DIAGNOSIS — M79641 Pain in right hand: Secondary | ICD-10-CM | POA: Diagnosis not present

## 2019-05-12 DIAGNOSIS — M79642 Pain in left hand: Secondary | ICD-10-CM | POA: Diagnosis not present

## 2019-05-12 DIAGNOSIS — M659 Synovitis and tenosynovitis, unspecified: Secondary | ICD-10-CM | POA: Diagnosis not present

## 2019-05-12 DIAGNOSIS — Z4789 Encounter for other orthopedic aftercare: Secondary | ICD-10-CM | POA: Diagnosis not present

## 2019-06-07 DIAGNOSIS — R300 Dysuria: Secondary | ICD-10-CM | POA: Diagnosis not present

## 2019-06-09 ENCOUNTER — Other Ambulatory Visit (HOSPITAL_COMMUNITY): Payer: Self-pay | Admitting: Internal Medicine

## 2019-06-09 ENCOUNTER — Other Ambulatory Visit: Payer: Self-pay | Admitting: Internal Medicine

## 2019-06-09 DIAGNOSIS — N390 Urinary tract infection, site not specified: Secondary | ICD-10-CM | POA: Diagnosis not present

## 2019-06-09 DIAGNOSIS — R7989 Other specified abnormal findings of blood chemistry: Secondary | ICD-10-CM | POA: Diagnosis not present

## 2019-06-09 DIAGNOSIS — Z87442 Personal history of urinary calculi: Secondary | ICD-10-CM | POA: Diagnosis not present

## 2019-06-09 DIAGNOSIS — N200 Calculus of kidney: Secondary | ICD-10-CM | POA: Diagnosis not present

## 2019-06-09 DIAGNOSIS — N3000 Acute cystitis without hematuria: Secondary | ICD-10-CM | POA: Diagnosis not present

## 2019-06-09 DIAGNOSIS — E875 Hyperkalemia: Secondary | ICD-10-CM | POA: Diagnosis not present

## 2019-06-09 DIAGNOSIS — R109 Unspecified abdominal pain: Secondary | ICD-10-CM | POA: Diagnosis not present

## 2019-06-10 ENCOUNTER — Ambulatory Visit (HOSPITAL_COMMUNITY)
Admission: RE | Admit: 2019-06-10 | Discharge: 2019-06-10 | Disposition: A | Discharge status: Home / Self Care | Payer: Medicare Other | Source: Ambulatory Visit | Attending: Internal Medicine | Admitting: Internal Medicine

## 2019-06-10 ENCOUNTER — Other Ambulatory Visit: Payer: Self-pay

## 2019-06-10 ENCOUNTER — Other Ambulatory Visit (HOSPITAL_COMMUNITY)
Admission: RE | Admit: 2019-06-10 | Discharge: 2019-06-10 | Disposition: A | Discharge status: Home / Self Care | Payer: Medicare Other | Source: Ambulatory Visit | Attending: Urology | Admitting: Urology

## 2019-06-10 ENCOUNTER — Other Ambulatory Visit: Payer: Self-pay | Admitting: Urology

## 2019-06-10 DIAGNOSIS — Z20822 Contact with and (suspected) exposure to covid-19: Secondary | ICD-10-CM | POA: Diagnosis not present

## 2019-06-10 DIAGNOSIS — N1330 Unspecified hydronephrosis: Secondary | ICD-10-CM | POA: Insufficient documentation

## 2019-06-10 DIAGNOSIS — N201 Calculus of ureter: Secondary | ICD-10-CM | POA: Diagnosis not present

## 2019-06-10 DIAGNOSIS — N200 Calculus of kidney: Secondary | ICD-10-CM | POA: Diagnosis not present

## 2019-06-10 DIAGNOSIS — R7989 Other specified abnormal findings of blood chemistry: Secondary | ICD-10-CM | POA: Diagnosis not present

## 2019-06-10 DIAGNOSIS — N23 Unspecified renal colic: Secondary | ICD-10-CM | POA: Diagnosis not present

## 2019-06-10 DIAGNOSIS — N132 Hydronephrosis with renal and ureteral calculous obstruction: Secondary | ICD-10-CM | POA: Diagnosis not present

## 2019-06-10 DIAGNOSIS — Z01812 Encounter for preprocedural laboratory examination: Secondary | ICD-10-CM | POA: Insufficient documentation

## 2019-06-10 LAB — SARS CORONAVIRUS 2 (TAT 6-24 HRS): SARS Coronavirus 2: NEGATIVE

## 2019-06-10 NOTE — Progress Notes (Signed)
Patient called for screening for lithotripsy on 06/13/19. To arrive at 10:30. History and medications reviewed. Instructions given. Patient has ride and care giver arranged. Covid test today. Instructed to quarantine.

## 2019-06-13 ENCOUNTER — Ambulatory Visit (HOSPITAL_BASED_OUTPATIENT_CLINIC_OR_DEPARTMENT_OTHER)
Admission: RE | Admit: 2019-06-13 | Discharge: 2019-06-13 | Disposition: A | Discharge status: Home / Self Care | Payer: Medicare Other | Attending: Urology | Admitting: Urology

## 2019-06-13 ENCOUNTER — Encounter (HOSPITAL_BASED_OUTPATIENT_CLINIC_OR_DEPARTMENT_OTHER): Payer: Self-pay | Admitting: Urology

## 2019-06-13 ENCOUNTER — Ambulatory Visit (HOSPITAL_COMMUNITY): Payer: Medicare Other

## 2019-06-13 ENCOUNTER — Encounter (HOSPITAL_BASED_OUTPATIENT_CLINIC_OR_DEPARTMENT_OTHER)
Admission: RE | Disposition: A | Discharge status: Home / Self Care | Payer: Self-pay | Source: Home / Self Care | Attending: Urology

## 2019-06-13 DIAGNOSIS — Z885 Allergy status to narcotic agent status: Secondary | ICD-10-CM | POA: Diagnosis not present

## 2019-06-13 DIAGNOSIS — Z79899 Other long term (current) drug therapy: Secondary | ICD-10-CM | POA: Insufficient documentation

## 2019-06-13 DIAGNOSIS — Z791 Long term (current) use of non-steroidal anti-inflammatories (NSAID): Secondary | ICD-10-CM | POA: Diagnosis not present

## 2019-06-13 DIAGNOSIS — Z91040 Latex allergy status: Secondary | ICD-10-CM | POA: Diagnosis not present

## 2019-06-13 DIAGNOSIS — N201 Calculus of ureter: Secondary | ICD-10-CM | POA: Diagnosis not present

## 2019-06-13 DIAGNOSIS — N200 Calculus of kidney: Secondary | ICD-10-CM | POA: Diagnosis not present

## 2019-06-13 DIAGNOSIS — Z01818 Encounter for other preprocedural examination: Secondary | ICD-10-CM | POA: Diagnosis not present

## 2019-06-13 DIAGNOSIS — N202 Calculus of kidney with calculus of ureter: Secondary | ICD-10-CM | POA: Diagnosis not present

## 2019-06-13 DIAGNOSIS — Z7982 Long term (current) use of aspirin: Secondary | ICD-10-CM | POA: Insufficient documentation

## 2019-06-13 HISTORY — PX: EXTRACORPOREAL SHOCK WAVE LITHOTRIPSY: SHX1557

## 2019-06-13 SURGERY — LITHOTRIPSY, ESWL
Anesthesia: LOCAL | Laterality: Left

## 2019-06-13 MED ORDER — SODIUM CHLORIDE 0.9 % IV SOLN
INTRAVENOUS | Status: DC
  Filled 2019-06-13: qty 1000

## 2019-06-13 MED ORDER — CIPROFLOXACIN HCL 500 MG PO TABS
500.0000 mg | ORAL_TABLET | ORAL | Status: AC
  Administered 2019-06-13: 500 mg via ORAL
  Filled 2019-06-13: qty 1

## 2019-06-13 MED ORDER — DIPHENHYDRAMINE HCL 25 MG PO CAPS
ORAL_CAPSULE | ORAL | Status: AC
  Filled 2019-06-13: qty 1

## 2019-06-13 MED ORDER — DIAZEPAM 5 MG PO TABS
ORAL_TABLET | ORAL | Status: AC
  Filled 2019-06-13: qty 2

## 2019-06-13 MED ORDER — CIPROFLOXACIN HCL 500 MG PO TABS
ORAL_TABLET | ORAL | Status: AC
  Filled 2019-06-13: qty 1

## 2019-06-13 MED ORDER — HYDROCODONE-ACETAMINOPHEN 5-325 MG PO TABS: 1.0000 | ORAL_TABLET | ORAL | 0 refills | Status: AC | PRN

## 2019-06-13 MED ORDER — DIAZEPAM 5 MG PO TABS
10.0000 mg | ORAL_TABLET | ORAL | Status: AC
  Administered 2019-06-13: 10 mg via ORAL
  Filled 2019-06-13: qty 2

## 2019-06-13 MED ORDER — DIPHENHYDRAMINE HCL 25 MG PO CAPS
25.0000 mg | ORAL_CAPSULE | ORAL | Status: AC
  Administered 2019-06-13: 25 mg via ORAL
  Filled 2019-06-13: qty 1

## 2019-06-13 NOTE — Op Note (Signed)
See [facility] OP note scanned into chart. Also because of the size, density, location and other factors that cannot be anticipated I feel this will likely be a staged procedure. This fact supersedes any indication in the scanned [facility] operative note to the contrary.  

## 2019-06-13 NOTE — Discharge Instructions (Signed)
Lithotripsy, Care After This sheet gives you information about how to care for yourself after your procedure. Your health care provider may also give you more specific instructions. If you have problems or questions, contact your health care provider. What can I expect after the procedure? After the procedure, it is common to have:  Some blood in your urine. This should only last for a few days.  Soreness in your back, sides, or upper abdomen for a few days.  Blotches or bruises on your back where the pressure wave entered the skin.  Pain, discomfort, or nausea when pieces (fragments) of the kidney stone move through the tube that carries urine from the kidney to the bladder (ureter). Stone fragments may pass soon after the procedure, but they may continue to pass for up to 4-8 weeks. ? If you have severe pain or nausea, contact your health care provider. This may be caused by a large stone that was not broken up, and this may mean that you need more treatment.  Some pain or discomfort during urination.  Some pain or discomfort in the lower abdomen or (in men) at the base of the penis. Follow these instructions at home: Medicines  Take over-the-counter and prescription medicines only as told by your health care provider.  If you were prescribed an antibiotic medicine, take it as told by your health care provider. Do not stop taking the antibiotic even if you start to feel better.  Do not drive for 24 hours if you were given a medicine to help you relax (sedative).  Do not drive or use heavy machinery while taking prescription pain medicine. Eating and drinking      Drink enough water and fluids to keep your urine clear or pale yellow. This helps any remaining pieces of the stone to pass. It can also help prevent new stones from forming.  Eat plenty of fresh fruits and vegetables.  Follow instructions from your health care provider about eating and drinking restrictions. You may be  instructed: ? To reduce how much salt (sodium) you eat or drink. Check ingredients and nutrition facts on packaged foods and beverages. ? To reduce how much meat you eat.  Eat the recommended amount of calcium for your age and gender. Ask your health care provider how much calcium you should have. General instructions  Get plenty of rest.  Most people can resume normal activities 1-2 days after the procedure. Ask your health care provider what activities are safe for you.  Your health care provider may direct you to lie in a certain position (postural drainage) and tap firmly (percuss) over your kidney area to help stone fragments pass. Follow instructions as told by your health care provider.  If directed, strain all urine through the strainer that was provided by your health care provider. ? Keep all fragments for your health care provider to see. Any stones that are found may be sent to a medical lab for examination. The stone may be as small as a grain of salt.  Keep all follow-up visits as told by your health care provider. This is important. Contact a health care provider if:  You have pain that is severe or does not get better with medicine.  You have nausea that is severe or does not go away.  You have blood in your urine longer than your health care provider told you to expect.  You have more blood in your urine.  You have pain during urination that does   not go away.  You urinate more frequently than usual and this does not go away.  You develop a rash or any other possible signs of an allergic reaction. Get help right away if:  You have severe pain in your back, sides, or upper abdomen.  You have severe pain while urinating.  Your urine is very dark red.  You have blood in your stool (feces).  You cannot pass any urine at all.  You feel a strong urge to urinate after emptying your bladder.  You have a fever or chills.  You develop shortness of breath,  difficulty breathing, or chest pain.  You have severe nausea that leads to persistent vomiting.  You faint. Summary  After this procedure, it is common to have some pain, discomfort, or nausea when pieces (fragments) of the kidney stone move through the tube that carries urine from the kidney to the bladder (ureter). If this pain or nausea is severe, however, you should contact your health care provider.  Most people can resume normal activities 1-2 days after the procedure. Ask your health care provider what activities are safe for you.  Drink enough water and fluids to keep your urine clear or pale yellow. This helps any remaining pieces of the stone to pass, and it can help prevent new stones from forming.  If directed, strain your urine and keep all fragments for your health care provider to see. Fragments or stones may be as small as a grain of salt.  Get help right away if you have severe pain in your back, sides, or upper abdomen or have severe pain while urinating. This information is not intended to replace advice given to you by your health care provider. Make sure you discuss any questions you have with your health care provider. Document Revised: 06/14/2018 Document Reviewed: 01/23/2016 Elsevier Patient Education  2020 [employer].  

## 2019-06-13 NOTE — H&P (Signed)
See scanned H&P

## 2019-06-16 DIAGNOSIS — M25562 Pain in left knee: Secondary | ICD-10-CM | POA: Diagnosis not present

## 2019-06-16 DIAGNOSIS — M1712 Unilateral primary osteoarthritis, left knee: Secondary | ICD-10-CM | POA: Diagnosis not present

## 2019-06-27 DIAGNOSIS — N200 Calculus of kidney: Secondary | ICD-10-CM | POA: Diagnosis not present

## 2019-07-15 DIAGNOSIS — N202 Calculus of kidney with calculus of ureter: Secondary | ICD-10-CM | POA: Diagnosis not present

## 2019-08-29 DIAGNOSIS — E039 Hypothyroidism, unspecified: Secondary | ICD-10-CM | POA: Diagnosis not present

## 2019-09-01 DIAGNOSIS — M810 Age-related osteoporosis without current pathological fracture: Secondary | ICD-10-CM | POA: Diagnosis not present

## 2019-09-01 DIAGNOSIS — E039 Hypothyroidism, unspecified: Secondary | ICD-10-CM | POA: Diagnosis not present

## 2019-09-01 DIAGNOSIS — I10 Essential (primary) hypertension: Secondary | ICD-10-CM | POA: Diagnosis not present

## 2019-09-07 DIAGNOSIS — N202 Calculus of kidney with calculus of ureter: Secondary | ICD-10-CM | POA: Diagnosis not present

## 2019-09-14 DIAGNOSIS — N200 Calculus of kidney: Secondary | ICD-10-CM | POA: Diagnosis not present

## 2019-09-26 DIAGNOSIS — N23 Unspecified renal colic: Secondary | ICD-10-CM | POA: Diagnosis not present

## 2019-09-26 DIAGNOSIS — R8279 Other abnormal findings on microbiological examination of urine: Secondary | ICD-10-CM | POA: Diagnosis not present

## 2019-09-26 DIAGNOSIS — R311 Benign essential microscopic hematuria: Secondary | ICD-10-CM | POA: Diagnosis not present

## 2019-09-26 DIAGNOSIS — N200 Calculus of kidney: Secondary | ICD-10-CM | POA: Diagnosis not present

## 2019-10-10 DIAGNOSIS — M25511 Pain in right shoulder: Secondary | ICD-10-CM | POA: Diagnosis not present

## 2019-10-10 DIAGNOSIS — M7542 Impingement syndrome of left shoulder: Secondary | ICD-10-CM | POA: Diagnosis not present

## 2019-10-10 DIAGNOSIS — M25512 Pain in left shoulder: Secondary | ICD-10-CM | POA: Diagnosis not present

## 2019-10-10 DIAGNOSIS — M7541 Impingement syndrome of right shoulder: Secondary | ICD-10-CM | POA: Diagnosis not present

## 2019-10-11 DIAGNOSIS — R311 Benign essential microscopic hematuria: Secondary | ICD-10-CM | POA: Diagnosis not present

## 2019-10-27 DIAGNOSIS — R311 Benign essential microscopic hematuria: Secondary | ICD-10-CM | POA: Diagnosis not present

## 2019-10-27 DIAGNOSIS — D7389 Other diseases of spleen: Secondary | ICD-10-CM | POA: Diagnosis not present

## 2019-10-27 DIAGNOSIS — N200 Calculus of kidney: Secondary | ICD-10-CM | POA: Diagnosis not present

## 2019-10-27 DIAGNOSIS — N201 Calculus of ureter: Secondary | ICD-10-CM | POA: Diagnosis not present

## 2019-10-27 DIAGNOSIS — I700 Atherosclerosis of aorta: Secondary | ICD-10-CM | POA: Diagnosis not present

## 2019-12-12 DIAGNOSIS — L739 Follicular disorder, unspecified: Secondary | ICD-10-CM | POA: Diagnosis not present

## 2019-12-12 DIAGNOSIS — Z23 Encounter for immunization: Secondary | ICD-10-CM | POA: Diagnosis not present

## 2020-01-09 DIAGNOSIS — R253 Fasciculation: Secondary | ICD-10-CM | POA: Diagnosis not present

## 2020-01-09 DIAGNOSIS — M159 Polyosteoarthritis, unspecified: Secondary | ICD-10-CM | POA: Diagnosis not present

## 2020-01-09 DIAGNOSIS — M170 Bilateral primary osteoarthritis of knee: Secondary | ICD-10-CM | POA: Diagnosis not present

## 2020-01-30 ENCOUNTER — Other Ambulatory Visit: Payer: Self-pay | Admitting: Internal Medicine

## 2020-01-30 DIAGNOSIS — Z1231 Encounter for screening mammogram for malignant neoplasm of breast: Secondary | ICD-10-CM

## 2020-02-16 ENCOUNTER — Telehealth: Payer: Self-pay

## 2020-02-16 ENCOUNTER — Inpatient Hospital Stay: Payer: Medicare Other | Attending: Adult Health | Admitting: Adult Health

## 2020-02-16 NOTE — Telephone Encounter (Signed)
This LPN called pt regarding [place]/NS to appt with Wilber Bihari, NP. Pt did not answer. LVM for pt to call back and r/s appt.

## 2020-02-16 NOTE — Progress Notes (Deleted)
CLINIC:  Survivorship   REASON FOR VISIT:  Routine follow-up for history of breast cancer.   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  (2) treated in the neoadjuvant setting,              (a) completed 4 dose dense cycles of doxorubicin/ cyclophosphamide 05/19/2013. Cycle 4 was delayed one week because of bad weather             (b) received 7 of 12 planned weekly doses of carboplatin/ paclitaxel, with paclitaxel given alone with the 8th cycle, after which chemotherapy was stopped because of cytopenias and neuropathy; final dose 08/04/2013.  (3) status post right lumpectomy and sentinel lymph node sampling 09/01/2013 showing a complete pathologic response.  (4) adjuvant radiation therapy completed 12/21/2013.  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 Diona Fanti  (7) thyroiditis: followed by Dr. Wilson Singer   INTERVAL HISTORY:  Sydney Ford presents to the [facility] today for routine follow-up for her history of breast cancer.  Overall, she reports feeling quite well.   Her most recent mammogram was completed in 12/2018 and showed no evidence of malignancy and breast density category B.  She has her next mammogram scheduled on 03/12/2020.      REVIEW OF SYSTEMS:  Review of Systems  Constitutional: Negative for appetite change, chills, fatigue, fever and unexpected weight change.  HENT:   Negative for hearing loss, lump/mass and trouble swallowing.   Eyes: Negative for eye problems and icterus.  Respiratory: Negative for chest tightness, cough and shortness of breath.    Cardiovascular: Negative for chest pain, leg swelling and palpitations.  Gastrointestinal: Negative for abdominal distention, abdominal pain, constipation, diarrhea, nausea and vomiting.  Endocrine: Negative for hot flashes.  Genitourinary: Negative for difficulty urinating.   Musculoskeletal: Negative for arthralgias.  Skin: Negative for itching and rash.  Neurological: Negative for dizziness, extremity weakness, headaches and numbness.  Hematological: Negative for adenopathy. Does not bruise/bleed easily.  Psychiatric/Behavioral: Negative for depression. The patient is not nervous/anxious.    Breast: Denies any new nodularity, masses, tenderness, nipple changes, or nipple discharge.       PAST MEDICAL/SURGICAL HISTORY:  Past Medical History:  Diagnosis Date  . Anemia    2005 - related to heavy menses with the thyroid probl  . Arthritis    oa; lower back pain - hx of injury yrs ago-occas flare ups  . Breast cancer ([facility]) 02/28/13   right, 11 o'clock  . Cancer [facility])    new dx of right breast cancer - planning chemo first  . Chronic kidney disease    kidney stone  . Cold (disease) 03/21/12   couple of weeks ago - pt states much better now  . Depression   . GERD (gastroesophageal reflux disease)    no meds except occas tums  . Headache(784.0)    occas migraine  . History of kidney stones    "passed 9 stones"  . Hx of radiation therapy 11/16/13- 12/21/13   right breast 5000 cGy in 25 sessions  . Hyperlipidemia   . Hypertension   . Hypothyroidism    hashimoto's  . Osteoporosis   .  Personal history of chemotherapy   . Personal history of radiation therapy   . Pneumonia    2014  . Thyroid disease    Past Surgical History:  Procedure Laterality Date  . AXILLARY SENTINEL NODE BIOPSY Right 09/01/2013   Procedure: RIGHT AXILLARY SENTINEL NODE BIOPSY;  Surgeon: Matthew Wakefield, MD;  Location: [facility];  Service: General;  Laterality: Right;  PECTORAL BLOCK  WITH ANESTHESIA   . BREAST LUMPECTOMY Right 2015  . BREAST LUMPECTOMY WITH RADIOACTIVE SEED LOCALIZATION Right 09/01/2013   Procedure: BREAST LUMPECTOMY WITH RADIOACTIVE SEED LOCALIZATION;  Surgeon: Matthew Wakefield, MD;  Location: [facility];  Service: General;  Laterality: Right;  . CESAREAN SECTION  1987  . CYSTOSCOPY WITH RETROGRADE PYELOGRAM, URETEROSCOPY AND STENT PLACEMENT Right 06/30/2013   Procedure: CYSTOSCOPY WITH RETROGRADE PYELOGRAM, URETEROSCOPY, STONE EXTRACTION AND  STENT PLACEMENT;  Surgeon: Stephen Dahlstedt, MD;  Location: [facility];  Service: Urology;  Laterality: Right;  . EXTRACORPOREAL SHOCK WAVE LITHOTRIPSY Left 06/13/2019   Procedure: EXTRACORPOREAL SHOCK WAVE LITHOTRIPSY (ESWL);  Surgeon: Bell, Eugene D III, MD;  Location: [facility];  Service: Urology;  Laterality: Left;  . HOLMIUM LASER APPLICATION Right 06/30/2013   Procedure: HOLMIUM LASER APPLICATION;  Surgeon: Stephen Dahlstedt, MD;  Location: [facility];  Service: Urology;  Laterality: Right;  . PORT-A-CATH REMOVAL Left 09/01/2013   Procedure: REMOVAL PORT-A-CATH;  Surgeon: Matthew Wakefield, MD;  Location: [facility];  Service: General;  Laterality: Left;  . PORTACATH PLACEMENT N/A 03/23/2013   Procedure: INSERTION PORT-A-CATH;  Surgeon: Matthew Wakefield, MD;  Location: [facility];  Service: General;  Laterality: N/A;  . TONSILLECTOMY    . uterine ablation  2008   no menstrual periods since procedure     ALLERGIES:  Allergies  Allergen Reactions  . Codeine Itching and Nausea Only    REACTION: Nausea and itching  . Compazine [Prochlorperazine Edisylate] Other (See Comments)    Head aches   . Other     Close contact with Some recycled plastics cause whelps  . Latex Rash  . Tape Rash    Needs OPSITE dressing with port access     CURRENT MEDICATIONS:  Outpatient Encounter Medications as of 02/16/2020  Medication Sig Note  . atorvastatin (LIPITOR) 20 MG tablet Take 20  mg by mouth every evening.    . b complex vitamins capsule Take 1 capsule by mouth daily.   . carvedilol (COREG) 6.25 MG tablet Take 6.25 mg by mouth 2 (two) times daily with a meal.   . Cholecalciferol (VITAMIN D3) 10 MCG (400 UNIT) CHEW Chew by mouth.   . HYDROcodone-acetaminophen (NORCO/VICODIN) 5-325 MG tablet Take 1 tablet by mouth every 4 (four) hours as needed for moderate pain.   . irbesartan (AVAPRO) 300 MG tablet Take 300 mg by mouth daily.   . levothyroxine (SYNTHROID) 100 MCG tablet Take 100 mcg by mouth daily before breakfast.   . meloxicam (MOBIC) 15 MG tablet Take 15 mg by mouth daily.   . omeprazole (PRILOSEC) 40 MG capsule Take 40 mg by mouth daily.  01/17/2014: Received from: External Pharmacy  . thyroid (ARMOUR) 90 MG tablet Take 90 mg by mouth every morning.   . traMADol (ULTRAM) 50 MG tablet Take 1-2 tablets (50-100 mg total) by mouth every 6 (six) hours as needed.   . VIACTIV CALCIUM PLUS D 650-12.5-40 MG-MCG CHEW Chew by mouth daily.    No facility-administered encounter medications on file as of 02/16/2020.       ONCOLOGIC FAMILY HISTORY:  Family History  Problem Relation Age of Onset  . Heart failure Mother   . Heart attack Father   . Stroke Paternal Uncle   . Heart failure Maternal Grandmother   . Heart attack Maternal Grandfather   . Throat cancer Paternal Grandmother        dx late 1s; non smoker or drinker  . Heart attack Paternal Grandfather   . Schizophrenia Son 18  . Heart attack Paternal Uncle   . Autoimmune disease Maternal Aunt        lupus, scelraderma, reynolds disease, and others    GENETIC COUNSELING/TESTING: ***  SOCIAL HISTORY:  TASHYRA ADDUCI is /single/married/divorced/widowed/separated and lives alone/with her spouse/family/friend in (city), [place].  She has (#) children and they live in (city).  Sydney Ford is currently retired/disabled/working part-time/full-time as ***.  She denies any current or history of tobacco, alcohol, or  illicit drug use.     PHYSICAL EXAMINATION:  Vital Signs: There were no vitals filed for this visit. There were no vitals filed for this visit. General: Well-nourished, well-appearing female in no acute distress.  Unaccompanied/Accompanied by***** today.   HEENT: Head is normocephalic.  Pupils equal and reactive to light. Conjunctivae clear without exudate.  Sclerae anicteric. Oral mucosa is pink, moist.  Oropharynx is pink without lesions or erythema.  Lymph: No cervical, supraclavicular, or infraclavicular lymphadenopathy noted on palpation.  Cardiovascular: Regular rate and rhythm.Marland Kitchen Respiratory: Clear to auscultation bilaterally. Chest expansion symmetric; breathing non-labored.  Breast Exam:  -Left breast: No appreciable masses on palpation. No skin redness, thickening, or peau d'orange appearance; no nipple retraction or nipple discharge; mild distortion in symmetry at previous lumpectomy site***healed scar without erythema or nodularity.  -Right breast: No appreciable masses on palpation. No skin redness, thickening, or peau d'orange appearance; no nipple retraction or nipple discharge; mild distortion in symmetry at previous lumpectomy site***healed scar without erythema or nodularity. -Axilla: No axillary adenopathy bilaterally.  GI: Abdomen soft and round; non-tender, non-distended. Bowel sounds normoactive. No hepatosplenomegaly.   GU: Deferred.  Neuro: No focal deficits. Steady gait.  Psych: Mood and affect normal and appropriate for situation.  MSK: No focal spinal tenderness to palpation, full range of motion in bilateral upper extremities Extremities: No edema. Skin: Warm and dry.  LABORATORY DATA:  None for this visit***   DIAGNOSTIC IMAGING:  Most recent mammogram:  CLINICAL DATA:  History of right breast cancer status post lumpectomy in 2015.  EXAM: DIGITAL DIAGNOSTIC BILATERAL MAMMOGRAM WITH CAD AND TOMO  COMPARISON:  Previous exam(s).  ACR Breast Density  Category b: There are scattered areas of fibroglandular density.  FINDINGS: Stable lumpectomy changes are seen in the right breast. No suspicious mass or malignant type microcalcifications identified in either breast.  Mammographic images were processed with CAD.  IMPRESSION: No evidence of malignancy in either breast.  RECOMMENDATION: Bilateral screening mammogram in 1 year is recommended.  I have discussed the findings and recommendations with the patient. If applicable, a reminder letter will be sent to the patient regarding the next appointment.  BI-RADS CATEGORY  2: Benign.   Electronically Signed   By: Lillia Mountain M.D.   On: 01/03/2019 15:58   ASSESSMENT AND PLAN:  Ms.. Ford is a pleasant 64 y.o. female with history of Stage IIA right breast invasive ductal carcinoma, ER-/PR-/HER2-, diagnosed in 01/2013, treated with neoadjuvant chemotherapy,  Lumpectomy and adjuvant radiation therapy.  [facility] for [facility] and routine follow-up.   1.  History of breast cancer:  Sydney Ford is currently clinically and radiographically without evidence of disease or recurrence of breast cancer. She is overdue for her mammogram which is scheduled on 03/12/2020 [facility].  She will return in one year for long ter follow up.   I encouraged her to call me with any questions or concerns before her next visit at the cancer center, and I would be happy to see her sooner, if needed.    #. Problem(s) at Visit___________________.  #. Bone health:  She was given education on specific food and activities to promote bone health.  #. Cancer screening:  Due to Sydney Ford history and her age, she should receive screening for skin cancers, colon cancer, and ***gynecologic cancers. She was encouraged to follow-up with her PCP for appropriate cancer screenings.   #. Health maintenance and wellness promotion: Sydney Ford was encouraged to consume 5-7  servings of fruits and vegetables per day. She was also encouraged to engage in moderate to vigorous exercise for 30 minutes per day most days of the week. She was instructed to limit her alcohol consumption and continue to abstain from tobacco use/was encouraged stop smoking.  ***    Dispo:  -Return to cancer center in one year for LTS follow up -Mammogram 02/2020 as scheduled  Total encounter time: 20 minutes*  Wilber Bihari, NP 02/16/20 9:26 AM Medical Oncology and Hematology [facility] [place], [place] [address] Tel. [phone]    Fax. [phone]  *Total Encounter Time as defined by the Centers for Medicare and Medicaid Services includes, in addition to the face-to-face time of a patient visit (documented in the note above) non-face-to-face time: obtaining and reviewing outside history, ordering and reviewing medications, tests or procedures, care coordination (communications with other health care professionals or caregivers) and documentation in the medical record.     Note: PRIMARY CARE PROVIDER Jani Gravel, [place] [phone]

## 2020-03-05 DIAGNOSIS — M810 Age-related osteoporosis without current pathological fracture: Secondary | ICD-10-CM | POA: Diagnosis not present

## 2020-03-12 ENCOUNTER — Ambulatory Visit
Admission: RE | Admit: 2020-03-12 | Discharge: 2020-03-12 | Disposition: A | Discharge status: Home / Self Care | Payer: Medicare Other | Source: Ambulatory Visit | Attending: Internal Medicine | Admitting: Internal Medicine

## 2020-03-12 ENCOUNTER — Other Ambulatory Visit: Payer: Self-pay

## 2020-03-12 DIAGNOSIS — Z1231 Encounter for screening mammogram for malignant neoplasm of breast: Secondary | ICD-10-CM | POA: Diagnosis not present

## 2020-04-10 DIAGNOSIS — E039 Hypothyroidism, unspecified: Secondary | ICD-10-CM | POA: Diagnosis not present

## 2020-04-10 DIAGNOSIS — I10 Essential (primary) hypertension: Secondary | ICD-10-CM | POA: Diagnosis not present

## 2020-04-17 DIAGNOSIS — M810 Age-related osteoporosis without current pathological fracture: Secondary | ICD-10-CM | POA: Diagnosis not present

## 2020-04-17 DIAGNOSIS — I10 Essential (primary) hypertension: Secondary | ICD-10-CM | POA: Diagnosis not present

## 2020-04-17 DIAGNOSIS — E039 Hypothyroidism, unspecified: Secondary | ICD-10-CM | POA: Diagnosis not present

## 2020-09-19 DIAGNOSIS — M810 Age-related osteoporosis without current pathological fracture: Secondary | ICD-10-CM | POA: Diagnosis not present

## 2020-11-15 DIAGNOSIS — Z0000 Encounter for general adult medical examination without abnormal findings: Secondary | ICD-10-CM | POA: Diagnosis not present

## 2020-11-15 DIAGNOSIS — E039 Hypothyroidism, unspecified: Secondary | ICD-10-CM | POA: Diagnosis not present

## 2020-11-15 DIAGNOSIS — I10 Essential (primary) hypertension: Secondary | ICD-10-CM | POA: Diagnosis not present

## 2020-11-15 DIAGNOSIS — E7800 Pure hypercholesterolemia, unspecified: Secondary | ICD-10-CM | POA: Diagnosis not present

## 2020-11-15 DIAGNOSIS — E559 Vitamin D deficiency, unspecified: Secondary | ICD-10-CM | POA: Diagnosis not present

## 2020-11-20 DIAGNOSIS — K219 Gastro-esophageal reflux disease without esophagitis: Secondary | ICD-10-CM | POA: Diagnosis not present

## 2020-11-20 DIAGNOSIS — E039 Hypothyroidism, unspecified: Secondary | ICD-10-CM | POA: Diagnosis not present

## 2020-11-20 DIAGNOSIS — E7800 Pure hypercholesterolemia, unspecified: Secondary | ICD-10-CM | POA: Diagnosis not present

## 2020-11-20 DIAGNOSIS — I10 Essential (primary) hypertension: Secondary | ICD-10-CM | POA: Diagnosis not present

## 2020-11-20 DIAGNOSIS — E559 Vitamin D deficiency, unspecified: Secondary | ICD-10-CM | POA: Diagnosis not present

## 2021-06-19 LAB — COLOGUARD: COLOGUARD: NEGATIVE

## 2021-09-25 DIAGNOSIS — M810 Age-related osteoporosis without current pathological fracture: Secondary | ICD-10-CM | POA: Diagnosis not present

## 2021-11-28 DIAGNOSIS — E7800 Pure hypercholesterolemia, unspecified: Secondary | ICD-10-CM | POA: Diagnosis not present

## 2021-12-05 DIAGNOSIS — M159 Polyosteoarthritis, unspecified: Secondary | ICD-10-CM | POA: Diagnosis not present

## 2021-12-05 DIAGNOSIS — E7800 Pure hypercholesterolemia, unspecified: Secondary | ICD-10-CM | POA: Diagnosis not present

## 2021-12-05 DIAGNOSIS — R7989 Other specified abnormal findings of blood chemistry: Secondary | ICD-10-CM | POA: Diagnosis not present

## 2021-12-05 DIAGNOSIS — I10 Essential (primary) hypertension: Secondary | ICD-10-CM | POA: Diagnosis not present

## 2021-12-05 DIAGNOSIS — K219 Gastro-esophageal reflux disease without esophagitis: Secondary | ICD-10-CM | POA: Diagnosis not present

## 2021-12-05 DIAGNOSIS — E559 Vitamin D deficiency, unspecified: Secondary | ICD-10-CM | POA: Diagnosis not present

## 2021-12-05 DIAGNOSIS — E039 Hypothyroidism, unspecified: Secondary | ICD-10-CM | POA: Diagnosis not present

## 2021-12-19 DIAGNOSIS — I10 Essential (primary) hypertension: Secondary | ICD-10-CM | POA: Diagnosis not present

## 2021-12-19 DIAGNOSIS — Z23 Encounter for immunization: Secondary | ICD-10-CM | POA: Diagnosis not present

## 2022-03-31 DIAGNOSIS — M810 Age-related osteoporosis without current pathological fracture: Secondary | ICD-10-CM | POA: Diagnosis not present

## 2022-05-28 DIAGNOSIS — R300 Dysuria: Secondary | ICD-10-CM | POA: Diagnosis not present

## 2022-05-28 DIAGNOSIS — E7800 Pure hypercholesterolemia, unspecified: Secondary | ICD-10-CM | POA: Diagnosis not present

## 2022-05-28 DIAGNOSIS — Z0000 Encounter for general adult medical examination without abnormal findings: Secondary | ICD-10-CM | POA: Diagnosis not present

## 2022-06-04 DIAGNOSIS — E559 Vitamin D deficiency, unspecified: Secondary | ICD-10-CM | POA: Diagnosis not present

## 2022-06-04 DIAGNOSIS — Z0000 Encounter for general adult medical examination without abnormal findings: Secondary | ICD-10-CM | POA: Diagnosis not present

## 2022-06-04 DIAGNOSIS — C50911 Malignant neoplasm of unspecified site of right female breast: Secondary | ICD-10-CM | POA: Diagnosis not present

## 2022-06-04 DIAGNOSIS — I10 Essential (primary) hypertension: Secondary | ICD-10-CM | POA: Diagnosis not present

## 2022-06-04 DIAGNOSIS — E7800 Pure hypercholesterolemia, unspecified: Secondary | ICD-10-CM | POA: Diagnosis not present

## 2022-06-04 DIAGNOSIS — M170 Bilateral primary osteoarthritis of knee: Secondary | ICD-10-CM | POA: Diagnosis not present

## 2022-06-04 DIAGNOSIS — R7989 Other specified abnormal findings of blood chemistry: Secondary | ICD-10-CM | POA: Diagnosis not present

## 2022-06-04 DIAGNOSIS — Z791 Long term (current) use of non-steroidal anti-inflammatories (NSAID): Secondary | ICD-10-CM | POA: Diagnosis not present

## 2022-06-04 DIAGNOSIS — E039 Hypothyroidism, unspecified: Secondary | ICD-10-CM | POA: Diagnosis not present

## 2022-11-03 ENCOUNTER — Encounter (HOSPITAL_COMMUNITY): Payer: Self-pay

## 2022-11-03 ENCOUNTER — Other Ambulatory Visit: Payer: Self-pay

## 2022-11-03 ENCOUNTER — Emergency Department (HOSPITAL_COMMUNITY)
Admission: EM | Admit: 2022-11-03 | Discharge: 2022-11-03 | Disposition: A | Discharge status: Home / Self Care | Payer: Medicare Other | Attending: Emergency Medicine | Admitting: Emergency Medicine

## 2022-11-03 ENCOUNTER — Emergency Department (HOSPITAL_COMMUNITY): Payer: Medicare Other

## 2022-11-03 DIAGNOSIS — N132 Hydronephrosis with renal and ureteral calculous obstruction: Secondary | ICD-10-CM | POA: Insufficient documentation

## 2022-11-03 DIAGNOSIS — Z91040 Latex allergy status: Secondary | ICD-10-CM | POA: Diagnosis not present

## 2022-11-03 DIAGNOSIS — Z79899 Other long term (current) drug therapy: Secondary | ICD-10-CM | POA: Diagnosis not present

## 2022-11-03 DIAGNOSIS — N200 Calculus of kidney: Secondary | ICD-10-CM

## 2022-11-03 DIAGNOSIS — R109 Unspecified abdominal pain: Secondary | ICD-10-CM | POA: Diagnosis not present

## 2022-11-03 DIAGNOSIS — K5730 Diverticulosis of large intestine without perforation or abscess without bleeding: Secondary | ICD-10-CM | POA: Diagnosis not present

## 2022-11-03 LAB — URINALYSIS, ROUTINE W REFLEX MICROSCOPIC
Bilirubin Urine: NEGATIVE
Glucose, UA: NEGATIVE mg/dL
Hgb urine dipstick: NEGATIVE
Ketones, ur: NEGATIVE mg/dL
Leukocytes,Ua: NEGATIVE
Nitrite: NEGATIVE
Protein, ur: NEGATIVE mg/dL
Specific Gravity, Urine: 1.008 (ref 1.005–1.030)
pH: 5 (ref 5.0–8.0)

## 2022-11-03 LAB — COMPREHENSIVE METABOLIC PANEL
ALT: 14 U/L (ref 0–44)
AST: 16 U/L (ref 15–41)
Albumin: 3.6 g/dL (ref 3.5–5.0)
Alkaline Phosphatase: 55 U/L (ref 38–126)
Anion gap: 13 (ref 5–15)
BUN: 17 mg/dL (ref 8–23)
CO2: 22 mmol/L (ref 22–32)
Calcium: 10.1 mg/dL (ref 8.9–10.3)
Chloride: 106 mmol/L (ref 98–111)
Creatinine, Ser: 1.56 mg/dL — ABNORMAL HIGH (ref 0.44–1.00)
GFR, Estimated: 36 mL/min — ABNORMAL LOW (ref >60)
Glucose, Bld: 91 mg/dL (ref 70–99)
Potassium: 4.3 mmol/L (ref 3.5–5.1)
Sodium: 141 mmol/L (ref 135–145)
Total Bilirubin: 0.7 mg/dL (ref 0.3–1.2)
Total Protein: 6.5 g/dL (ref 6.5–8.1)

## 2022-11-03 LAB — CBC WITH DIFFERENTIAL/PLATELET
Abs Immature Granulocytes: 0.01 10*3/uL (ref 0.00–0.07)
Basophils Absolute: 0 10*3/uL (ref 0.0–0.1)
Basophils Relative: 1 %
Eosinophils Absolute: 0.2 10*3/uL (ref 0.0–0.5)
Eosinophils Relative: 3 %
HCT: 40.7 % (ref 36.0–46.0)
Hemoglobin: 13.1 g/dL (ref 12.0–15.0)
Immature Granulocytes: 0 %
Lymphocytes Relative: 17 %
Lymphs Abs: 1 10*3/uL (ref 0.7–4.0)
MCH: 30.3 pg (ref 26.0–34.0)
MCHC: 32.2 g/dL (ref 30.0–36.0)
MCV: 94.2 fL (ref 80.0–100.0)
Monocytes Absolute: 0.5 10*3/uL (ref 0.1–1.0)
Monocytes Relative: 8 %
Neutro Abs: 4 10*3/uL (ref 1.7–7.7)
Neutrophils Relative %: 71 %
Platelets: 185 10*3/uL (ref 150–400)
RBC: 4.32 MIL/uL (ref 3.87–5.11)
RDW: 12.3 % (ref 11.5–15.5)
WBC: 5.7 10*3/uL (ref 4.0–10.5)
nRBC: 0 % (ref 0.0–0.2)

## 2022-11-03 LAB — LIPASE, BLOOD: Lipase: 39 U/L (ref 11–51)

## 2022-11-03 MED ORDER — OXYCODONE HCL 5 MG PO TABS: 5.0000 mg | ORAL_TABLET | Freq: Four times a day (QID) | ORAL | 0 refills | Status: DC | PRN

## 2022-11-03 MED ORDER — ONDANSETRON 4 MG PO TBDP
4.0000 mg | ORAL_TABLET | Freq: Once | ORAL | Status: AC
  Administered 2022-11-03: 4 mg via ORAL
  Filled 2022-11-03: qty 1

## 2022-11-03 MED ORDER — ONDANSETRON HCL 4 MG PO TABS: 4.0000 mg | ORAL_TABLET | Freq: Three times a day (TID) | ORAL | Status: AC | PRN

## 2022-11-03 MED ORDER — KETOROLAC TROMETHAMINE 60 MG/2ML IM SOLN
30.0000 mg | Freq: Once | INTRAMUSCULAR | Status: AC
  Administered 2022-11-03: 30 mg via INTRAMUSCULAR
  Filled 2022-11-03: qty 2

## 2022-11-03 MED ORDER — OXYCODONE HCL 5 MG PO TABS: 5.0000 mg | ORAL_TABLET | ORAL | 0 refills | Status: DC | PRN

## 2022-11-03 MED ORDER — TAMSULOSIN HCL 0.4 MG PO CAPS: 0.4000 mg | ORAL_CAPSULE | Freq: Every day | ORAL | 0 refills | Status: AC

## 2022-11-03 NOTE — ED Provider Triage Note (Signed)
Emergency Medicine Provider Triage Evaluation Note  Sydney Ford , a 67 y.o. female  was evaluated in triage.  Pt complains of left flank pain for the past 3 days.  She has had associated nausea. States this feels like previous kidney stones.  Has been having increased urinary frequency.  Denies hematuria.  Denies any fevers or chills.  Denies any abdominal pain.  Review of Systems  Positive: As above Negative: As above  Physical Exam  BP (!) 193/101   Pulse 66   Temp 98.2 F (36.8 C)   Resp 16   SpO2 99%  Gen:   Awake, no distress   Resp:  Normal effort  MSK:   Moves extremities without difficulty  Other:  No paraspinal tenderness to palpation, no CVA tenderness  Medical Decision Making  Medically screening exam initiated at 2:03 PM.  Appropriate orders placed.  Rennis Golden Goswami was informed that the remainder of the evaluation will be completed by another provider, this initial triage assessment does not replace that evaluation, and the importance of remaining in the ED until their evaluation is complete.     Arabella Merles, PA-C 11/03/22 1420

## 2022-11-03 NOTE — ED Triage Notes (Signed)
Patient complains of left flank pain with radiation to groin. Patient has hx of stones. Alert and oriented, NAD.  Nausea with same

## 2022-11-03 NOTE — ED Provider Notes (Signed)
[place] EMERGENCY DEPARTMENT AT [facility] Provider Note   CSN: [id number] Arrival date & time: 11/03/22  1157     History  No chief complaint on file.   Sydney Ford is a 67 y.o. female.  This is a 67 year old female comes in today with flank pain.  Patient says that she has passed over 40 kidney stones.  She believes she has another 1.  No fever, no chills.        Home Medications Prior to Admission medications   Medication Sig Start Date End Date Taking? Authorizing Provider  ondansetron (ZOFRAN) 4 MG tablet Take 1 tablet (4 mg total) by mouth every 8 (eight) hours as needed for nausea or vomiting. 11/03/22  Yes Anders Simmonds T, DO  oxyCODONE (ROXICODONE) 5 MG immediate release tablet Take 1 tablet (5 mg total) by mouth every 6 (six) hours as needed for severe pain. 11/03/22  Yes Anders Simmonds T, DO  atorvastatin (LIPITOR) 20 MG tablet Take 20 mg by mouth every evening.  01/15/13   [provider]  b complex vitamins capsule Take 1 capsule by mouth daily.    [provider]  carvedilol (COREG) 6.25 MG tablet Take 6.25 mg by mouth 2 (two) times daily with a meal.    [provider]  Cholecalciferol (VITAMIN D3) 10 MCG (400 UNIT) CHEW Chew by mouth.    [provider]  irbesartan (AVAPRO) 300 MG tablet Take 300 mg by mouth daily.    [provider]  levothyroxine (SYNTHROID) 100 MCG tablet Take 100 mcg by mouth daily before breakfast.    [provider]  meloxicam (MOBIC) 15 MG tablet Take 15 mg by mouth daily.    [provider]  omeprazole (PRILOSEC) 40 MG capsule Take 40 mg by mouth daily.  01/16/14   [provider]  thyroid (ARMOUR) 90 MG tablet Take 90 mg by mouth every morning.    [provider]  traMADol (ULTRAM) 50 MG tablet Take 1-2 tablets (50-100 mg total) by mouth every 6 (six) hours as needed. 12/25/16   Magrinat, [place]ino Hue, MD  VIACTIV CALCIUM PLUS D  650-12.5-40 MG-MCG CHEW Chew by mouth daily.    [provider]      Allergies    Codeine, Compazine [prochlorperazine edisylate], Other, Latex, and Tape    Review of Systems   Review of Systems  Physical Exam Updated Vital Signs BP (!) 184/87   Pulse 60   Temp 97.6 F (36.4 C)   Resp 18   SpO2 100%  Physical Exam Constitutional:      Appearance: Normal appearance.  Cardiovascular:     Rate and Rhythm: Normal rate.  Pulmonary:     Effort: Pulmonary effort is normal.  Abdominal:     General: There is no distension.     Tenderness: There is no abdominal tenderness.  Neurological:     Mental Status: She is alert.     ED Results / Procedures / Treatments   Labs (all labs ordered are listed, but only abnormal results are displayed) Labs Reviewed  COMPREHENSIVE METABOLIC PANEL - Abnormal; Notable for the following components:      Result Value   Creatinine, Ser 1.56 (*)    GFR, Estimated 36 (*)    All other components within normal limits  URINALYSIS, ROUTINE W REFLEX MICROSCOPIC  CBC WITH DIFFERENTIAL/PLATELET  LIPASE, BLOOD    EKG None  Radiology CT Renal Stone Study  Result Date: 11/03/2022 CLINICAL  DATA:  Left flank pain and nausea for 3 days. Nephrolithiasis. EXAM: CT ABDOMEN AND PELVIS WITHOUT CONTRAST TECHNIQUE: Multidetector CT imaging of the abdomen and pelvis was performed following the standard protocol without IV contrast. RADIATION DOSE REDUCTION: This exam was performed according to the departmental dose-optimization program which includes automated exposure control, adjustment of the mA and/or kV according to patient size and/or use of iterative reconstruction technique. COMPARISON:  10/27/2019 FINDINGS: Lower chest: No acute findings. Hepatobiliary: No mass visualized on this unenhanced exam. Gallbladder is unremarkable. No evidence of biliary ductal dilatation. Pancreas: No mass or inflammatory process visualized on this unenhanced exam. Spleen:   Within normal limits in size. Adrenals/Urinary tract: Multiple small less than 1 cm renal calculi are again seen bilaterally. Mild-to-moderate left hydroureteronephrosis is seen due to two 4 mm calculi in the distal left ureter and at the left UVJ. Unremarkable unopacified urinary bladder. Stomach/Bowel: No evidence of obstruction, inflammatory process, or abnormal fluid collections. Normal appendix visualized. Diverticulosis is seen mainly involving the sigmoid colon, however there is no evidence of diverticulitis. Vascular/Lymphatic: No pathologically enlarged lymph nodes identified. No evidence of abdominal aortic aneurysm. Reproductive:  No mass or other significant abnormality. Other:  None. Musculoskeletal:  No suspicious bone lesions identified. IMPRESSION: Mild-to-moderate left hydroureteronephrosis due to two 4 mm calculi in distal left ureter and at the left UVJ. Bilateral nephrolithiasis. Colonic diverticulosis, without radiographic evidence of diverticulitis. Electronically Signed   By: Danae Orleans M.D.   On: 11/03/2022 16:48    Procedures Procedures    Medications Ordered in ED Medications  ketorolac (TORADOL) injection 30 mg (has no administration in time range)  ondansetron (ZOFRAN-ODT) disintegrating tablet 4 mg (has no administration in time range)    ED Course/ Medical Decision Making/ A&P                                 Medical Decision Making 67 year old female here today because she believes she has a kidney stone.  Plan -patient is imaging does show to 4 mm stones on the left.  Patient's creatinine is 1.5, 4 years ago it was 1.1.  No signs of infection in the urine.  Patient says that she has only had to have stones removed surgically 2 times.  She is confident in her ability to pass them.  Will discharge home.  She has a urologist that she follows up with.  Amount and/or Complexity of Data Reviewed Labs: ordered.           Final Clinical Impression(s) / ED  Diagnoses Final diagnoses:  Nephrolithiasis    Rx / DC Orders ED Discharge Orders          Ordered    ondansetron (ZOFRAN) 4 MG tablet  Every 8 hours PRN        11/03/22 2022    oxyCODONE (ROXICODONE) 5 MG immediate release tablet  Every 6 hours PRN        11/03/22 2022              Anders Simmonds T, DO 11/03/22 2023

## 2022-11-03 NOTE — Discharge Instructions (Addendum)
You can take tamsulosin each day.  I would recommend also taking Tylenol and ibuprofen.  You may use 5 mg of oxycodone every 6 hours as needed for severe breakthrough pain.  He can also take Zofran for nausea.  Please follow-up with your urologist.  Come back to the emergency department if you develop fever or chills.

## 2022-11-07 ENCOUNTER — Telehealth: Payer: Self-pay | Admitting: *Deleted

## 2022-11-07 NOTE — Telephone Encounter (Signed)
Transition Care Management Unsuccessful Follow-up Telephone Call  Date of discharge and from where:  [facility]   08/03/2022  Attempts:  1st Attempt  Reason for unsuccessful TCM follow-up call:  Left voice message

## 2022-11-24 DIAGNOSIS — E7800 Pure hypercholesterolemia, unspecified: Secondary | ICD-10-CM | POA: Diagnosis not present

## 2022-12-01 DIAGNOSIS — E039 Hypothyroidism, unspecified: Secondary | ICD-10-CM | POA: Diagnosis not present

## 2022-12-01 DIAGNOSIS — M810 Age-related osteoporosis without current pathological fracture: Secondary | ICD-10-CM | POA: Diagnosis not present

## 2022-12-01 DIAGNOSIS — I10 Essential (primary) hypertension: Secondary | ICD-10-CM | POA: Diagnosis not present

## 2022-12-01 DIAGNOSIS — E7800 Pure hypercholesterolemia, unspecified: Secondary | ICD-10-CM | POA: Diagnosis not present

## 2022-12-01 DIAGNOSIS — Z23 Encounter for immunization: Secondary | ICD-10-CM | POA: Diagnosis not present

## 2022-12-01 DIAGNOSIS — M170 Bilateral primary osteoarthritis of knee: Secondary | ICD-10-CM | POA: Diagnosis not present

## 2022-12-01 DIAGNOSIS — E559 Vitamin D deficiency, unspecified: Secondary | ICD-10-CM | POA: Diagnosis not present

## 2022-12-01 DIAGNOSIS — N200 Calculus of kidney: Secondary | ICD-10-CM | POA: Diagnosis not present

## 2022-12-10 DIAGNOSIS — M810 Age-related osteoporosis without current pathological fracture: Secondary | ICD-10-CM | POA: Diagnosis not present

## 2023-06-01 DIAGNOSIS — E039 Hypothyroidism, unspecified: Secondary | ICD-10-CM | POA: Diagnosis not present

## 2023-06-01 DIAGNOSIS — E559 Vitamin D deficiency, unspecified: Secondary | ICD-10-CM | POA: Diagnosis not present

## 2023-06-01 DIAGNOSIS — Z0000 Encounter for general adult medical examination without abnormal findings: Secondary | ICD-10-CM | POA: Diagnosis not present

## 2023-06-01 DIAGNOSIS — E7800 Pure hypercholesterolemia, unspecified: Secondary | ICD-10-CM | POA: Diagnosis not present

## 2023-06-08 DIAGNOSIS — M1712 Unilateral primary osteoarthritis, left knee: Secondary | ICD-10-CM | POA: Diagnosis not present

## 2023-06-08 DIAGNOSIS — I10 Essential (primary) hypertension: Secondary | ICD-10-CM | POA: Diagnosis not present

## 2023-06-08 DIAGNOSIS — N1831 Chronic kidney disease, stage 3a: Secondary | ICD-10-CM | POA: Diagnosis not present

## 2023-06-08 DIAGNOSIS — M810 Age-related osteoporosis without current pathological fracture: Secondary | ICD-10-CM | POA: Diagnosis not present

## 2023-06-08 DIAGNOSIS — E039 Hypothyroidism, unspecified: Secondary | ICD-10-CM | POA: Diagnosis not present

## 2023-06-08 DIAGNOSIS — E559 Vitamin D deficiency, unspecified: Secondary | ICD-10-CM | POA: Diagnosis not present

## 2023-06-08 DIAGNOSIS — Z0000 Encounter for general adult medical examination without abnormal findings: Secondary | ICD-10-CM | POA: Diagnosis not present

## 2023-06-08 DIAGNOSIS — E7800 Pure hypercholesterolemia, unspecified: Secondary | ICD-10-CM | POA: Diagnosis not present

## 2023-06-15 DIAGNOSIS — M810 Age-related osteoporosis without current pathological fracture: Secondary | ICD-10-CM | POA: Diagnosis not present

## 2023-06-18 ENCOUNTER — Other Ambulatory Visit (HOSPITAL_COMMUNITY): Payer: Self-pay | Admitting: Registered Nurse

## 2023-06-18 DIAGNOSIS — E7800 Pure hypercholesterolemia, unspecified: Secondary | ICD-10-CM

## 2023-07-01 ENCOUNTER — Ambulatory Visit (HOSPITAL_COMMUNITY)
Admission: RE | Admit: 2023-07-01 | Discharge: 2023-07-01 | Disposition: A | Discharge status: Home / Self Care | Payer: Self-pay | Source: Ambulatory Visit | Attending: Registered Nurse | Admitting: Registered Nurse

## 2023-07-01 DIAGNOSIS — E7800 Pure hypercholesterolemia, unspecified: Secondary | ICD-10-CM | POA: Insufficient documentation

## 2023-09-21 DIAGNOSIS — N200 Calculus of kidney: Secondary | ICD-10-CM | POA: Diagnosis not present

## 2023-10-02 ENCOUNTER — Encounter: Payer: Self-pay | Admitting: Advanced Practice Midwife

## 2023-10-12 NOTE — Progress Notes (Signed)
 Cardiology Office Note:  .   Date:  10/14/2023 ID:  Sydney, Ford [birth year], MRN [id number] PCP: Luke Agent, MD (Inactive) [facility] Providers Cardiologist:  None { Click to update primary MD,subspecialty MD or APP then REFRESH:1}   Patient Profile: .      PMH Dyslipidemia Morbid obesity Hypothyroidism Hypertension Coronary artery calcification CT Calcium  score 07/01/23 CAC Score 326 (89th percentile) LM 0, LAD 238, LCx 36, RCA 51 Elevated LP(a) (currently enrolled in study)       History of Present Illness: .   Sydney Ford is a *** 68 y.o. female  who is here today for new patient consult for *** Life stress Increased atorvastatin  to 40 mg in march   Family history: Her family history includes Autoimmune disease in her maternal aunt; Heart attack in her father, maternal grandfather, paternal grandfather, and paternal uncle; Heart failure in her maternal grandmother and mother; Schizophrenia (age of onset: 18) in her son; Stroke in her paternal uncle; Throat cancer in her paternal grandmother.   Discussed the use of AI scribe software for clinical note transcription with the patient, who gave verbal consent to proceed.  ASCVD Risk Score: ASCVD (Atherosclerotic Cardiovascular Disease) Risk Algorithm including Known ASCVD from AHA/ACC from [email or web address]  on 10/12/2023 ** All calculations should be rechecked by clinician prior to use **  RESULT SUMMARY: 14.8 % Risk of cardiovascular event (coronary or stroke death or non-fatal MI or stroke) in next 10 years.  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 No structured exercise  No results found for: LIPOA    ROS: ***       Studies Reviewed: SABRA   EKG Interpretation Date/Time:  Wednesday October 14 2023 14:46:23 EDT Ventricular Rate:  93 PR Interval:  142 QRS Duration:  66 QT Interval:  350 QTC Calculation: 435 R Axis:   57  Text Interpretation: Normal sinus rhythm Low voltage QRS When compared with ECG of 23-Mar-2013 11:39, No significant change was found Confirmed by Percy Browning [phone]) on 10/14/2023 2:57:06 PM      *** Risk Assessment/Calculations:             Physical Exam:   VS: BP 128/78 (BP Location: Right Arm, Patient Position: Sitting)   Pulse 93   Resp 16   Ht 5' (1.524 m)   Wt 157 lb (71.2 kg)   SpO2 95%   BMI 30.66 kg/m   Wt Readings from Last 3 Encounters:  10/14/23 157 lb (71.2 kg)  06/13/19 218 lb (98.9 kg)  01/19/19 220 lb 3.2 oz (99.9 kg)     GEN: Well nourished, well developed in no acute distress NECK: No JVD; No carotid bruits CARDIAC: ***RRR, no murmurs, rubs, gallops RESPIRATORY:  Clear to auscultation without rales, wheezing or rhonchi  ABDOMEN: Soft, non-tender, non-distended EXTREMITIES:  No edema; No deformity     ASSESSMENT AND PLAN: .    CV Risk Assessment  Plan/Goals:  Activity: ***      {Are you ordering a CV Procedure (e.g. stress  test, cath, DCCV, TEE, etc)?   Press F2        :[id number]}  Dispo: ***  Signed, Rosaline Bane, NP-C

## 2023-10-14 ENCOUNTER — Ambulatory Visit (HOSPITAL_BASED_OUTPATIENT_CLINIC_OR_DEPARTMENT_OTHER): Admitting: Nurse Practitioner

## 2023-10-14 ENCOUNTER — Encounter (HOSPITAL_BASED_OUTPATIENT_CLINIC_OR_DEPARTMENT_OTHER): Payer: Self-pay | Admitting: Nurse Practitioner

## 2023-10-14 VITALS — BP 128/78 | HR 93 | Resp 16 | Ht 60.0 in | Wt 157.0 lb

## 2023-10-14 DIAGNOSIS — I2510 Atherosclerotic heart disease of native coronary artery without angina pectoris: Secondary | ICD-10-CM

## 2023-10-14 DIAGNOSIS — R0609 Other forms of dyspnea: Secondary | ICD-10-CM | POA: Diagnosis not present

## 2023-10-14 DIAGNOSIS — E785 Hyperlipidemia, unspecified: Secondary | ICD-10-CM | POA: Diagnosis not present

## 2023-10-14 DIAGNOSIS — E7841 Elevated Lipoprotein(a): Secondary | ICD-10-CM | POA: Diagnosis not present

## 2023-10-14 MED ORDER — METOPROLOL TARTRATE 50 MG PO TABS: ORAL_TABLET | ORAL | 0 refills | Status: DC

## 2023-10-14 NOTE — Patient Instructions (Signed)
 Medication Instructions:   Your physician recommends that you continue on your current medications as directed. Please refer to the Current Medication list given to you today.   *If you need a refill on your cardiac medications before your next appointment, please call your pharmacy*  Lab Work:  None ordered.  If you have labs (blood work) drawn today and your tests are completely normal, you will receive your results only by: MyChart Message (if you have MyChart) OR A paper copy in the mail If you have any lab test that is abnormal or we need to change your treatment, we will call you to review the results.  Testing/Procedures:    Your cardiac CT will be scheduled at one of the below locations:   [facility] [address]  [place], [place] [address]  If scheduled at the [facility] and [facility] at [address], please enter the parking lot using the [address] entrance and use the FREE valet service at the patient drop-off area. Enter the building and check-in with registration on the main floor.   Please follow these instructions carefully (unless otherwise directed):  An IV will be required for this test and Nitroglycerin will be given.   On the Night Before the Test: Be sure to Drink plenty of water. Do not consume any caffeinated/decaffeinated beverages or chocolate 12 hours prior to your test. Do not take any antihistamines 12 hours prior to your test. If the patient has contrast allergy:  On the Day of the Test: Drink plenty of water until 1 hour prior to the test. Do not eat any food 1 hour prior to test. You may take your regular medications prior to the test.  Take metoprolol  (Lopressor ) two hours prior to test. FEMALES- please wear underwire-free bra if available, avoid dresses & tight clothing  *For Clinical Staff only. Please instruct patient the following:*     After the Test: Drink plenty of water. After receiving  IV contrast, you may experience a mild flushed feeling. This is normal. On occasion, you may experience a mild rash up to 24 hours after the test. This is not dangerous. If this occurs, you can take Benadryl  25 mg, Zyrtec, Claritin, or Allegra and increase your fluid intake. (Patients taking Tikosyn should avoid Benadryl , and may take Zyrtec, Claritin, or Allegra) If you experience trouble breathing, this can be serious. If it is severe call 911 IMMEDIATELY. If it is mild, please call our office.  We will call to schedule your test 2-4 weeks out understanding that some insurance companies will need an authorization prior to the service being performed.   For more information and frequently asked questions, please visit our website : [email or web address]  For non-scheduling related questions, please contact the cardiac imaging nurse navigator should you have any questions/concerns: Cardiac Imaging Nurse Navigators Direct Office Dial: [phone]   For scheduling needs, including cancellations and rescheduling, please call [place], [phone].   Follow-Up: At [facility], you and your health needs are our priority.  As part of our continuing mission to provide you with exceptional heart care, our providers are all part of one team.  This team includes your primary Cardiologist (physician) and Advanced Practice Providers or APPs (Physician Assistants and Nurse Practitioners) who all work together to provide you with the care you need, when you need it.  Your next appointment:   4 month(s)  Provider:   Rosaline Bane, NP    We recommend signing up  for the patient portal called MyChart.  Sign up information is provided on this After Visit Summary.  MyChart is used to connect with patients for Virtual Visits (Telemedicine).  Patients are able to view lab/test results, encounter notes, upcoming appointments, etc.  Non-urgent messages can be sent to your provider as  well.   To learn more about what you can do with MyChart, go to [email or web address].

## 2023-10-15 ENCOUNTER — Encounter (HOSPITAL_BASED_OUTPATIENT_CLINIC_OR_DEPARTMENT_OTHER): Payer: Self-pay | Admitting: Nurse Practitioner

## 2023-10-20 ENCOUNTER — Encounter (HOSPITAL_COMMUNITY): Payer: Self-pay

## 2023-10-23 ENCOUNTER — Telehealth (HOSPITAL_COMMUNITY): Payer: Self-pay | Admitting: Emergency Medicine

## 2023-10-23 NOTE — Telephone Encounter (Signed)
 Attempted to call patient regarding upcoming cardiac CT appointment. Left message on voicemail with name and callback number Rockwell Alexandria RN Navigator Cardiac Imaging [facility] [phone] Office [phone] Cell

## 2023-10-23 NOTE — Telephone Encounter (Signed)
 Reaching out to patient to offer assistance regarding upcoming cardiac imaging study; pt verbalizes understanding of appt date/time, parking situation and where to check in, pre-test NPO status and medications ordered, and verified current allergies; name and call back number provided for further questions should they arise Rockwell Alexandria RN Navigator Cardiac Imaging [facility] [phone] office [phone] cell

## 2023-10-26 ENCOUNTER — Ambulatory Visit (HOSPITAL_COMMUNITY)
Admission: RE | Admit: 2023-10-26 | Discharge: 2023-10-26 | Disposition: A | Discharge status: Home / Self Care | Source: Ambulatory Visit | Attending: Cardiovascular Disease | Admitting: Cardiovascular Disease

## 2023-10-26 DIAGNOSIS — Q2112 Patent foramen ovale: Secondary | ICD-10-CM | POA: Insufficient documentation

## 2023-10-26 DIAGNOSIS — R9430 Abnormal result of cardiovascular function study, unspecified: Secondary | ICD-10-CM

## 2023-10-26 DIAGNOSIS — I2510 Atherosclerotic heart disease of native coronary artery without angina pectoris: Secondary | ICD-10-CM | POA: Diagnosis not present

## 2023-10-26 MED ORDER — IOHEXOL 350 MG/ML SOLN
100.0000 mL | Freq: Once | INTRAVENOUS | Status: AC | PRN
  Administered 2023-10-26 (×2): 100 mL via INTRAVENOUS

## 2023-10-26 MED ORDER — NITROGLYCERIN 0.4 MG SL SUBL
0.8000 mg | SUBLINGUAL_TABLET | Freq: Once | SUBLINGUAL | Status: AC
  Administered 2023-10-26 (×2): 0.8 mg via SUBLINGUAL

## 2023-10-27 ENCOUNTER — Ambulatory Visit: Payer: Self-pay | Admitting: Nurse Practitioner

## 2023-12-07 DIAGNOSIS — E039 Hypothyroidism, unspecified: Secondary | ICD-10-CM | POA: Diagnosis not present

## 2023-12-07 DIAGNOSIS — E7800 Pure hypercholesterolemia, unspecified: Secondary | ICD-10-CM | POA: Diagnosis not present

## 2023-12-14 DIAGNOSIS — E039 Hypothyroidism, unspecified: Secondary | ICD-10-CM | POA: Diagnosis not present

## 2023-12-14 DIAGNOSIS — Z23 Encounter for immunization: Secondary | ICD-10-CM | POA: Diagnosis not present

## 2023-12-14 DIAGNOSIS — E559 Vitamin D deficiency, unspecified: Secondary | ICD-10-CM | POA: Diagnosis not present

## 2023-12-14 DIAGNOSIS — E7800 Pure hypercholesterolemia, unspecified: Secondary | ICD-10-CM | POA: Diagnosis not present

## 2023-12-14 DIAGNOSIS — I10 Essential (primary) hypertension: Secondary | ICD-10-CM | POA: Diagnosis not present

## 2023-12-14 DIAGNOSIS — N1831 Chronic kidney disease, stage 3a: Secondary | ICD-10-CM | POA: Diagnosis not present

## 2023-12-14 DIAGNOSIS — M1712 Unilateral primary osteoarthritis, left knee: Secondary | ICD-10-CM | POA: Diagnosis not present

## 2023-12-16 DIAGNOSIS — M810 Age-related osteoporosis without current pathological fracture: Secondary | ICD-10-CM | POA: Diagnosis not present

## 2024-01-18 NOTE — Progress Notes (Signed)
 " Cardiology Office Note:  .   Date:  01/25/2024 ID:  Sydney Ford, DOB 12-22-1955, MRN [id number] PCP: Royden Ronal Czar, FNP [facility] Providers Cardiologist:  None   Patient Profile: .      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 She is participating in a study for elevated LP(a) and attributes subtle changes to the study medication. Her CT calcium  score is 326. LP(a) approximately 300, but cannot test these levels during the study. She works as a financial trader, limiting herself to one house per day, about four hours of work. She experiences fatigue and shortness of breath with exertion but no chest discomfort during physical activity. She has episodes of increased heart rate related to acute stress from her schizophrenic child who is homeless. Her dietary intake is minimal, typically one -two meals per day, feeling full after eating a small amount. She experiences nausea with certain foods and supplements, limiting her intake. 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 Current medications include atorvastatin  40 mg daily, which was increased following CT and aspirin 81 mg daily. She previously took various supplements but discontinued them due to nausea. No chest pain, palpitations, presyncope, syncope, orthopnea, PND, edema. She reports a history of a childhood heart murmur, which resolved. She monitors her blood pressure and heart rate at home, noting improvements after adjusting her thyroid  medication. Family history significant for MI in father. ASCVD risk score 14.8%. EKG revealed NSR at 93  bpm, low voltage QRS.  Due to concern for ischemia, coronary CTA was completed 10/26/23 which revealed minimal CAD < 25% stenosis, severe TPV, PFO with left to right shunt. She was advised to continue aspirin and lifestyle modification with goal LDL 70 or lower.        History of Present Illness: .    Discussed the use of AI scribe software for clinical note transcription with the patient, who gave verbal consent to proceed.  History of Present Illness Sydney Ford is a very plesant 68 year old female who presents for follow-up of CAD and dyslipidemia. 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  She feels that fatigue she experiences is expected given her lifestyle and age. She has achieved significant weight loss by changing her diet and being more active.  She is somewhat physically limited by lack of cartilage in her knees and shoulders but she tries to continue regular weightlifting and cardio exercise.  She is compliant with taking atorvastatin  40 mg daily, however lipid levels have worsened recently.  This concerns were because she is participating in a study on LP(a) more concerned about her own health and therefore will consider further lipid-lowering therapy.  We reviewed her diet which is overall healthy and in fact she probably does not consume enough calories in a day.  She denies chest pain, dyspnea, orthopnea, PND, edema, palpitations, presyncope, syncope.  We reviewed results of her coronary CTA in detail.  ROS: See HPI       Studies Reviewed: .          Risk Assessment/Calculations:  Physical Exam:   VS: BP (!) 98/58 (BP Location: Right Arm, Patient Position: Sitting, Cuff Size: Normal)   Pulse 90   Ht 5' 2 (1.575 m)   Wt 147 lb (66.7 kg)   SpO2 94%   BMI 26.89 kg/m   Wt Readings from Last 3 Encounters:  01/25/24 147 lb (66.7 kg)  10/14/23 157 lb (71.2 kg)  06/13/19 218 lb (98.9 kg)      GEN: Well nourished, well developed in no acute distress NECK: No JVD; No carotid bruits CARDIAC: RRR, no murmurs, rubs, gallops RESPIRATORY:  Clear to auscultation without rales, wheezing or rhonchi  ABDOMEN: Soft, non-tender, non-distended EXTREMITIES:  No edema; No deformity     ASSESSMENT AND PLAN: .    Assessment & Plan Coronary artery disease PFO Cardiac risk assessment CT calcium  score 326 (89th percentile) on 07/01/23. Coronary CTA completed 10/26/2023 revealed mild nonobstructive coronary artery disease.  Additionally, she was noted to have a PFO. She is asymptomatic. She had already been started on aspirin 81 mg daily. She is active with house cleaning and gym workouts and denies chest pain, dyspnea, or other symptoms concerning for angina.  No indication for further ischemic evaluation at this time. BP is well controlled. LDL is not at goal, see plan below.  - Continue aspirin, atorvastatin , irbesartan -Encouraged high protein, high fiber, whole food diet with small incremental meals due to early satiety and frequent nausea -Continue regular physical activity and report if symptoms worsen  Hypertension BP is well controlled, somewhat soft at times. She is asymptomatic. Stable renal function on labs completed 01/15/2024. -Continue irbesartan  Elevated lipoprotein(a) Hyperlipidemia LDL goal < 55 Participating in an LDL study with an initial LP(a) of approximately 300 (although she cannot recall exact number). Lipid panel completed 12/07/2023 with total cholesterol 290, HDL 80, LDL 181, and triglycerides 161.  LDL goal is < 55 given hx of elevated LP(a) and CAD. She is concerned about increase in LDL up from 137 in March despite compliance with daily atorvastatin . No recent LP(a) labs due to enrollment in research study.  We discussed additional lipid-lowering therapies including PCSK9 inhibitor and bempedoic acid.  She would like to seek pricing on PCSK9 inhibitor. - Submit PA  request on PCSK9 through pharmacy team - Will notify her of pricing -Continue atorvastatin  40 mg daily - Continue heart healthy diet avoiding processed foods, saturated fat, sugar and other simple carbohydrates - Try to increase dietary fiber     Dispo: 1 year with me  Signed, Rosaline Bane, NP-C "

## 2024-01-25 ENCOUNTER — Ambulatory Visit (HOSPITAL_BASED_OUTPATIENT_CLINIC_OR_DEPARTMENT_OTHER): Admitting: Nurse Practitioner

## 2024-01-25 ENCOUNTER — Encounter (HOSPITAL_BASED_OUTPATIENT_CLINIC_OR_DEPARTMENT_OTHER): Payer: Self-pay | Admitting: Nurse Practitioner

## 2024-01-25 VITALS — BP 98/58 | HR 90 | Ht 62.0 in | Wt 147.0 lb

## 2024-01-25 DIAGNOSIS — E785 Hyperlipidemia, unspecified: Secondary | ICD-10-CM

## 2024-01-25 DIAGNOSIS — Q2112 Patent foramen ovale: Secondary | ICD-10-CM

## 2024-01-25 DIAGNOSIS — Z7189 Other specified counseling: Secondary | ICD-10-CM

## 2024-01-25 DIAGNOSIS — I10 Essential (primary) hypertension: Secondary | ICD-10-CM

## 2024-01-25 DIAGNOSIS — I2510 Atherosclerotic heart disease of native coronary artery without angina pectoris: Secondary | ICD-10-CM | POA: Diagnosis not present

## 2024-01-25 DIAGNOSIS — E7841 Elevated Lipoprotein(a): Secondary | ICD-10-CM | POA: Diagnosis not present

## 2024-01-25 NOTE — Patient Instructions (Signed)
 Medication Instructions:   Will be in touch about the PCSK9 injections.   *If you need a refill on your cardiac medications before your next appointment, please call your pharmacy*  Lab Work:  If you get the injectable medication will be in touch about future labs.  If you have labs (blood work) drawn today and your tests are completely normal, you will receive your results only by: MyChart Message (if you have MyChart) OR A paper copy in the mail If you have any lab test that is abnormal or we need to change your treatment, we will call you to review the results.  Testing/Procedures:  None ordered.   Follow-Up: At [facility], you and your health needs are our priority.  As part of our continuing mission to provide you with exceptional heart care, our providers are all part of one team.  This team includes your primary Cardiologist (physician) and Advanced Practice Providers or APPs (Physician Assistants and Nurse Practitioners) who all work together to provide you with the care you need, when you need it.  Your next appointment:   1 year(s)  Provider:   Rosaline Bane, NP    We recommend signing up for the patient portal called MyChart.  Sign up information is provided on this After Visit Summary.  MyChart is used to connect with patients for Virtual Visits (Telemedicine).  Patients are able to view lab/test results, encounter notes, upcoming appointments, etc.  Non-urgent messages can be sent to your provider as well.   To learn more about what you can do with MyChart, go to [email or web address].   Other Instructions  Your physician wants you to follow-up in: 1 year.  You will receive a reminder letter in the mail two months in advance. If you don't receive a letter, please call our office to schedule the follow-up appointment.

## 2024-01-26 ENCOUNTER — Encounter (HOSPITAL_BASED_OUTPATIENT_CLINIC_OR_DEPARTMENT_OTHER): Payer: Self-pay | Admitting: Nurse Practitioner

## 2024-01-26 ENCOUNTER — Telehealth: Payer: Self-pay | Admitting: Pharmacy Technician

## 2024-01-26 ENCOUNTER — Telehealth: Payer: Self-pay | Admitting: Nurse Practitioner

## 2024-01-26 ENCOUNTER — Other Ambulatory Visit (HOSPITAL_COMMUNITY): Payer: Self-pay

## 2024-01-26 NOTE — Telephone Encounter (Signed)
 Pharmacy Patient Advocate Encounter   Received notification from Physician's Office that prior authorization for Repatha is required/requested.   Insurance verification completed.   The patient is insured through [facility].   Per test claim: PA required; PA submitted to above mentioned insurance via Latent Key/confirmation #/EOC [id number] Status is pending

## 2024-01-26 NOTE — Telephone Encounter (Signed)
 Would you please check pricing for PCSK9i for this pt with elevated LP(a) and LDL not at goal on high intensity statin. She would prefer Repatha or Praluent but would consider Leqvio if price is better.  Thank you

## 2024-01-26 NOTE — Telephone Encounter (Signed)
 Pharmacy Patient Advocate Encounter  Received notification from [employer] that Prior Authorization for Repatha has been APPROVED from 01/26/24 to 07/25/24. Ran test claim, Copay is $199.63- one month. This test claim was processed through [facility]- copay amounts may vary at other pharmacies due to pharmacy/plan contracts, or as the patient moves through the different stages of their insurance plan.   PA #/Case ID/Reference #: [id number]

## 2024-01-28 ENCOUNTER — Other Ambulatory Visit (HOSPITAL_COMMUNITY): Payer: Self-pay

## 2024-01-28 NOTE — Telephone Encounter (Signed)
 Left vm to see if pt wanted to start PCSk9.

## 2024-01-28 NOTE — Telephone Encounter (Signed)
 Would it be cheaper elsewhere, or does she have a plan that requires her to meet a deductible.

## 2024-01-28 NOTE — Telephone Encounter (Signed)
 Please notify patient that the cost of Repatha is approximately $200 per month until her deductible (probably $2000) is met. If this is not feasible, she can consider changing plans for 2026 and finding a plan that has better coverage for Repatha. We can also see if she qualifies for a [employer] grant.

## 2024-02-03 ENCOUNTER — Encounter (HOSPITAL_BASED_OUTPATIENT_CLINIC_OR_DEPARTMENT_OTHER): Payer: Self-pay | Admitting: *Deleted

## 2024-02-04 ENCOUNTER — Telehealth: Payer: Self-pay | Admitting: Pharmacy Technician

## 2024-02-04 ENCOUNTER — Other Ambulatory Visit (HOSPITAL_COMMUNITY): Payer: Self-pay

## 2024-02-04 NOTE — Telephone Encounter (Signed)
 [employer] grant application started 02/03/24. See Mychart encounter.   Sydney Shilling S Kayzlee Wirtanen, NP

## 2024-02-04 NOTE — Telephone Encounter (Signed)
 Patient Advocate Encounter   The patient was approved for a [employer] grant that will help cover the cost of REPATHA Total amount awarded, 2500.  Effective: 01/05/24 - 01/03/25   APW:[id number] ERW:[id number] Hmnle:[id number] PI:[id number] [employer] ID: [id number]    Pharmacy provided with approval and processing information. Patient informed via mychart

## 2024-02-15 ENCOUNTER — Other Ambulatory Visit (HOSPITAL_BASED_OUTPATIENT_CLINIC_OR_DEPARTMENT_OTHER): Payer: Self-pay | Admitting: Nurse Practitioner

## 2024-02-17 ENCOUNTER — Other Ambulatory Visit (HOSPITAL_BASED_OUTPATIENT_CLINIC_OR_DEPARTMENT_OTHER): Payer: Self-pay | Admitting: *Deleted

## 2024-02-17 MED ORDER — REPATHA SURECLICK 140 MG/ML ~~LOC~~ SOAJ: 140.0000 mg | SUBCUTANEOUS | 3 refills | Status: DC

## 2024-02-17 MED ORDER — REPATHA SURECLICK 140 MG/ML ~~LOC~~ SOAJ: 140.0000 mg | SUBCUTANEOUS | 3 refills | Status: AC

## 2024-02-18 ENCOUNTER — Emergency Department (HOSPITAL_COMMUNITY)

## 2024-02-18 ENCOUNTER — Emergency Department (HOSPITAL_COMMUNITY)
Admission: EM | Admit: 2024-02-18 | Discharge: 2024-02-18 | Disposition: A | Discharge status: Home / Self Care | Attending: Emergency Medicine | Admitting: Emergency Medicine

## 2024-02-18 DIAGNOSIS — Z79899 Other long term (current) drug therapy: Secondary | ICD-10-CM | POA: Insufficient documentation

## 2024-02-18 DIAGNOSIS — S0101XA Laceration without foreign body of scalp, initial encounter: Secondary | ICD-10-CM | POA: Diagnosis not present

## 2024-02-18 DIAGNOSIS — S0993XA Unspecified injury of face, initial encounter: Secondary | ICD-10-CM | POA: Diagnosis present

## 2024-02-18 DIAGNOSIS — Z7982 Long term (current) use of aspirin: Secondary | ICD-10-CM | POA: Diagnosis not present

## 2024-02-18 DIAGNOSIS — S0181XA Laceration without foreign body of other part of head, initial encounter: Secondary | ICD-10-CM | POA: Insufficient documentation

## 2024-02-18 DIAGNOSIS — Z91040 Latex allergy status: Secondary | ICD-10-CM | POA: Diagnosis not present

## 2024-02-18 LAB — COMPREHENSIVE METABOLIC PANEL WITH GFR
ALT: 21 U/L (ref 0–44)
AST: 24 U/L (ref 15–41)
Albumin: 2.7 g/dL — ABNORMAL LOW (ref 3.5–5.0)
Alkaline Phosphatase: 58 U/L (ref 38–126)
Anion gap: 11 (ref 5–15)
BUN: 17 mg/dL (ref 8–23)
CO2: 23 mmol/L (ref 22–32)
Calcium: 8.3 mg/dL — ABNORMAL LOW (ref 8.9–10.3)
Chloride: 104 mmol/L (ref 98–111)
Creatinine, Ser: 1.54 mg/dL — ABNORMAL HIGH (ref 0.44–1.00)
GFR, Estimated: 36 mL/min — ABNORMAL LOW (ref >60)
Glucose, Bld: 87 mg/dL (ref 70–99)
Potassium: 4.2 mmol/L (ref 3.5–5.1)
Sodium: 138 mmol/L (ref 135–145)
Total Bilirubin: 1.1 mg/dL (ref 0.0–1.2)
Total Protein: 5.4 g/dL — ABNORMAL LOW (ref 6.5–8.1)

## 2024-02-18 LAB — CBC
HCT: 39.1 % (ref 36.0–46.0)
Hemoglobin: 13.1 g/dL (ref 12.0–15.0)
MCH: 31.1 pg (ref 26.0–34.0)
MCHC: 33.5 g/dL (ref 30.0–36.0)
MCV: 92.9 fL (ref 80.0–100.0)
Platelets: 223 K/uL (ref 150–400)
RBC: 4.21 MIL/uL (ref 3.87–5.11)
RDW: 13.2 % (ref 11.5–15.5)
WBC: 6.3 K/uL (ref 4.0–10.5)
nRBC: 0 % (ref 0.0–0.2)

## 2024-02-18 LAB — I-STAT CHEM 8, ED
BUN: 19 mg/dL (ref 8–23)
Calcium, Ion: 1.04 mmol/L — ABNORMAL LOW (ref 1.15–1.40)
Chloride: 105 mmol/L (ref 98–111)
Creatinine, Ser: 1.6 mg/dL — ABNORMAL HIGH (ref 0.44–1.00)
Glucose, Bld: 86 mg/dL (ref 70–99)
HCT: 38 % (ref 36.0–46.0)
Hemoglobin: 12.9 g/dL (ref 12.0–15.0)
Potassium: 4.3 mmol/L (ref 3.5–5.1)
Sodium: 137 mmol/L (ref 135–145)
TCO2: 22 mmol/L (ref 22–32)

## 2024-02-18 LAB — PROTIME-INR
INR: 1 (ref 0.8–1.2)
Prothrombin Time: 13.5 s (ref 11.4–15.2)

## 2024-02-18 LAB — I-STAT CG4 LACTIC ACID, ED: Lactic Acid, Venous: 1.6 mmol/L (ref 0.5–1.9)

## 2024-02-18 LAB — SAMPLE TO BLOOD BANK

## 2024-02-18 LAB — ETHANOL: Alcohol, Ethyl (B): <15 mg/dL (ref <15)

## 2024-02-18 MED ORDER — DIPHENHYDRAMINE HCL 50 MG/ML IJ SOLN
25.0000 mg | Freq: Once | INTRAMUSCULAR | Status: AC
  Administered 2024-02-18: 25 mg via INTRAVENOUS
  Filled 2024-02-18: qty 1

## 2024-02-18 MED ORDER — SODIUM CHLORIDE 0.9 % IV BOLUS
1000.0000 mL | Freq: Once | INTRAVENOUS | Status: AC
  Administered 2024-02-18: 1000 mL via INTRAVENOUS

## 2024-02-18 MED ORDER — LIDOCAINE-EPINEPHRINE (PF) 2 %-1:200000 IJ SOLN
10.0000 mL | Freq: Once | INTRAMUSCULAR | Status: AC
  Administered 2024-02-18: 10 mL via INTRADERMAL
  Filled 2024-02-18: qty 20

## 2024-02-18 MED ORDER — METOCLOPRAMIDE HCL 5 MG/ML IJ SOLN
10.0000 mg | Freq: Once | INTRAMUSCULAR | Status: AC
  Administered 2024-02-18: 10 mg via INTRAVENOUS
  Filled 2024-02-18: qty 2

## 2024-02-18 NOTE — Progress Notes (Signed)
 This chaplain responded to Level 2 trauma with the medical team. The chaplain added a calm, listening presence as the Pt. son-James visited and offered support at the bedside.  This chaplain is available for F/U spiritual care as needed.  Chaplain Leeroy Hummer [phone]
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
## 2024-02-18 NOTE — ED Notes (Signed)
 CCMD called by this RN
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
## 2024-02-18 NOTE — Discharge Instructions (Signed)
 Return for redness drainage or if you get a fever.  The sutures that were used are dissolvable that should dissolve between day 3 and day 5.  If they are still there after day 7 then you can gently plucked them out with tweezers.  Staples placed should be removed about day 7.  This can be done here at urgent care  or at your family doctor's office.  The area can get wet but not fully immersed underwater.  No scrubbing.  If you really want to clean it you can apply a half-and-half hydrogen peroxide solution with water on a Q-tip.  You can apply an ointment a couple times a day this could be as simple as Vaseline but could also be an antibiotic ointment if you wish.  Once it is healed please try to avoid prolonged sun exposure use sunscreen.  Gells that have silicone antigens have been shown to reduce scarring in some research.

## 2024-02-18 NOTE — ED Notes (Incomplete)
 Trauma Response Nurse Documentation   Sydney Ford is a 68 y.o. female arriving to [facility] ED via [facility]  On No antithrombotic. Trauma was activated as a Level 2 by charge RN based on the following trauma criteria Discretion of Emergency Department Physician.  Patient cleared for CT by Dr. Emil. Pt transported to CT with Primary nurse present to monitor. RN remained with the patient throughout their absence from the department for clinical observation.   GCS 15.  History   Past Medical History:  Diagnosis Date   Anemia    2005 - related to heavy menses with the thyroid  probl   Arthritis    oa; lower back pain - hx of injury yrs ago-occas flare ups   Breast cancer (HCC) 02/28/2013   right, 11 o'clock   Cancer (HCC) 2014   new dx of right breast cancer - planning chemo first   Chronic kidney disease 2000   kidney stone   Cold (disease) 03/21/2012   couple of weeks ago - pt states much better now   Depression    GERD (gastroesophageal reflux disease)    no meds except occas tums   Headache(784.0)    occas migraine   History of kidney stones    passed 9 stones   Hx of radiation therapy 11/16/13- 12/21/13   right breast 5000 cGy in 25 sessions   Hyperlipidemia    Hypertension 2009   Hypothyroidism    hashimoto's   Osteoporosis    Personal history of chemotherapy    Personal history of radiation therapy    Pneumonia    2014   Thyroid  disease 2000     Past Surgical History:  Procedure Laterality Date   AXILLARY SENTINEL NODE BIOPSY Right 09/01/2013   Procedure: RIGHT AXILLARY SENTINEL NODE BIOPSY;  Surgeon: Donnice Bury, MD;  Location: [facility];  Service: General;  Laterality: Right;  PECTORAL BLOCK WITH ANESTHESIA    BREAST LUMPECTOMY Right 2015   BREAST LUMPECTOMY WITH RADIOACTIVE SEED LOCALIZATION Right 09/01/2013   Procedure: BREAST LUMPECTOMY WITH RADIOACTIVE SEED LOCALIZATION;  Surgeon: Donnice Bury, MD;   Location: [facility];  Service: General;  Laterality: Right;   CESAREAN SECTION  1987   CYSTOSCOPY WITH RETROGRADE PYELOGRAM, URETEROSCOPY AND STENT PLACEMENT Right 06/30/2013   Procedure: CYSTOSCOPY WITH RETROGRADE PYELOGRAM, URETEROSCOPY, STONE EXTRACTION AND  STENT PLACEMENT;  Surgeon: Garnette Shack, MD;  Location: [facility];  Service: Urology;  Laterality: Right;   EXTRACORPOREAL SHOCK WAVE LITHOTRIPSY Left 06/13/2019   Procedure: EXTRACORPOREAL SHOCK WAVE LITHOTRIPSY (ESWL);  Surgeon: Carolee Sherwood JONETTA DOUGLAS, MD;  Location: [facility];  Service: Urology;  Laterality: Left;   HOLMIUM LASER APPLICATION Right 06/30/2013   Procedure: HOLMIUM LASER APPLICATION;  Surgeon: Garnette Shack, MD;  Location: [facility];  Service: Urology;  Laterality: Right;   PORT-A-CATH REMOVAL Left 09/01/2013   Procedure: REMOVAL PORT-A-CATH;  Surgeon: Donnice Bury, MD;  Location: [facility];  Service: General;  Laterality: Left;   PORTACATH PLACEMENT N/A 03/23/2013   Procedure: INSERTION PORT-A-CATH;  Surgeon: Donnice Bury, MD;  Location: [facility];  Service: General;  Laterality: N/A;   TONSILLECTOMY     uterine ablation  2008   no menstrual periods since procedure       Initial Focused Assessment (If applicable, or please see trauma documentation): Airway - clear Breathing - unlabored CIrculation -   CT's Completed:   {Trauma CT:26866}   Interventions:   Plan for disposition:  {  Trauma Dispo:26867}   Consults completed:  {Trauma Consults:26862} at ***.  Event Summary:  MTP Summary (If applicable):   Bedside handoff with {Trauma handoff:26863::ED RN} ***.    Darice HERO Zurri Rudden  Trauma Response RN  Please call TRN at [phone] for further assistance.
